# Patient Record
Sex: Female | Born: 1960
Health system: Southern US, Community
[De-identification: ages and names within clinical notes are randomized; demographics above are authoritative.]

## PROBLEM LIST (undated history)

## (undated) DIAGNOSIS — L719 Rosacea, unspecified: Secondary | ICD-10-CM

## (undated) DIAGNOSIS — I10 Essential (primary) hypertension: Secondary | ICD-10-CM

## (undated) DIAGNOSIS — E039 Hypothyroidism, unspecified: Secondary | ICD-10-CM

## (undated) DIAGNOSIS — E785 Hyperlipidemia, unspecified: Secondary | ICD-10-CM

## (undated) DIAGNOSIS — E079 Disorder of thyroid, unspecified: Secondary | ICD-10-CM

## (undated) DIAGNOSIS — F419 Anxiety disorder, unspecified: Secondary | ICD-10-CM

## (undated) DIAGNOSIS — E119 Type 2 diabetes mellitus without complications: Secondary | ICD-10-CM

## (undated) HISTORY — PX: BREAST SURGERY: SHX581

## (undated) HISTORY — DX: Disorder of thyroid, unspecified: E07.9

## (undated) HISTORY — PX: APPENDECTOMY: SHX54

## (undated) HISTORY — PX: KNEE ARTHROSCOPY W/ MENISCAL REPAIR: SHX1877

## (undated) HISTORY — DX: Hypothyroidism, unspecified: E03.9

## (undated) HISTORY — PX: HERNIA REPAIR: SHX51

## (undated) HISTORY — DX: Anxiety disorder, unspecified: F41.9

## (undated) HISTORY — PX: ABDOMINAL HYSTERECTOMY: SHX81

## (undated) HISTORY — DX: Rosacea, unspecified: L71.9

## (undated) HISTORY — DX: Essential (primary) hypertension: I10

## (undated) HISTORY — DX: Hyperlipidemia, unspecified: E78.5

## (undated) HISTORY — DX: Type 2 diabetes mellitus without complications: E11.9

---

## 1998-04-16 DIAGNOSIS — E079 Disorder of thyroid, unspecified: Secondary | ICD-10-CM

## 1998-04-16 HISTORY — DX: Disorder of thyroid, unspecified: E07.9

## 2004-04-16 HISTORY — PX: BREAST BIOPSY: SHX20

## 2004-06-01 ENCOUNTER — Ambulatory Visit: Payer: Self-pay | Admitting: Family Medicine

## 2004-07-04 ENCOUNTER — Ambulatory Visit: Payer: Self-pay | Admitting: Family Medicine

## 2005-02-15 ENCOUNTER — Ambulatory Visit: Payer: Self-pay

## 2005-02-19 ENCOUNTER — Ambulatory Visit: Payer: Self-pay

## 2005-09-17 ENCOUNTER — Ambulatory Visit: Payer: Self-pay | Admitting: General Surgery

## 2006-03-22 ENCOUNTER — Ambulatory Visit: Payer: Self-pay | Admitting: General Surgery

## 2007-04-24 ENCOUNTER — Ambulatory Visit: Payer: Self-pay

## 2007-05-15 ENCOUNTER — Encounter: Payer: Self-pay | Admitting: Sports Medicine

## 2007-05-18 ENCOUNTER — Encounter: Payer: Self-pay | Admitting: Sports Medicine

## 2007-06-15 ENCOUNTER — Encounter: Payer: Self-pay | Admitting: Sports Medicine

## 2008-05-14 ENCOUNTER — Ambulatory Visit: Payer: Self-pay

## 2009-05-30 ENCOUNTER — Ambulatory Visit: Payer: Self-pay

## 2010-06-08 ENCOUNTER — Ambulatory Visit: Payer: Self-pay

## 2011-06-06 LAB — HM MAMMOGRAPHY: HM Mammogram: NORMAL

## 2011-06-12 ENCOUNTER — Ambulatory Visit: Payer: Self-pay

## 2011-09-03 ENCOUNTER — Encounter: Payer: Self-pay | Admitting: Internal Medicine

## 2011-09-03 ENCOUNTER — Ambulatory Visit (INDEPENDENT_AMBULATORY_CARE_PROVIDER_SITE_OTHER): Payer: BC Managed Care – PPO | Admitting: Internal Medicine

## 2011-09-03 VITALS — BP 142/65 | HR 90 | Temp 98.6°F | Resp 16 | Ht 62.25 in | Wt 177.5 lb

## 2011-09-03 DIAGNOSIS — R6889 Other general symptoms and signs: Secondary | ICD-10-CM

## 2011-09-03 DIAGNOSIS — M25519 Pain in unspecified shoulder: Secondary | ICD-10-CM

## 2011-09-03 DIAGNOSIS — R41840 Attention and concentration deficit: Secondary | ICD-10-CM | POA: Insufficient documentation

## 2011-09-03 DIAGNOSIS — M25512 Pain in left shoulder: Secondary | ICD-10-CM | POA: Insufficient documentation

## 2011-09-03 DIAGNOSIS — M25511 Pain in right shoulder: Secondary | ICD-10-CM

## 2011-09-03 DIAGNOSIS — E039 Hypothyroidism, unspecified: Secondary | ICD-10-CM

## 2011-09-03 MED ORDER — LEVOTHYROXINE SODIUM 100 MCG PO TABS: 100.0000 ug | ORAL_TABLET | Freq: Every day | ORAL | Status: DC

## 2011-09-03 NOTE — Assessment & Plan Note (Signed)
Will set up evaluation with orthopedics. Question if she may have rotator cuff tear.

## 2011-09-03 NOTE — Assessment & Plan Note (Signed)
Will change back to brand name Synthroid. Will plan to repeat TSH and free T4 in one month.

## 2011-09-03 NOTE — Assessment & Plan Note (Signed)
Symptoms are most consistent with ADD. We discussed potential cognitive testing. She would like to hold off on this for now. She will call if she decides to set up this testing.

## 2011-09-03 NOTE — Progress Notes (Signed)
Subjective:    Patient ID: Lindsay Mejia, female    DOB: [birth year], 51 y.o.   MRN: [id number]  HPI 51 year old female with history of hypertension and hypothyroidism presents to establish care. Her primary concern today is some difficulty with concentration. She questions whether her thyroid levels may be off. She brings labs from April 2013 which showed TSH of 0.55. At that time her free T4 and free T3 were normal. She notes some difficulty concentrating, disorganization, and fatigue. The fatigue is most prominent in the morning. She denies any focal symptoms such as chest pain or shortness of breath. She denies change in bowel habits. She otherwise reports she is feeling well.  She notes that back in February 2013, she fell while running at [employer]. She fell onto her left shoulder and upper arm. Since that time, she has had some pain in her shoulder and upper arm. She has also had some difficulty with abducting her arm. She reports that it's difficult to put shirts on or fix her hair. She has not sought care for this.  Outpatient Encounter Prescriptions as of 09/03/2011  Medication Sig Dispense Refill  . aspirin 81 MG tablet Take 81 mg by mouth daily.      Marland Kitchen doxycycline (ORACEA) 40 MG capsule Take 40 mg by mouth every morning.      . escitalopram (LEXAPRO) 10 MG tablet Take 10 mg by mouth daily.      . hydrochlorothiazide (HYDRODIURIL) 25 MG tablet Take 25 mg by mouth daily.      Marland Kitchen levothyroxine (SYNTHROID, LEVOTHROID) 100 MCG tablet Take 1 tablet (100 mcg total) by mouth daily.  30 tablet  3  . Multiple Vitamin (MULTIVITAMIN) tablet Take 1 tablet by mouth daily.      . Omega-3 Fatty Acids (FISH OIL) 1200 MG CAPS Take 1,200 mg by mouth daily.        Review of Systems  Constitutional: Positive for fatigue. Negative for fever, chills, appetite change and unexpected weight change.  HENT: Negative for ear pain, congestion, sore throat, trouble swallowing, neck pain, voice change and sinus  pressure.   Eyes: Negative for visual disturbance.  Respiratory: Negative for cough, shortness of breath, wheezing and stridor.   Cardiovascular: Negative for chest pain, palpitations and leg swelling.  Gastrointestinal: Negative for nausea, vomiting, abdominal pain, diarrhea, constipation, blood in stool, abdominal distention and anal bleeding.  Genitourinary: Negative for dysuria and flank pain.  Musculoskeletal: Negative for myalgias, arthralgias and gait problem.  Skin: Negative for color change and rash.  Neurological: Negative for dizziness and headaches.  Hematological: Negative for adenopathy. Does not bruise/bleed easily.  Psychiatric/Behavioral: Positive for decreased concentration. Negative for suicidal ideas, sleep disturbance and dysphoric mood. The patient is nervous/anxious and is hyperactive.    BP 142/65  Pulse 90  Temp(Src) 98.6 F (37 C) (Oral)  Resp 16  Ht 5' 2.25" (1.581 m)  Wt 177 lb 8 oz (80.513 kg)  BMI 32.20 kg/m2  SpO2 99%     Objective:   Physical Exam  Constitutional: She is oriented to person, place, and time. She appears well-developed and well-nourished. No distress.  HENT:  Head: Normocephalic and atraumatic.  Right Ear: External ear normal.  Left Ear: External ear normal.  Nose: Nose normal.  Mouth/Throat: Oropharynx is clear and moist. No oropharyngeal exudate.  Eyes: Conjunctivae are normal. Pupils are equal, round, and reactive to light. Right eye exhibits no discharge. Left eye exhibits no discharge. No scleral icterus.  Neck: Normal  range of motion. Neck supple. No tracheal deviation present. No thyromegaly present.  Cardiovascular: Normal rate, regular rhythm, normal heart sounds and intact distal pulses.  Exam reveals no gallop and no friction rub.   No murmur heard. Pulmonary/Chest: Effort normal and breath sounds normal. No respiratory distress. She has no wheezes. She has no rales. She exhibits no tenderness.  Abdominal: Soft. Bowel  sounds are normal. She exhibits no distension and no mass. There is no tenderness. There is no rebound and no guarding.  Musculoskeletal: Normal range of motion. She exhibits no edema and no tenderness.  Lymphadenopathy:    She has no cervical adenopathy.  Neurological: She is alert and oriented to person, place, and time. No cranial nerve deficit. She exhibits normal muscle tone. Coordination normal.  Skin: Skin is warm and dry. No rash noted. She is not diaphoretic. No erythema. No pallor.  Psychiatric: She has a normal mood and affect. Her behavior is normal. Judgment and thought content normal.          Assessment & Plan:

## 2011-09-27 ENCOUNTER — Ambulatory Visit: Payer: Self-pay | Admitting: Internal Medicine

## 2011-10-08 ENCOUNTER — Other Ambulatory Visit (INDEPENDENT_AMBULATORY_CARE_PROVIDER_SITE_OTHER): Payer: BC Managed Care – PPO | Admitting: *Deleted

## 2011-10-08 DIAGNOSIS — E039 Hypothyroidism, unspecified: Secondary | ICD-10-CM

## 2011-10-08 LAB — T4, FREE: Free T4: 1.02 ng/dL (ref 0.60–1.60)

## 2011-10-10 ENCOUNTER — Ambulatory Visit: Payer: BC Managed Care – PPO | Admitting: Internal Medicine

## 2011-10-22 ENCOUNTER — Other Ambulatory Visit: Payer: Self-pay | Admitting: *Deleted

## 2011-10-22 DIAGNOSIS — E039 Hypothyroidism, unspecified: Secondary | ICD-10-CM

## 2011-10-22 MED ORDER — LEVOTHYROXINE SODIUM 100 MCG PO TABS: 100.0000 ug | ORAL_TABLET | Freq: Every day | ORAL | Status: DC

## 2011-12-22 ENCOUNTER — Encounter: Payer: Self-pay | Admitting: Internal Medicine

## 2011-12-22 DIAGNOSIS — E039 Hypothyroidism, unspecified: Secondary | ICD-10-CM

## 2011-12-23 MED ORDER — LEVOTHYROXINE SODIUM 100 MCG PO TABS: 100.0000 ug | ORAL_TABLET | Freq: Every day | ORAL | Status: DC

## 2012-04-07 ENCOUNTER — Ambulatory Visit: Payer: BC Managed Care – PPO | Admitting: Internal Medicine

## 2012-04-23 ENCOUNTER — Ambulatory Visit: Payer: BC Managed Care – PPO | Admitting: Internal Medicine

## 2012-05-31 ENCOUNTER — Other Ambulatory Visit: Payer: Self-pay

## 2012-06-12 ENCOUNTER — Telehealth: Payer: Self-pay | Admitting: Internal Medicine

## 2012-06-12 NOTE — Telephone Encounter (Signed)
Msg sent to pt: We recently received labs from your OB/GYN. Labs show elevated blood sugar consistent with diabetes. Please schedule a follow up appointment to discuss.

## 2012-06-17 ENCOUNTER — Ambulatory Visit: Payer: BC Managed Care – PPO | Admitting: Internal Medicine

## 2012-06-17 LAB — HM MAMMOGRAPHY: HM Mammogram: NORMAL

## 2012-06-26 ENCOUNTER — Ambulatory Visit (INDEPENDENT_AMBULATORY_CARE_PROVIDER_SITE_OTHER): Payer: Self-pay | Admitting: Internal Medicine

## 2012-06-26 ENCOUNTER — Encounter: Payer: Self-pay | Admitting: Internal Medicine

## 2012-06-26 VITALS — BP 132/90 | HR 65 | Temp 98.8°F | Wt 186.0 lb

## 2012-06-26 DIAGNOSIS — E119 Type 2 diabetes mellitus without complications: Secondary | ICD-10-CM | POA: Insufficient documentation

## 2012-06-26 DIAGNOSIS — IMO0001 Reserved for inherently not codable concepts without codable children: Secondary | ICD-10-CM

## 2012-06-26 DIAGNOSIS — I10 Essential (primary) hypertension: Secondary | ICD-10-CM

## 2012-06-26 MED ORDER — LISINOPRIL-HYDROCHLOROTHIAZIDE 10-12.5 MG PO TABS: 1.0000 | ORAL_TABLET | Freq: Every day | ORAL | Status: DC

## 2012-06-26 MED ORDER — ESCITALOPRAM OXALATE 10 MG PO TABS: 10.0000 mg | ORAL_TABLET | Freq: Every day | ORAL | Status: DC

## 2012-06-26 MED ORDER — METFORMIN HCL 500 MG PO TABS: 500.0000 mg | ORAL_TABLET | Freq: Two times a day (BID) | ORAL | Status: DC

## 2012-06-26 MED ORDER — ONETOUCH ULTRA SYSTEM W/DEVICE KIT: 1.0000 | PACK | Freq: Once | Status: DC

## 2012-06-26 NOTE — Assessment & Plan Note (Signed)
BP Readings from Last 3 Encounters:  06/26/12 132/90  09/03/11 142/65   Blood pressure elevated on last 2 visits. Given new diagnosis of diabetes, will change to lisinopril hydrochlorothiazide. Patient will return to clinic next week to recheck creatinine and potassium. Followup in one month.

## 2012-06-26 NOTE — Patient Instructions (Signed)
1200 Calorie Diabetic Diet The 1200 calorie diabetic diet limits calories to 1200 each day. Following this diet and making healthy meal choices can help improve overall health. It controls blood glucose (sugar) levels and can also help lower blood pressure and cholesterol.  SERVING SIZES Measuring foods and serving sizes helps to make sure you are getting the right amount of food. The list below tells how big or small some common serving sizes are.   1 oz.........4 stacked dice.  3 oz.........Deck of cards.  1 tsp........Tip of little finger.  1 tbs........Thumb.  2 tbs........Golf ball.   cup.......Half of a fist.  1 cup........A fist. GUIDELINES FOR CHOOSING FOODS The goal of this diet is to eat a variety of foods and limit calories to 1200 each day. This can be done by choosing foods that are low in calories and fat. The diet also suggests eating small amounts of food frequently. Doing this helps control your blood glucose levels, so they do not get too high or too low. Each meal or snack may include a protein food source to help you feel more satisfied. Try to eat about the same amount of food around the same time each day. This includes weekend days, travel days, and days off work. Space your meals about 4 to 5 hours apart, and add a snack between them, if you wish.  For example, a daily food plan could include breakfast, a morning snack, lunch, dinner, and an evening snack. Healthy meals and snacks have different types of foods, including whole grains, vegetables, fruits, lean meats, poultry, fish, and dairy products. As you plan your meals, select a variety of foods. Choose from the bread and starch, vegetable, fruit, dairy, and meat/protein groups. Examples of foods from each group are listed below, with their suggested serving sizes. Use measuring cups and spoons to become familiar with what a healthy portion looks like. Bread and Starch Each serving equals 15 grams of  carbohydrate.  1 slice bread.   bagel.   cup cold cereal (unsweetened).   cup hot cereal or mashed potatoes.  1 small potato (size of a computer mouse).   cup cooked pasta or rice.   English muffin.  1 cup broth-based soup.  3 cups of popcorn.  4 to 6 whole-wheat crackers.   cup cooked beans, peas, or corn. Vegetables Each serving equals 5 grams of carbohydrate.   cup cooked vegetables.  1 cup raw vegetables.   cup tomato or vegetable juice. Fruit Each serving equals 15 grams of carbohydrate.  1 small apple or orange.  1  cup watermelon or strawberries.   cup applesauce (no sugar added).  2 tbs raisins.   banana.   cup canned fruit, packed in water or in its own juice.   cup unsweetened fruit juice. Dairy Each serving equals 12 to 15 grams of carbohydrate.  1 cup fat-free milk.  6 oz artificially sweetened yogurt or plain yogurt.  1 cup low-fat buttermilk.  1 cup soy milk.  1 cup almond milk. Meat/Protein  1 large egg.  2 to 3 oz meat, poultry, or fish.   cup low-fat cottage cheese.  1 tbs peanut butter.  1 oz low-fat cheese.   cup tuna, packed in water.   cup tofu. Fat  1 tsp oil.  1 tsp trans-fat-free margarine.  1 tsp butter.  1 tsp mayonnaise.  2 tbs avocado.  1 tbs salad dressing.  1 tbs cream cheese.  2 tbs sour cream. SAMPLE 1200 CALORIE   DIET PLAN Breakfast  1 cup fat-free milk (1 carb serving).  1 small orange (1 carb serving).  1 scrambled egg.  1 slice whole-wheat toast (1 carb serving). Lunch  Sandwich  2 slices whole-wheat bread (2 carb servings).  2 oz lean meat.  2 tsp reduced fat mayonnaise.  1 lettuce leaf.  2 slices tomato.  1 cup carrot sticks. Afternoon Snack  1 small apple (1 carb serving).  1 string cheese. Dinner  2 oz meat.  1 small baked potato (1 carb serving).  1 tsp trans-fat-free margarine.  1 cup steamed broccoli.  1 cup fat-free milk (1 carb  serving). Evening Snack   small banana (1 carb serving).  6 vanilla wafers (1 carb serving). MEAL PLAN You can use this worksheet to help you make a daily meal plan based on the 1200 calorie diabetic diet suggestions. If you are using this plan to help you control your blood glucose, you may interchange carbohydrate containing foods (dairy, starches, and fruits). Select a variety of fresh foods of varying colors and flavors. The total amount of carbohydrate in your meals or snacks is more important than making sure you include all of the food groups every time you eat. You can choose from approximately this many of the following foods to build your day's meals:  6 Starches.  3 Vegetables.  2 Fruits.  2 Dairy.  4 to 6 oz Meat/Protein.  Up to 3 Fats. Your dietician can use this worksheet to help you decide how many servings and which types of foods are right for you. BREAKFAST Food Group and Servings / Food Choice Starch _________________________________________________________ Dairy __________________________________________________________ Fruit ___________________________________________________________ Meat/Protein____________________________________________________ Fat ____________________________________________________________ LUNCH Food Group and Servings / Food Choice  Starch _________________________________________________________ Meat/Protein ___________________________________________________ Vegetables _____________________________________________________ Fruit __________________________________________________________ Dairy __________________________________________________________ Fat ____________________________________________________________ AFTERNOON SNACK Food Group and Servings / Food Choice Dairy __________________________________________________________ Fruit ___________________________________________________________ Starch  __________________________________________________________ Meat/Protein____________________________________________________ DINNER Food Group and Servings / Food Choice Starch _________________________________________________________ Meat/Protein ___________________________________________________ Dairy __________________________________________________________ Vegetables _____________________________________________________ Fruit __________________________________________________________ Fat ____________________________________________________________ EVENING SNACK Food Group and Servings / Food Choice Fruit ___________________________________________________________ Meat/Protein ____________________________________________________ Dairy __________________________________________________________ Starch __________________________________________________________ DAILY TOTALS Starches _________________________ Vegetables _______________________ Fruits ____________________________ Dairy ____________________________ Meat/Protein______________________ Fats _____________________________ Document Released: 10/23/2004 Document Revised: 06/25/2011 Document Reviewed: 02/17/2009 [employer] Patient Information 2013 [employer].  

## 2012-06-26 NOTE — Progress Notes (Signed)
Subjective:    Patient ID: Lindsay Mejia, female    DOB: 19-Jul-1960, 52 y.o.   MRN: [id number]  HPI  52 year old female with history of hypothyroidism and anxiety presents for followup. She recently had labs performed by her OB/GYN which showed elevated blood sugar and A1c of 7.6%. She has never been diagnosed with diabetes. She has never taken medication to lower her blood sugar. She does not check her blood sugar. She notes some dietary indiscretion. She has a strong family history of diabetes.  Outpatient Encounter Prescriptions as of 06/26/2012  Medication Sig Dispense Refill  . aspirin 81 MG tablet Take 81 mg by mouth daily.      Marland Kitchen escitalopram (LEXAPRO) 10 MG tablet Take 1 tablet (10 mg total) by mouth daily.  30 tablet  6  . levothyroxine (SYNTHROID, LEVOTHROID) 100 MCG tablet Take 1 tablet (100 mcg total) by mouth daily.  30 tablet  11  . Multiple Vitamin (MULTIVITAMIN) tablet Take 1 tablet by mouth daily.      . Omega-3 Fatty Acids (FISH OIL) 1200 MG CAPS Take 1,200 mg by mouth daily.      . [DISCONTINUED] escitalopram (LEXAPRO) 10 MG tablet Take 10 mg by mouth daily.      . [DISCONTINUED] hydrochlorothiazide (HYDRODIURIL) 25 MG tablet Take 25 mg by mouth daily.      . Blood Glucose Monitoring Suppl (ONE TOUCH ULTRA SYSTEM KIT) W/DEVICE KIT 1 kit by Does not apply route once.  1 each  0  . doxycycline (ORACEA) 40 MG capsule Take 40 mg by mouth every morning.      Marland Kitchen lisinopril-hydrochlorothiazide (PRINZIDE,ZESTORETIC) 10-12.5 MG per tablet Take 1 tablet by mouth daily.  30 tablet  3  . metFORMIN (GLUCOPHAGE) 500 MG tablet Take 1 tablet (500 mg total) by mouth 2 (two) times daily with a meal.  60 tablet  3   No facility-administered encounter medications on file as of 06/26/2012.   BP 132/90  Pulse 65  Temp(Src) 98.8 F (37.1 C) (Oral)  Wt 186 lb (84.369 kg)  BMI 33.75 kg/m2  SpO2 98%  Review of Systems  Constitutional: Negative for fever, chills, appetite change, fatigue and  unexpected weight change.  HENT: Negative for ear pain, congestion, sore throat, trouble swallowing, neck pain, voice change and sinus pressure.   Eyes: Negative for visual disturbance.  Respiratory: Negative for cough, shortness of breath, wheezing and stridor.   Cardiovascular: Negative for chest pain, palpitations and leg swelling.  Gastrointestinal: Negative for nausea, vomiting, abdominal pain, diarrhea, constipation, blood in stool, abdominal distention and anal bleeding.  Genitourinary: Negative for dysuria and flank pain.  Musculoskeletal: Negative for myalgias, arthralgias and gait problem.  Skin: Negative for color change and rash.  Neurological: Negative for dizziness and headaches.  Hematological: Negative for adenopathy. Does not bruise/bleed easily.  Psychiatric/Behavioral: Negative for suicidal ideas, sleep disturbance and dysphoric mood. The patient is not nervous/anxious.        Objective:   Physical Exam  Constitutional: She is oriented to person, place, and time. She appears well-developed and well-nourished. No distress.  HENT:  Head: Normocephalic and atraumatic.  Right Ear: External ear normal.  Left Ear: External ear normal.  Nose: Nose normal.  Mouth/Throat: Oropharynx is clear and moist. No oropharyngeal exudate.  Eyes: Conjunctivae are normal. Pupils are equal, round, and reactive to light. Right eye exhibits no discharge. Left eye exhibits no discharge. No scleral icterus.  Neck: Normal range of motion. Neck supple. No tracheal deviation present.  No thyromegaly present.  Cardiovascular: Normal rate, regular rhythm, normal heart sounds and intact distal pulses.  Exam reveals no gallop and no friction rub.   No murmur heard. Pulmonary/Chest: Effort normal and breath sounds normal. No respiratory distress. She has no wheezes. She has no rales. She exhibits no tenderness.  Musculoskeletal: Normal range of motion. She exhibits no edema and no tenderness.   Lymphadenopathy:    She has no cervical adenopathy.  Neurological: She is alert and oriented to person, place, and time. No cranial nerve deficit. She exhibits normal muscle tone. Coordination normal.  Skin: Skin is warm and dry. No rash noted. She is not diaphoretic. No erythema. No pallor.  Psychiatric: She has a normal mood and affect. Her behavior is normal. Judgment and thought content normal.          Assessment & Plan:

## 2012-06-26 NOTE — Assessment & Plan Note (Signed)
Patient recently had labs showing A1c of 7.6%. Consistent with diabetes. Discussed diabetic diet and limiting intake of processed carbohydrates. Discussed setting goal of exercise 40 minutes 3 times per week. Will start metformin 500 mg twice daily. Will start lisinopril. Will set up nutrition counseling and diabetes education through [facility].

## 2012-06-30 ENCOUNTER — Ambulatory Visit: Payer: Self-pay | Admitting: Obstetrics and Gynecology

## 2012-07-02 ENCOUNTER — Other Ambulatory Visit (INDEPENDENT_AMBULATORY_CARE_PROVIDER_SITE_OTHER): Payer: Managed Care, Other (non HMO)

## 2012-07-02 DIAGNOSIS — I10 Essential (primary) hypertension: Secondary | ICD-10-CM

## 2012-07-02 LAB — BASIC METABOLIC PANEL
CO2: 31 mEq/L (ref 19–32)
Calcium: 9.2 mg/dL (ref 8.4–10.5)
GFR: 77.32 mL/min (ref >60.00)
Sodium: 140 mEq/L (ref 135–145)

## 2012-08-06 ENCOUNTER — Ambulatory Visit (INDEPENDENT_AMBULATORY_CARE_PROVIDER_SITE_OTHER): Payer: Managed Care, Other (non HMO) | Admitting: Internal Medicine

## 2012-08-06 ENCOUNTER — Encounter: Payer: Self-pay | Admitting: Internal Medicine

## 2012-08-06 VITALS — BP 128/80 | HR 66 | Temp 98.7°F | Wt 184.0 lb

## 2012-08-06 DIAGNOSIS — J340 Abscess, furuncle and carbuncle of nose: Secondary | ICD-10-CM | POA: Insufficient documentation

## 2012-08-06 DIAGNOSIS — R209 Unspecified disturbances of skin sensation: Secondary | ICD-10-CM

## 2012-08-06 DIAGNOSIS — I10 Essential (primary) hypertension: Secondary | ICD-10-CM

## 2012-08-06 DIAGNOSIS — E663 Overweight: Secondary | ICD-10-CM | POA: Insufficient documentation

## 2012-08-06 DIAGNOSIS — J3489 Other specified disorders of nose and nasal sinuses: Secondary | ICD-10-CM

## 2012-08-06 DIAGNOSIS — E669 Obesity, unspecified: Secondary | ICD-10-CM

## 2012-08-06 DIAGNOSIS — R202 Paresthesia of skin: Secondary | ICD-10-CM | POA: Insufficient documentation

## 2012-08-06 MED ORDER — SAXAGLIPTIN HCL 2.5 MG PO TABS: 2.5000 mg | ORAL_TABLET | Freq: Every day | ORAL | Status: DC

## 2012-08-06 NOTE — Assessment & Plan Note (Signed)
BP Readings from Last 3 Encounters:  08/06/12 128/80  06/26/12 132/90  09/03/11 142/65   Blood pressure well-controlled on lisinopril. Will continue.

## 2012-08-06 NOTE — Assessment & Plan Note (Signed)
Wt Readings from Last 3 Encounters:  08/06/12 184 lb (83.462 kg)  06/26/12 186 lb (84.369 kg)  09/03/11 177 lb 8 oz (80.513 kg)   Encouraged patient to continue efforts at healthy diet and regular physical activity. Note that weight has decreased by 2 pounds since last visit.

## 2012-08-06 NOTE — Assessment & Plan Note (Signed)
Suspect that symptoms may be secondary to carpal tunnel syndrome. Recommend that she try wearing bilateral wrist braces at night. Will plan to check B12 level with labs next month. Patient will call if symptoms are worsening.

## 2012-08-06 NOTE — Progress Notes (Signed)
Subjective:    Patient ID: Lindsay Mejia, female    DOB: 09-07-1960, 52 y.o.   MRN: [id number]  HPI 52 year old female with history of diabetes, hypothyroidism, hypertension presents for followup. She brings record of her blood sugars which show most blood sugars between 100-130. She has been compliant with metformin. She is following a healthy diet and has lost 2 pounds. She is trying to increase physical activity.  She is concerned today about some numbness and tingling in her distal fingers. This most often occurs in the morning. It improves throughout the course of the day. During her diabetes education course she learned that this may be secondary to diabetes. She is also concerned she might have B12 deficiency. She does drink well water at her home. She does not have any numbness or tingling in her feet. She denies problems with balance or fatigue.  She is also concerned about recent ulceration in her left nare. She describes this as very painful. She had some redness over her nose and face initially but this has resolved. She denies fever or chills. She did not use any medication for this.    Outpatient Encounter Prescriptions as of 08/06/2012  Medication Sig Dispense Refill  . aspirin 81 MG tablet Take 81 mg by mouth daily.      . Blood Glucose Monitoring Suppl (ONE TOUCH ULTRA SYSTEM KIT) W/DEVICE KIT 1 kit by Does not apply route once.  1 each  0  . escitalopram (LEXAPRO) 10 MG tablet Take 1 tablet (10 mg total) by mouth daily.  30 tablet  6  . Glucosamine-Chondroitin (GLUCOSAMINE CHONDR COMPLEX PO) Take by mouth.      . levothyroxine (SYNTHROID, LEVOTHROID) 100 MCG tablet Take 1 tablet (100 mcg total) by mouth daily.  30 tablet  11  . lisinopril-hydrochlorothiazide (PRINZIDE,ZESTORETIC) 10-12.5 MG per tablet Take 1 tablet by mouth daily.  30 tablet  3  . metFORMIN (GLUCOPHAGE) 500 MG tablet Take 1 tablet (500 mg total) by mouth 2 (two) times daily with a meal.  60 tablet  3  .  Multiple Vitamin (MULTIVITAMIN) tablet Take 1 tablet by mouth daily.      . Omega-3 Fatty Acids (FISH OIL) 1200 MG CAPS Take 1,200 mg by mouth daily.      Marland Kitchen doxycycline (ORACEA) 40 MG capsule Take 40 mg by mouth every morning.      . saxagliptin HCl (ONGLYZA) 2.5 MG TABS tablet Take 1 tablet (2.5 mg total) by mouth daily.  30 tablet  3   No facility-administered encounter medications on file as of 08/06/2012.   BP 128/80  Pulse 66  Temp(Src) 98.7 F (37.1 C) (Oral)  Wt 184 lb (83.462 kg)  BMI 33.39 kg/m2  SpO2 97%  Review of Systems  Constitutional: Negative for fever, chills, appetite change, fatigue and unexpected weight change.  HENT: Negative for ear pain, congestion, sore throat, trouble swallowing, neck pain, voice change and sinus pressure.   Eyes: Negative for visual disturbance.  Respiratory: Negative for cough, shortness of breath, wheezing and stridor.   Cardiovascular: Negative for chest pain, palpitations and leg swelling.  Gastrointestinal: Negative for nausea, vomiting, abdominal pain, diarrhea, constipation, blood in stool, abdominal distention and anal bleeding.  Genitourinary: Negative for dysuria and flank pain.  Musculoskeletal: Negative for myalgias, arthralgias and gait problem.  Skin: Negative for color change and rash.  Neurological: Positive for numbness. Negative for dizziness, tremors, weakness and headaches.  Hematological: Negative for adenopathy. Does not bruise/bleed easily.  Psychiatric/Behavioral:  Negative for suicidal ideas, sleep disturbance and dysphoric mood. The patient is not nervous/anxious.        Objective:   Physical Exam  Constitutional: She is oriented to person, place, and time. She appears well-developed and well-nourished. No distress.  HENT:  Head: Normocephalic and atraumatic.  Right Ear: External ear normal.  Left Ear: External ear normal.  Nose: Nose normal.    Mouth/Throat: Oropharynx is clear and moist. No oropharyngeal  exudate.  Eyes: Conjunctivae are normal. Pupils are equal, round, and reactive to light. Right eye exhibits no discharge. Left eye exhibits no discharge. No scleral icterus.  Neck: Normal range of motion. Neck supple. No tracheal deviation present. No thyromegaly present.  Cardiovascular: Normal rate, regular rhythm, normal heart sounds and intact distal pulses.  Exam reveals no gallop and no friction rub.   No murmur heard. Pulmonary/Chest: Effort normal and breath sounds normal. No accessory muscle usage. Not tachypneic. No respiratory distress. She has no decreased breath sounds. She has no wheezes. She has no rhonchi. She has no rales. She exhibits no tenderness.  Musculoskeletal: Normal range of motion. She exhibits no edema and no tenderness.  Lymphadenopathy:    She has no cervical adenopathy.  Neurological: She is alert and oriented to person, place, and time. No cranial nerve deficit. She exhibits normal muscle tone. Coordination normal.  Skin: Skin is warm and dry. No rash noted. She is not diaphoretic. No erythema. No pallor.  Psychiatric: She has a normal mood and affect. Her behavior is normal. Judgment and thought content normal.          Assessment & Plan:

## 2012-08-06 NOTE — Assessment & Plan Note (Signed)
Most consistent with herpetic ulcer. However, will send nasal culture today to evaluate for MRSA>

## 2012-08-06 NOTE — Assessment & Plan Note (Signed)
Patient's report of blood sugars today show some improvement with most fasting sugars between 100-130. We discussed goal of fasting sugars 80-120. Will add Onglyza 2.5 mg daily. Discussed potential side effects from this medication including nausea. Patient will call if any problems develop. She will e-mail with blood sugar readings. Followup 4 A1c in one month.

## 2012-08-08 LAB — MRSA CULTURE

## 2012-08-10 ENCOUNTER — Encounter: Payer: Self-pay | Admitting: Internal Medicine

## 2012-08-11 ENCOUNTER — Telehealth: Payer: Self-pay | Admitting: *Deleted

## 2012-08-11 MED ORDER — SAXAGLIPTIN HCL 2.5 MG PO TABS: 2.5000 mg | ORAL_TABLET | Freq: Every day | ORAL | Status: DC

## 2012-08-11 MED ORDER — GENTAMICIN SULFATE 0.1 % EX OINT: TOPICAL_OINTMENT | Freq: Two times a day (BID) | CUTANEOUS | Status: DC

## 2012-08-11 NOTE — Telephone Encounter (Signed)
Message copied by Theola Sequin on Mon Aug 11, 2012 10:14 AM ------      Message from: Ronna Polio A      Created: Sun Aug 10, 2012  8:07 PM       Can you make sure pt received this message? Need to call in Gentamicin ointment topically bid x 10 days.            Nasal swab showed MRSA. I would recommend that we start topical gentamicin. If you have any history of boils or other skin lesions, then we should also set up consultation with infectious disease specialist. ------

## 2012-09-02 ENCOUNTER — Encounter: Payer: Self-pay | Admitting: Internal Medicine

## 2012-09-09 ENCOUNTER — Other Ambulatory Visit (INDEPENDENT_AMBULATORY_CARE_PROVIDER_SITE_OTHER): Payer: Managed Care, Other (non HMO)

## 2012-09-09 DIAGNOSIS — R209 Unspecified disturbances of skin sensation: Secondary | ICD-10-CM

## 2012-09-09 DIAGNOSIS — R202 Paresthesia of skin: Secondary | ICD-10-CM

## 2012-09-09 LAB — CBC WITH DIFFERENTIAL/PLATELET
Basophils Absolute: 0 10*3/uL (ref 0.0–0.1)
HCT: 42.2 % (ref 36.0–46.0)
Hemoglobin: 14.5 g/dL (ref 12.0–15.0)
Lymphs Abs: 2.1 10*3/uL (ref 0.7–4.0)
MCV: 89.9 fl (ref 78.0–100.0)
Monocytes Absolute: 0.6 10*3/uL (ref 0.1–1.0)
Neutro Abs: 5.7 10*3/uL (ref 1.4–7.7)
Platelets: 243 10*3/uL (ref 150.0–400.0)
RDW: 13.2 % (ref 11.5–14.6)

## 2012-09-09 LAB — COMPREHENSIVE METABOLIC PANEL
ALT: 31 U/L (ref 0–35)
AST: 23 U/L (ref 0–37)
Alkaline Phosphatase: 57 U/L (ref 39–117)
Creatinine, Ser: 0.8 mg/dL (ref 0.4–1.2)
Sodium: 138 mEq/L (ref 135–145)
Total Bilirubin: 0.8 mg/dL (ref 0.3–1.2)
Total Protein: 7.5 g/dL (ref 6.0–8.3)

## 2012-09-16 ENCOUNTER — Ambulatory Visit (INDEPENDENT_AMBULATORY_CARE_PROVIDER_SITE_OTHER): Payer: Managed Care, Other (non HMO) | Admitting: Internal Medicine

## 2012-09-16 ENCOUNTER — Encounter: Payer: Self-pay | Admitting: Internal Medicine

## 2012-09-16 VITALS — BP 120/84 | HR 69 | Temp 98.3°F | Wt 184.0 lb

## 2012-09-16 DIAGNOSIS — L719 Rosacea, unspecified: Secondary | ICD-10-CM | POA: Insufficient documentation

## 2012-09-16 DIAGNOSIS — M542 Cervicalgia: Secondary | ICD-10-CM | POA: Insufficient documentation

## 2012-09-16 MED ORDER — SAXAGLIPTIN HCL 2.5 MG PO TABS: 2.5000 mg | ORAL_TABLET | Freq: Every day | ORAL | Status: DC

## 2012-09-16 MED ORDER — METFORMIN HCL 500 MG PO TABS: 500.0000 mg | ORAL_TABLET | Freq: Two times a day (BID) | ORAL | Status: DC

## 2012-09-16 MED ORDER — DOXYCYCLINE 40 MG PO CPDR: 40.0000 mg | DELAYED_RELEASE_CAPSULE | ORAL | Status: DC

## 2012-09-16 NOTE — Assessment & Plan Note (Signed)
Recent exacerbation of rosacea. Will restart Oracea. Encouraged her to avoid sun exposure while on this medication. Follow up 3 months and prn.

## 2012-09-16 NOTE — Assessment & Plan Note (Signed)
Lab Results  Component Value Date   HGBA1C 6.3 09/09/2012   Blood sugars much better controlled with current diet and medication. Will continue current medicine.  Plan repeat A1c in 3 months.

## 2012-09-16 NOTE — Progress Notes (Signed)
Subjective:    Patient ID: Lindsay Mejia, female    DOB: 01/19/1961, 52 y.o.   MRN: [id number]  HPI 52 year old female with history of diabetes, hypothyroidism, rosacea presents for followup. Blood sugars have been much improved compared to previous. A1c was 6.3% down from 7.6%. Patient is trying to follow a healthy diet. Her weight has been stable.  She is concerned about progressive symptoms of rosacea. She has recently been out of doxycycline. She notes red bumps which induced clear fluid over her cheeks and on her chin. She has used doxycycline in the past with improvement.  She is also concerned about neck pain. She reports she recently started using a new pillow and in the mornings her neck feels stiff and tight. This resolves throughout the day and with use of Advil.  Outpatient Encounter Prescriptions as of 09/16/2012  Medication Sig Dispense Refill  . aspirin 81 MG tablet Take 81 mg by mouth daily.      . Blood Glucose Monitoring Suppl (ONE TOUCH ULTRA SYSTEM KIT) W/DEVICE KIT 1 kit by Does not apply route once.  1 each  0  . escitalopram (LEXAPRO) 10 MG tablet Take 1 tablet (10 mg total) by mouth daily.  30 tablet  6  . gentamicin ointment (GARAMYCIN) 0.1 % Apply topically 2 (two) times daily.  15 g  0  . Glucosamine-Chondroitin (GLUCOSAMINE CHONDR COMPLEX PO) Take by mouth.      . levothyroxine (SYNTHROID, LEVOTHROID) 100 MCG tablet Take 1 tablet (100 mcg total) by mouth daily.  30 tablet  11  . lisinopril-hydrochlorothiazide (PRINZIDE,ZESTORETIC) 10-12.5 MG per tablet Take 1 tablet by mouth daily.  30 tablet  3  . metFORMIN (GLUCOPHAGE) 500 MG tablet Take 1 tablet (500 mg total) by mouth 2 (two) times daily with a meal.  60 tablet  11  . Multiple Vitamin (MULTIVITAMIN) tablet Take 1 tablet by mouth daily.      . Omega-3 Fatty Acids (FISH OIL) 1200 MG CAPS Take 1,200 mg by mouth daily.      . saxagliptin HCl (ONGLYZA) 2.5 MG TABS tablet Take 1 tablet (2.5 mg total) by mouth daily.   30 tablet  11  . [DISCONTINUED] metFORMIN (GLUCOPHAGE) 500 MG tablet Take 1 tablet (500 mg total) by mouth 2 (two) times daily with a meal.  60 tablet  3  . [DISCONTINUED] saxagliptin HCl (ONGLYZA) 2.5 MG TABS tablet Take 1 tablet (2.5 mg total) by mouth daily.  30 tablet  3  . doxycycline (ORACEA) 40 MG capsule Take 1 capsule (40 mg total) by mouth every morning.  30 capsule  11  . [DISCONTINUED] doxycycline (ORACEA) 40 MG capsule Take 40 mg by mouth every morning.       No facility-administered encounter medications on file as of 09/16/2012.   BP 120/84  Pulse 69  Temp(Src) 98.3 F (36.8 C) (Oral)  Wt 184 lb (83.462 kg)  BMI 33.39 kg/m2  SpO2 98%  Review of Systems  Constitutional: Negative for fever, chills, appetite change, fatigue and unexpected weight change.  HENT: Positive for neck pain and neck stiffness. Negative for ear pain, congestion, sore throat, trouble swallowing, voice change and sinus pressure.   Eyes: Negative for visual disturbance.  Respiratory: Negative for cough, shortness of breath, wheezing and stridor.   Cardiovascular: Negative for chest pain, palpitations and leg swelling.  Gastrointestinal: Negative for nausea, vomiting, abdominal pain, diarrhea, constipation, blood in stool, abdominal distention and anal bleeding.  Genitourinary: Negative for dysuria and flank pain.  Musculoskeletal: Negative for myalgias, arthralgias and gait problem.  Skin: Negative for color change and rash.  Neurological: Negative for dizziness and headaches.  Hematological: Negative for adenopathy. Does not bruise/bleed easily.  Psychiatric/Behavioral: Negative for suicidal ideas, sleep disturbance and dysphoric mood. The patient is not nervous/anxious.        Objective:   Physical Exam  Constitutional: She is oriented to person, place, and time. She appears well-developed and well-nourished. No distress.  HENT:  Head: Normocephalic and atraumatic.  Right Ear: External ear  normal.  Left Ear: External ear normal.  Nose: Nose normal.  Mouth/Throat: Oropharynx is clear and moist. No oropharyngeal exudate.  Eyes: Conjunctivae are normal. Pupils are equal, round, and reactive to light. Right eye exhibits no discharge. Left eye exhibits no discharge. No scleral icterus.  Neck: Normal range of motion. Neck supple. No tracheal deviation present. No thyromegaly present.  Cardiovascular: Normal rate, regular rhythm, normal heart sounds and intact distal pulses.  Exam reveals no gallop and no friction rub.   No murmur heard. Pulmonary/Chest: Effort normal and breath sounds normal. No accessory muscle usage. Not tachypneic. No respiratory distress. She has no decreased breath sounds. She has no wheezes. She has no rhonchi. She has no rales. She exhibits no tenderness.  Musculoskeletal: Normal range of motion. She exhibits no edema and no tenderness.       Cervical back: She exhibits tenderness, pain and spasm. She exhibits normal range of motion and no bony tenderness.  Lymphadenopathy:    She has no cervical adenopathy.  Neurological: She is alert and oriented to person, place, and time. No cranial nerve deficit. She exhibits normal muscle tone. Coordination normal.  Skin: Skin is warm and dry. Rash noted. Rash is maculopapular (over cheeks and chin). She is not diaphoretic. No erythema. No pallor.  Psychiatric: She has a normal mood and affect. Her behavior is normal. Judgment and thought content normal.          Assessment & Plan:

## 2012-09-16 NOTE — Assessment & Plan Note (Signed)
Secondary to muscular strain likely related to use of new pillow. Encouraged use of Ibuprofen 800mg  po tid prn. Ice may also help if applied to the area 10-72min 2-3 times daily. Pt will call if no improvement.

## 2012-09-25 ENCOUNTER — Encounter: Payer: Self-pay | Admitting: Internal Medicine

## 2012-10-21 ENCOUNTER — Other Ambulatory Visit: Payer: Self-pay | Admitting: Internal Medicine

## 2012-10-24 ENCOUNTER — Ambulatory Visit: Payer: Self-pay | Admitting: Unknown Physician Specialty

## 2012-11-11 ENCOUNTER — Telehealth: Payer: Self-pay | Admitting: Internal Medicine

## 2012-11-19 NOTE — Telephone Encounter (Signed)
Close encounter 

## 2012-12-16 ENCOUNTER — Other Ambulatory Visit (INDEPENDENT_AMBULATORY_CARE_PROVIDER_SITE_OTHER): Payer: Managed Care, Other (non HMO)

## 2012-12-16 LAB — COMPREHENSIVE METABOLIC PANEL
BUN: 17 mg/dL (ref 6–23)
CO2: 31 mEq/L (ref 19–32)
Calcium: 9.1 mg/dL (ref 8.4–10.5)
Chloride: 102 mEq/L (ref 96–112)
Creatinine, Ser: 0.8 mg/dL (ref 0.4–1.2)
GFR: 83 mL/min (ref >60.00)
Glucose, Bld: 143 mg/dL — ABNORMAL HIGH (ref 70–99)

## 2012-12-16 LAB — LIPID PANEL
Cholesterol: 161 mg/dL (ref 0–200)
LDL Cholesterol: 91 mg/dL (ref 0–99)
Total CHOL/HDL Ratio: 4
VLDL: 31.2 mg/dL (ref 0.0–40.0)

## 2012-12-18 ENCOUNTER — Encounter: Payer: Self-pay | Admitting: Internal Medicine

## 2012-12-18 ENCOUNTER — Ambulatory Visit (INDEPENDENT_AMBULATORY_CARE_PROVIDER_SITE_OTHER): Payer: Managed Care, Other (non HMO) | Admitting: Internal Medicine

## 2012-12-18 VITALS — BP 118/78 | HR 67 | Temp 98.6°F | Ht 63.0 in | Wt 185.0 lb

## 2012-12-18 DIAGNOSIS — I10 Essential (primary) hypertension: Secondary | ICD-10-CM

## 2012-12-18 DIAGNOSIS — IMO0001 Reserved for inherently not codable concepts without codable children: Secondary | ICD-10-CM

## 2012-12-18 DIAGNOSIS — E669 Obesity, unspecified: Secondary | ICD-10-CM

## 2012-12-18 DIAGNOSIS — L719 Rosacea, unspecified: Secondary | ICD-10-CM

## 2012-12-18 DIAGNOSIS — Z23 Encounter for immunization: Secondary | ICD-10-CM

## 2012-12-18 LAB — MICROALBUMIN / CREATININE URINE RATIO: Creatinine,U: 96.2 mg/dL

## 2012-12-18 MED ORDER — DOXYCYCLINE HYCLATE 100 MG PO TABS: 100.0000 mg | ORAL_TABLET | Freq: Every day | ORAL | Status: DC

## 2012-12-18 NOTE — Assessment & Plan Note (Signed)
Wt Readings from Last 3 Encounters:  12/18/12 185 lb (83.915 kg)  09/16/12 184 lb (83.462 kg)  08/06/12 184 lb (83.462 kg)   Encouraged continued effort at healthy diet and walking, as she is doing.

## 2012-12-18 NOTE — Assessment & Plan Note (Signed)
BP Readings from Last 3 Encounters:  12/18/12 118/78  09/16/12 120/84  08/06/12 128/80   BP well controlled on current medication. Will continue.

## 2012-12-18 NOTE — Assessment & Plan Note (Addendum)
Excellent control of BG with A1c of 6.3%. Encouraged continued effort at healthy diet and regular physical activity. Continue current medications. Foot exam normal today.  We discussed the risks and benefits of starting statin medication, given that she is diabetic. She used Lipitor in the past but medication was stopped because of low LDL (by another provider). LDL now 90. Will continue management with diet and exercise for now.

## 2012-12-18 NOTE — Progress Notes (Signed)
Subjective:    Patient ID: Lindsay Mejia, female    DOB: October 02, 1960, 52 y.o.   MRN: [id number]  HPI 52YO female with obesity, DM, HTN presents for follow up. Doing well. BG well controlled with current meds. A1c 6.3% recently. No elevated BG>200 or low BG <70. Trying to follow a healthy diet. Exercising by walking 3-4 miles 3x per week. No new concerns today.  Outpatient Encounter Prescriptions as of 12/18/2012  Medication Sig Dispense Refill  . aspirin 81 MG tablet Take 81 mg by mouth daily.      . Blood Glucose Monitoring Suppl (ONE TOUCH ULTRA SYSTEM KIT) W/DEVICE KIT 1 kit by Does not apply route once.  1 each  0  . escitalopram (LEXAPRO) 10 MG tablet Take 1 tablet (10 mg total) by mouth daily.  30 tablet  6  . Glucosamine-Chondroitin (GLUCOSAMINE CHONDR COMPLEX PO) Take by mouth.      . levothyroxine (SYNTHROID, LEVOTHROID) 100 MCG tablet Take 1 tablet (100 mcg total) by mouth daily.  30 tablet  11  . lisinopril-hydrochlorothiazide (PRINZIDE,ZESTORETIC) 10-12.5 MG per tablet TAKE 1 TABLET BY MOUTH EVERY DAY  30 tablet  5  . metFORMIN (GLUCOPHAGE) 500 MG tablet Take 1 tablet (500 mg total) by mouth 2 (two) times daily with a meal.  60 tablet  11  . Multiple Vitamin (MULTIVITAMIN) tablet Take 1 tablet by mouth daily.      . Omega-3 Fatty Acids (FISH OIL) 1200 MG CAPS Take 1,200 mg by mouth daily.      . saxagliptin HCl (ONGLYZA) 2.5 MG TABS tablet Take 1 tablet (2.5 mg total) by mouth daily.  30 tablet  11  . [DISCONTINUED] doxycycline (ORACEA) 40 MG capsule Take 1 capsule (40 mg total) by mouth every morning.  30 capsule  11  . [DISCONTINUED] gentamicin ointment (GARAMYCIN) 0.1 % Apply topically 2 (two) times daily.  15 g  0  . doxycycline (VIBRA-TABS) 100 MG tablet Take 1 tablet (100 mg total) by mouth daily.  30 tablet  1   No facility-administered encounter medications on file as of 12/18/2012.   BP 118/78  Pulse 67  Temp(Src) 98.6 F (37 C) (Oral)  Ht 5\' 3"  (1.6 m)  Wt 185 lb  (83.915 kg)  BMI 32.78 kg/m2  SpO2 96%  Review of Systems  Constitutional: Negative for fever, chills, appetite change, fatigue and unexpected weight change.  HENT: Negative for ear pain, congestion, sore throat, trouble swallowing, neck pain, voice change and sinus pressure.   Eyes: Negative for visual disturbance.  Respiratory: Negative for cough, shortness of breath, wheezing and stridor.   Cardiovascular: Negative for chest pain, palpitations and leg swelling.  Gastrointestinal: Negative for nausea, vomiting, abdominal pain, diarrhea, constipation, blood in stool, abdominal distention and anal bleeding.  Genitourinary: Negative for dysuria and flank pain.  Musculoskeletal: Negative for myalgias, arthralgias and gait problem.  Skin: Negative for color change and rash.  Neurological: Negative for dizziness and headaches.  Hematological: Negative for adenopathy. Does not bruise/bleed easily.  Psychiatric/Behavioral: Negative for suicidal ideas, sleep disturbance and dysphoric mood. The patient is not nervous/anxious.        Objective:   Physical Exam  Constitutional: She is oriented to person, place, and time. She appears well-developed and well-nourished. No distress.  HENT:  Head: Normocephalic and atraumatic.  Right Ear: External ear normal.  Left Ear: External ear normal.  Nose: Nose normal.  Mouth/Throat: Oropharynx is clear and moist. No oropharyngeal exudate.  Eyes: Conjunctivae are normal.  Pupils are equal, round, and reactive to light. Right eye exhibits no discharge. Left eye exhibits no discharge. No scleral icterus.  Neck: Normal range of motion. Neck supple. No tracheal deviation present. No thyromegaly present.  Cardiovascular: Normal rate, regular rhythm, normal heart sounds and intact distal pulses.  Exam reveals no gallop and no friction rub.   No murmur heard. Pulmonary/Chest: Effort normal and breath sounds normal. No accessory muscle usage. Not tachypneic. No  respiratory distress. She has no decreased breath sounds. She has no wheezes. She has no rhonchi. She has no rales. She exhibits no tenderness.  Musculoskeletal: Normal range of motion. She exhibits no edema and no tenderness.  Lymphadenopathy:    She has no cervical adenopathy.  Neurological: She is alert and oriented to person, place, and time. No cranial nerve deficit. She exhibits normal muscle tone. Coordination normal.  Monofilament exam intact bilateral feet.   Skin: Skin is warm and dry. No rash noted. She is not diaphoretic. No erythema. No pallor.  Psychiatric: She has a normal mood and affect. Her behavior is normal. Judgment and thought content normal.          Assessment & Plan:

## 2012-12-18 NOTE — Assessment & Plan Note (Signed)
Unable to get insurance approval for Oracea. Will use regular doxycycline. Discussed risk of sunburn with this medication.

## 2012-12-18 NOTE — Addendum Note (Signed)
Addended by: Theola Sequin on: 12/18/2012 12:00 PM   Modules accepted: Orders

## 2012-12-29 ENCOUNTER — Other Ambulatory Visit: Payer: Self-pay | Admitting: Internal Medicine

## 2013-03-19 ENCOUNTER — Ambulatory Visit (INDEPENDENT_AMBULATORY_CARE_PROVIDER_SITE_OTHER): Payer: Managed Care, Other (non HMO) | Admitting: Internal Medicine

## 2013-03-19 ENCOUNTER — Encounter: Payer: Self-pay | Admitting: Internal Medicine

## 2013-03-19 VITALS — BP 114/82 | HR 72 | Temp 98.3°F | Wt 181.0 lb

## 2013-03-19 DIAGNOSIS — M7712 Lateral epicondylitis, left elbow: Secondary | ICD-10-CM

## 2013-03-19 DIAGNOSIS — E039 Hypothyroidism, unspecified: Secondary | ICD-10-CM

## 2013-03-19 DIAGNOSIS — IMO0001 Reserved for inherently not codable concepts without codable children: Secondary | ICD-10-CM

## 2013-03-19 DIAGNOSIS — I10 Essential (primary) hypertension: Secondary | ICD-10-CM

## 2013-03-19 DIAGNOSIS — M7710 Lateral epicondylitis, unspecified elbow: Secondary | ICD-10-CM

## 2013-03-19 LAB — TSH: TSH: 0.42 u[IU]/mL (ref 0.35–5.50)

## 2013-03-19 LAB — LIPID PANEL
HDL: 37 mg/dL — ABNORMAL LOW (ref >39.00)
Total CHOL/HDL Ratio: 5
Triglycerides: 196 mg/dL — ABNORMAL HIGH (ref 0.0–149.0)

## 2013-03-19 LAB — COMPREHENSIVE METABOLIC PANEL
ALT: 42 U/L — ABNORMAL HIGH (ref 0–35)
AST: 33 U/L (ref 0–37)
Albumin: 3.9 g/dL (ref 3.5–5.2)
Alkaline Phosphatase: 60 U/L (ref 39–117)
BUN: 12 mg/dL (ref 6–23)
Chloride: 101 mEq/L (ref 96–112)
Creatinine, Ser: 0.7 mg/dL (ref 0.4–1.2)
Potassium: 3.7 mEq/L (ref 3.5–5.1)
Sodium: 138 mEq/L (ref 135–145)

## 2013-03-19 LAB — LDL CHOLESTEROL, DIRECT: Direct LDL: 130.9 mg/dL

## 2013-03-19 LAB — MICROALBUMIN / CREATININE URINE RATIO
Creatinine,U: 154.8 mg/dL
Microalb Creat Ratio: 0.3 mg/g (ref 0.0–30.0)
Microalb, Ur: 0.5 mg/dL (ref 0.0–1.9)

## 2013-03-19 NOTE — Assessment & Plan Note (Signed)
BP Readings from Last 3 Encounters:  03/19/13 114/82  12/18/12 118/78  09/16/12 120/84   BP well controlled on current medication. Will check renal function with labs.

## 2013-03-19 NOTE — Assessment & Plan Note (Signed)
Symptomatically doing well. Will check TSH with labs today. Continue levothyroxine. 

## 2013-03-19 NOTE — Assessment & Plan Note (Signed)
Symptoms are consistent with left lateral epicondylitis. Discussed conservative measures including keeping arm in a sling, icing area, and use of NSAIDS. If no improvement, will consider NTG patch.

## 2013-03-19 NOTE — Progress Notes (Signed)
Subjective:    Patient ID: Lindsay Mejia, female    DOB: 06-24-1960, 52 y.o.   MRN: [id number]  HPI 52YO female with h/o DM, HTN, hypothyroidism presents for follow up. Generally doing well. BG well controlled, mostly near 130. Compliant with meds.   Concerned today about several weeks of left lateral elbow pain. Denies any known trauma to elbow. No swelling. Pain is worsened with increased movement of arm. Not taking any thing for pain. No weakness or numbness noted.  Outpatient Prescriptions Prior to Visit  Medication Sig Dispense Refill  . aspirin 81 MG tablet Take 81 mg by mouth daily.      . Blood Glucose Monitoring Suppl (ONE TOUCH ULTRA SYSTEM KIT) W/DEVICE KIT 1 kit by Does not apply route once.  1 each  0  . escitalopram (LEXAPRO) 10 MG tablet Take 1 tablet (10 mg total) by mouth daily.  30 tablet  6  . Glucosamine-Chondroitin (GLUCOSAMINE CHONDR COMPLEX PO) Take by mouth.      Marland Kitchen lisinopril-hydrochlorothiazide (PRINZIDE,ZESTORETIC) 10-12.5 MG per tablet TAKE 1 TABLET BY MOUTH EVERY DAY  30 tablet  5  . metFORMIN (GLUCOPHAGE) 500 MG tablet Take 1 tablet (500 mg total) by mouth 2 (two) times daily with a meal.  60 tablet  11  . Multiple Vitamin (MULTIVITAMIN) tablet Take 1 tablet by mouth daily.      . Omega-3 Fatty Acids (FISH OIL) 1200 MG CAPS Take 1,200 mg by mouth daily.      . saxagliptin HCl (ONGLYZA) 2.5 MG TABS tablet Take 1 tablet (2.5 mg total) by mouth daily.  30 tablet  11  . SYNTHROID 100 MCG tablet TAKE 1 TABLET BY MOUTH EVERY DAY  30 tablet  5  . doxycycline (VIBRA-TABS) 100 MG tablet Take 1 tablet (100 mg total) by mouth daily.  30 tablet  1   No facility-administered medications prior to visit.   BP 114/82  Pulse 72  Temp(Src) 98.3 F (36.8 C) (Oral)  Wt 181 lb (82.101 kg)  SpO2 98%  Review of Systems  Constitutional: Negative for fever, chills, appetite change, fatigue and unexpected weight change.  HENT: Negative for congestion, ear pain, sinus  pressure, sore throat, trouble swallowing and voice change.   Eyes: Negative for visual disturbance.  Respiratory: Negative for cough, shortness of breath, wheezing and stridor.   Cardiovascular: Negative for chest pain, palpitations and leg swelling.  Gastrointestinal: Negative for nausea, vomiting, abdominal pain, diarrhea, constipation, blood in stool, abdominal distention and anal bleeding.  Genitourinary: Negative for dysuria and flank pain.  Musculoskeletal: Positive for arthralgias and myalgias. Negative for gait problem and neck pain.  Skin: Negative for color change and rash.  Neurological: Negative for dizziness and headaches.  Hematological: Negative for adenopathy. Does not bruise/bleed easily.  Psychiatric/Behavioral: Negative for suicidal ideas, sleep disturbance and dysphoric mood. The patient is not nervous/anxious.        Objective:   Physical Exam  Constitutional: She is oriented to person, place, and time. She appears well-developed and well-nourished. No distress.  HENT:  Head: Normocephalic and atraumatic.  Right Ear: External ear normal.  Left Ear: External ear normal.  Nose: Nose normal.  Mouth/Throat: Oropharynx is clear and moist. No oropharyngeal exudate.  Eyes: Conjunctivae are normal. Pupils are equal, round, and reactive to light. Right eye exhibits no discharge. Left eye exhibits no discharge. No scleral icterus.  Neck: Normal range of motion. Neck supple. No tracheal deviation present. No thyromegaly present.  Cardiovascular: Normal rate,  regular rhythm, normal heart sounds and intact distal pulses.  Exam reveals no gallop and no friction rub.   No murmur heard. Pulmonary/Chest: Effort normal and breath sounds normal. No accessory muscle usage. Not tachypneic. No respiratory distress. She has no decreased breath sounds. She has no wheezes. She has no rhonchi. She has no rales. She exhibits no tenderness.  Musculoskeletal: Normal range of motion. She exhibits  no edema.       Left elbow: She exhibits normal range of motion and no swelling. Tenderness found. Lateral epicondyle tenderness noted.  Lymphadenopathy:    She has no cervical adenopathy.  Neurological: She is alert and oriented to person, place, and time. No cranial nerve deficit. She exhibits normal muscle tone. Coordination normal.  Skin: Skin is warm and dry. No rash noted. She is not diaphoretic. No erythema. No pallor.  Psychiatric: She has a normal mood and affect. Her behavior is normal. Judgment and thought content normal.          Assessment & Plan:

## 2013-03-19 NOTE — Assessment & Plan Note (Signed)
Pt reports good control of BG. Will check A1c with labs today. Continue current medications. Follow up 3-4 months.

## 2013-03-19 NOTE — Progress Notes (Signed)
Pre-visit discussion using our clinic review tool. No additional management support is needed unless otherwise documented below in the visit note.  

## 2013-04-26 ENCOUNTER — Other Ambulatory Visit: Payer: Self-pay | Admitting: Internal Medicine

## 2013-06-18 ENCOUNTER — Other Ambulatory Visit: Payer: Self-pay | Admitting: Internal Medicine

## 2013-06-23 LAB — HM MAMMOGRAPHY: HM Mammogram: NORMAL

## 2013-06-23 LAB — HM PAP SMEAR: HM PAP: NORMAL

## 2013-07-02 ENCOUNTER — Ambulatory Visit: Payer: Self-pay | Admitting: Obstetrics and Gynecology

## 2013-07-20 ENCOUNTER — Ambulatory Visit: Payer: Managed Care, Other (non HMO) | Admitting: Internal Medicine

## 2013-07-21 ENCOUNTER — Encounter: Payer: Self-pay | Admitting: Internal Medicine

## 2013-07-21 ENCOUNTER — Ambulatory Visit (INDEPENDENT_AMBULATORY_CARE_PROVIDER_SITE_OTHER): Payer: Managed Care, Other (non HMO) | Admitting: Internal Medicine

## 2013-07-21 VITALS — BP 104/70 | HR 69 | Temp 98.5°F | Resp 16 | Wt 170.0 lb

## 2013-07-21 DIAGNOSIS — M25519 Pain in unspecified shoulder: Secondary | ICD-10-CM

## 2013-07-21 DIAGNOSIS — E039 Hypothyroidism, unspecified: Secondary | ICD-10-CM

## 2013-07-21 DIAGNOSIS — I10 Essential (primary) hypertension: Secondary | ICD-10-CM

## 2013-07-21 DIAGNOSIS — E1165 Type 2 diabetes mellitus with hyperglycemia: Principal | ICD-10-CM

## 2013-07-21 DIAGNOSIS — IMO0001 Reserved for inherently not codable concepts without codable children: Secondary | ICD-10-CM

## 2013-07-21 DIAGNOSIS — M25512 Pain in left shoulder: Secondary | ICD-10-CM

## 2013-07-21 LAB — MICROALBUMIN / CREATININE URINE RATIO
Creatinine,U: 221.2 mg/dL
Microalb Creat Ratio: 0.2 mg/g (ref 0.0–30.0)
Microalb, Ur: 0.4 mg/dL (ref 0.0–1.9)

## 2013-07-21 LAB — LIPID PANEL
CHOL/HDL RATIO: 4
Cholesterol: 161 mg/dL (ref 0–200)
HDL: 39.1 mg/dL (ref >39.00)
LDL Cholesterol: 96 mg/dL (ref 0–99)
Triglycerides: 128 mg/dL (ref 0.0–149.0)
VLDL: 25.6 mg/dL (ref 0.0–40.0)

## 2013-07-21 LAB — COMPREHENSIVE METABOLIC PANEL
ALK PHOS: 64 U/L (ref 39–117)
ALT: 27 U/L (ref 0–35)
AST: 22 U/L (ref 0–37)
Albumin: 3.8 g/dL (ref 3.5–5.2)
BILIRUBIN TOTAL: 0.6 mg/dL (ref 0.3–1.2)
BUN: 11 mg/dL (ref 6–23)
CO2: 31 mEq/L (ref 19–32)
Calcium: 9.2 mg/dL (ref 8.4–10.5)
Chloride: 101 mEq/L (ref 96–112)
Creatinine, Ser: 0.7 mg/dL (ref 0.4–1.2)
GFR: 100.7 mL/min (ref >60.00)
Glucose, Bld: 90 mg/dL (ref 70–99)
Potassium: 3.9 mEq/L (ref 3.5–5.1)
SODIUM: 139 meq/L (ref 135–145)
TOTAL PROTEIN: 6.7 g/dL (ref 6.0–8.3)

## 2013-07-21 LAB — HEMOGLOBIN A1C: Hgb A1c MFr Bld: 5.6 % (ref 4.6–6.5)

## 2013-07-21 LAB — TSH: TSH: 0.25 u[IU]/mL — ABNORMAL LOW (ref 0.35–5.50)

## 2013-07-21 NOTE — Assessment & Plan Note (Signed)
Will check A1c with labs today. Encouraged compliance with healthy diet and regular exercise.

## 2013-07-21 NOTE — Progress Notes (Signed)
Pre visit review using our clinic review tool, if applicable. No additional management support is needed unless otherwise documented below in the visit note. 

## 2013-07-21 NOTE — Progress Notes (Signed)
Subjective:    Patient ID: Lindsay Mejia, female    DOB: 1960-05-13, 53 y.o.   MRN: [id number]  HPI 53YO female presents for follow up.  DM - Did not bring record of BG today. Notes some recent dietary indiscretion. Compliant with medication.  Left shoulder pain - >1 year of left shoulder pain after a fall. No improvement with NSAIDS, PT. Persistent symptoms of weakness and pain with abduction of left arm.  Stressful time for her recently, as her husband was diagnosed with recurrent melanoma.  Review of Systems  Constitutional: Negative for fever, chills, appetite change, fatigue and unexpected weight change.  HENT: Negative for congestion, ear pain, sinus pressure, sore throat, trouble swallowing and voice change.   Eyes: Negative for visual disturbance.  Respiratory: Negative for cough, shortness of breath, wheezing and stridor.   Cardiovascular: Negative for chest pain, palpitations and leg swelling.  Gastrointestinal: Negative for nausea, vomiting, abdominal pain, diarrhea, constipation, blood in stool, abdominal distention and anal bleeding.  Genitourinary: Negative for dysuria and flank pain.  Musculoskeletal: Positive for arthralgias. Negative for gait problem, myalgias and neck pain.  Skin: Negative for color change and rash.  Neurological: Negative for dizziness and headaches.  Hematological: Negative for adenopathy. Does not bruise/bleed easily.  Psychiatric/Behavioral: Negative for suicidal ideas, sleep disturbance and dysphoric mood. The patient is not nervous/anxious.        Objective:    BP 104/70  Pulse 69  Temp(Src) 98.5 F (36.9 C) (Oral)  Resp 16  Wt 170 lb (77.111 kg)  SpO2 96% Physical Exam  Constitutional: She is oriented to person, place, and time. She appears well-developed and well-nourished. No distress.  HENT:  Head: Normocephalic and atraumatic.  Right Ear: External ear normal.  Left Ear: External ear normal.  Nose: Nose normal.    Mouth/Throat: Oropharynx is clear and moist. No oropharyngeal exudate.  Eyes: Conjunctivae are normal. Pupils are equal, round, and reactive to light. Right eye exhibits no discharge. Left eye exhibits no discharge. No scleral icterus.  Neck: Normal range of motion. Neck supple. No tracheal deviation present. No thyromegaly present.  Cardiovascular: Normal rate, regular rhythm, normal heart sounds and intact distal pulses.  Exam reveals no gallop and no friction rub.   No murmur heard. Pulmonary/Chest: Effort normal and breath sounds normal. No accessory muscle usage. Not tachypneic. No respiratory distress. She has no decreased breath sounds. She has no wheezes. She has no rhonchi. She has no rales. She exhibits no tenderness.  Musculoskeletal: She exhibits no edema and no tenderness.       Left shoulder: She exhibits decreased range of motion, pain and decreased strength (limited exam due to pain). She exhibits no tenderness and no bony tenderness.  Lymphadenopathy:    She has no cervical adenopathy.  Neurological: She is alert and oriented to person, place, and time. No cranial nerve deficit. She exhibits normal muscle tone. Coordination normal.  Skin: Skin is warm and dry. No rash noted. She is not diaphoretic. No erythema. No pallor.  Psychiatric: She has a normal mood and affect. Her behavior is normal. Judgment and thought content normal.          Assessment & Plan:   Problem List Items Addressed This Visit   Essential hypertension, benign      BP Readings from Last 3 Encounters:  07/21/13 104/70  03/19/13 114/82  12/18/12 118/78   BP well controlled with current medications. Will check renal function with labs.    Hypothyroidism  Will check TSH with labs.    Relevant Orders      TSH   Left shoulder pain     Persistent left shoulder pain after fall >1 year ago. Limited strength on abduction. Will get MRI left shoulder for evaluation of rotator cuff tear.    Relevant  Orders      MR Shoulder Left Wo Contrast   Type II or unspecified type diabetes mellitus without mention of complication, uncontrolled - Primary     Will check A1c with labs today. Encouraged compliance with healthy diet and regular exercise.    Relevant Orders      Comprehensive metabolic panel      Hemoglobin A1c      Lipid panel      Microalbumin / creatinine urine ratio       Return in about 3 months (around 10/20/2013) for Recheck of Diabetes.

## 2013-07-21 NOTE — Assessment & Plan Note (Signed)
Will check TSH with labs. 

## 2013-07-21 NOTE — Assessment & Plan Note (Signed)
Persistent left shoulder pain after fall >1 year ago. Limited strength on abduction. Will get MRI left shoulder for evaluation of rotator cuff tear.

## 2013-07-21 NOTE — Assessment & Plan Note (Signed)
BP Readings from Last 3 Encounters:  07/21/13 104/70  03/19/13 114/82  12/18/12 118/78   BP well controlled with current medications. Will check renal function with labs.

## 2013-07-22 ENCOUNTER — Telehealth: Payer: Self-pay | Admitting: Internal Medicine

## 2013-07-22 ENCOUNTER — Other Ambulatory Visit: Payer: Self-pay | Admitting: Internal Medicine

## 2013-07-22 DIAGNOSIS — E039 Hypothyroidism, unspecified: Secondary | ICD-10-CM

## 2013-07-22 NOTE — Telephone Encounter (Signed)
Relevant patient education assigned to patient using Emmi. ° °

## 2013-07-24 ENCOUNTER — Telehealth: Payer: Self-pay | Admitting: Emergency Medicine

## 2013-07-24 DIAGNOSIS — M25519 Pain in unspecified shoulder: Secondary | ICD-10-CM

## 2013-07-24 NOTE — Telephone Encounter (Signed)
Is she willing to try physical therapy? If yes, we can get this set up then make the follow up for 5/8

## 2013-07-24 NOTE — Telephone Encounter (Signed)
The patient is aware of the apt.

## 2013-07-24 NOTE — Telephone Encounter (Signed)
Left message for pt to contact our office. Insurance has denied MRI. Pt needs to make a f/u to get started on the 6w MD advised therapy.

## 2013-07-24 NOTE — Telephone Encounter (Signed)
First available 30 min apt is not until 5/8. Can we fit her in sooner?

## 2013-07-24 NOTE — Telephone Encounter (Signed)
Spoke with patient and she is ok with trying physical therapy.

## 2013-07-27 ENCOUNTER — Encounter: Payer: Self-pay | Admitting: Internal Medicine

## 2013-08-04 ENCOUNTER — Telehealth: Payer: Self-pay

## 2013-08-04 NOTE — Telephone Encounter (Signed)
Relevant patient education assigned to patient using Emmi. ° °

## 2013-08-17 ENCOUNTER — Encounter: Payer: Self-pay | Admitting: Internal Medicine

## 2013-08-17 ENCOUNTER — Other Ambulatory Visit (INDEPENDENT_AMBULATORY_CARE_PROVIDER_SITE_OTHER): Payer: Managed Care, Other (non HMO)

## 2013-08-17 DIAGNOSIS — E039 Hypothyroidism, unspecified: Secondary | ICD-10-CM

## 2013-08-17 LAB — TSH: TSH: 0.33 u[IU]/mL — ABNORMAL LOW (ref 0.35–5.50)

## 2013-08-18 ENCOUNTER — Other Ambulatory Visit: Payer: Managed Care, Other (non HMO)

## 2013-08-18 MED ORDER — LEVOTHYROXINE SODIUM 88 MCG PO TABS: 88.0000 ug | ORAL_TABLET | Freq: Every day | ORAL | Status: DC

## 2013-08-19 ENCOUNTER — Other Ambulatory Visit: Payer: Managed Care, Other (non HMO)

## 2013-08-25 ENCOUNTER — Ambulatory Visit: Payer: Self-pay | Admitting: Orthopedic Surgery

## 2013-09-02 ENCOUNTER — Ambulatory Visit: Payer: Managed Care, Other (non HMO) | Admitting: Internal Medicine

## 2013-09-09 ENCOUNTER — Ambulatory Visit (INDEPENDENT_AMBULATORY_CARE_PROVIDER_SITE_OTHER): Payer: Managed Care, Other (non HMO) | Admitting: Internal Medicine

## 2013-09-09 ENCOUNTER — Encounter: Payer: Self-pay | Admitting: Internal Medicine

## 2013-09-09 VITALS — BP 102/72 | HR 76 | Temp 98.9°F | Ht 62.5 in | Wt 172.0 lb

## 2013-09-09 DIAGNOSIS — Z01818 Encounter for other preprocedural examination: Secondary | ICD-10-CM

## 2013-09-09 NOTE — Progress Notes (Signed)
Pre visit review using our clinic review tool, if applicable. No additional management support is needed unless otherwise documented below in the visit note. 

## 2013-09-09 NOTE — Progress Notes (Signed)
   Subjective:    Patient ID: Lindsay Mejia, female    DOB: 07/01/1960, 53 y.o.   MRN: [id number]  HPI 53YO female presents for pre-operative evaluation. Scheduled for left rotator cuff repair. No recent illnesses. Feeling well. No previous h/o reaction to anesthesia. No history of abnormal bleeding or bruising. Continues to have left shoulder pain, made worse by abduction of her arm.   Review of Systems  Constitutional: Negative for fever, chills, appetite change, fatigue and unexpected weight change.  HENT: Negative for congestion, ear pain, sinus pressure, sore throat, trouble swallowing and voice change.   Eyes: Negative for visual disturbance.  Respiratory: Negative for cough, shortness of breath, wheezing and stridor.   Cardiovascular: Negative for chest pain, palpitations and leg swelling.  Gastrointestinal: Negative for nausea, vomiting, abdominal pain, diarrhea, constipation, blood in stool, abdominal distention and anal bleeding.  Genitourinary: Negative for dysuria and flank pain.  Musculoskeletal: Positive for arthralgias and myalgias. Negative for gait problem and neck pain.  Skin: Negative for color change and rash.  Neurological: Negative for dizziness and headaches.  Hematological: Negative for adenopathy. Does not bruise/bleed easily.  Psychiatric/Behavioral: Negative for suicidal ideas, sleep disturbance and dysphoric mood. The patient is not nervous/anxious.        Objective:    BP 102/72  Pulse 76  Temp(Src) 98.9 F (37.2 C) (Oral)  Ht 5' 2.5" (1.588 m)  Wt 172 lb (78.019 kg)  BMI 30.94 kg/m2  SpO2 94% Physical Exam  Constitutional: She is oriented to person, place, and time. She appears well-developed and well-nourished. No distress.  HENT:  Head: Normocephalic and atraumatic.  Right Ear: External ear normal.  Left Ear: External ear normal.  Nose: Nose normal.  Mouth/Throat: Oropharynx is clear and moist. No oropharyngeal exudate.  Eyes: Conjunctivae  are normal. Pupils are equal, round, and reactive to light. Right eye exhibits no discharge. Left eye exhibits no discharge. No scleral icterus.  Neck: Normal range of motion. Neck supple. No tracheal deviation present. No thyromegaly present.  Cardiovascular: Normal rate, regular rhythm, normal heart sounds and intact distal pulses.  Exam reveals no gallop and no friction rub.   No murmur heard. Pulmonary/Chest: Effort normal and breath sounds normal. No accessory muscle usage. Not tachypneic. No respiratory distress. She has no decreased breath sounds. She has no wheezes. She has no rhonchi. She has no rales. She exhibits no tenderness.  Musculoskeletal: She exhibits no edema.       Left shoulder: She exhibits decreased range of motion, tenderness and pain. She exhibits no bony tenderness.  Lymphadenopathy:    She has no cervical adenopathy.  Neurological: She is alert and oriented to person, place, and time. No cranial nerve deficit. She exhibits normal muscle tone. Coordination normal.  Skin: Skin is warm and dry. No rash noted. She is not diaphoretic. No erythema. No pallor.  Psychiatric: She has a normal mood and affect. Her behavior is normal. Judgment and thought content normal.          Assessment & Plan:   Problem List Items Addressed This Visit     Unprioritized   Preop examination - Primary     Preop exam normal today except as noted. Pt would be considered low risk of perioperative cardiac events. Recommend to proceed with surgery. Reviewed labs from 07/2013.    Relevant Orders      EKG 12-Lead (Completed)       Return in about 4 weeks (around 10/07/2013) for Recheck.

## 2013-09-09 NOTE — Assessment & Plan Note (Addendum)
Preop exam normal today except as noted. Pt would be considered low risk of perioperative cardiac events. Recommend to proceed with surgery. Reviewed labs from 07/2013. Pt was instructed to stop Aspirin and Fish Oil  1 week prior to surgery.

## 2013-09-19 ENCOUNTER — Other Ambulatory Visit: Payer: Self-pay | Admitting: Internal Medicine

## 2013-10-09 ENCOUNTER — Ambulatory Visit (INDEPENDENT_AMBULATORY_CARE_PROVIDER_SITE_OTHER): Payer: Medicare HMO | Admitting: Podiatry

## 2013-10-09 ENCOUNTER — Encounter: Payer: Self-pay | Admitting: Podiatry

## 2013-10-09 ENCOUNTER — Ambulatory Visit (INDEPENDENT_AMBULATORY_CARE_PROVIDER_SITE_OTHER): Payer: Medicare HMO

## 2013-10-09 VITALS — BP 113/64 | HR 78 | Resp 16 | Ht 63.0 in | Wt 173.0 lb

## 2013-10-09 DIAGNOSIS — M779 Enthesopathy, unspecified: Secondary | ICD-10-CM

## 2013-10-09 MED ORDER — TRIAMCINOLONE ACETONIDE 10 MG/ML IJ SUSP
10.0000 mg | Freq: Once | INTRAMUSCULAR | Status: AC
  Administered 2013-10-09: 10 mg

## 2013-10-09 NOTE — Progress Notes (Signed)
   Subjective:    Patient ID: Lindsay Mejia, female    DOB: 09-Apr-1961, 53 y.o.   MRN: [id number]  HPI Comments: i have pain in the left foot. Its on the lateral side of my foot. The pain has been going on for 3 days. Its gotten worse. The mornings and after sitting is worse. It hurts to walk. i used ice and that's it.  Foot Pain      Review of Systems  Musculoskeletal:       Difficulty walking   All other systems reviewed and are negative.      Objective:   Physical Exam        Assessment & Plan:

## 2013-10-10 NOTE — Progress Notes (Signed)
Subjective:     Patient ID: Lindsay Mejia, female   DOB: 1961-02-21, 53 y.o.   MRN: [id number]  Foot Pain   patient points the outside of the left foot states it's been very tender and has been going on for a recent period of time that she cannot bear weight on the side of her foot and she is concerned about fracture   Review of Systems  All other systems reviewed and are negative.      Objective:   Physical Exam  Nursing note and vitals reviewed. Constitutional: She is oriented to person, place, and time.  Cardiovascular: Intact distal pulses.   Musculoskeletal: Normal range of motion.  Neurological: She is oriented to person, place, and time.  Skin: Skin is warm.   neurovascular status intact with muscle strength adequate and range of motion of the subtalar midtarsal joint within normal limits. Lateral side left foot at the base of fifth metatarsal it is very inflamed when pressed and there is no indications of muscle strength loss     Assessment:     Probable tendinitis lateral side left foot    Plan:     At this time did initial H&P and reviewed x-rays and injected the left peroneal insertion 3 mg Kenalog 5 of vesica Marcaine mixture advised on reduced activity and reappoint depending on symptoms

## 2013-10-16 ENCOUNTER — Ambulatory Visit: Payer: Medicare HMO | Admitting: Podiatry

## 2013-10-17 ENCOUNTER — Other Ambulatory Visit: Payer: Self-pay | Admitting: Internal Medicine

## 2013-10-26 ENCOUNTER — Ambulatory Visit: Payer: Managed Care, Other (non HMO) | Admitting: Internal Medicine

## 2013-10-28 ENCOUNTER — Encounter: Payer: Self-pay | Admitting: Internal Medicine

## 2013-11-17 ENCOUNTER — Ambulatory Visit: Payer: Managed Care, Other (non HMO) | Admitting: Internal Medicine

## 2013-11-23 ENCOUNTER — Encounter: Payer: Self-pay | Admitting: Internal Medicine

## 2013-11-23 ENCOUNTER — Ambulatory Visit (INDEPENDENT_AMBULATORY_CARE_PROVIDER_SITE_OTHER): Payer: Managed Care, Other (non HMO) | Admitting: Internal Medicine

## 2013-11-23 ENCOUNTER — Other Ambulatory Visit: Payer: Self-pay | Admitting: Internal Medicine

## 2013-11-23 VITALS — BP 102/70 | HR 72 | Temp 98.6°F | Ht 63.0 in | Wt 177.2 lb

## 2013-11-23 DIAGNOSIS — IMO0001 Reserved for inherently not codable concepts without codable children: Secondary | ICD-10-CM

## 2013-11-23 DIAGNOSIS — E669 Obesity, unspecified: Secondary | ICD-10-CM

## 2013-11-23 DIAGNOSIS — I10 Essential (primary) hypertension: Secondary | ICD-10-CM

## 2013-11-23 DIAGNOSIS — E1165 Type 2 diabetes mellitus with hyperglycemia: Principal | ICD-10-CM

## 2013-11-23 DIAGNOSIS — E039 Hypothyroidism, unspecified: Secondary | ICD-10-CM

## 2013-11-23 LAB — COMPREHENSIVE METABOLIC PANEL
ALBUMIN: 4 g/dL (ref 3.5–5.2)
ALT: 24 U/L (ref 0–35)
AST: 26 U/L (ref 0–37)
Alkaline Phosphatase: 69 U/L (ref 39–117)
BUN: 15 mg/dL (ref 6–23)
CO2: 30 meq/L (ref 19–32)
Calcium: 9.3 mg/dL (ref 8.4–10.5)
Chloride: 102 mEq/L (ref 96–112)
Creatinine, Ser: 0.8 mg/dL (ref 0.4–1.2)
GFR: 82.71 mL/min (ref >60.00)
Glucose, Bld: 89 mg/dL (ref 70–99)
POTASSIUM: 4 meq/L (ref 3.5–5.1)
Sodium: 140 mEq/L (ref 135–145)
Total Bilirubin: 0.9 mg/dL (ref 0.2–1.2)
Total Protein: 7.2 g/dL (ref 6.0–8.3)

## 2013-11-23 LAB — LIPID PANEL
CHOL/HDL RATIO: 4
Cholesterol: 193 mg/dL (ref 0–200)
HDL: 44 mg/dL (ref >39.00)
LDL Cholesterol: 112 mg/dL — ABNORMAL HIGH (ref 0–99)
NonHDL: 149
Triglycerides: 184 mg/dL — ABNORMAL HIGH (ref 0.0–149.0)
VLDL: 36.8 mg/dL (ref 0.0–40.0)

## 2013-11-23 LAB — TSH: TSH: 2.6 u[IU]/mL (ref 0.35–4.50)

## 2013-11-23 LAB — HEMOGLOBIN A1C: Hgb A1c MFr Bld: 5.8 % (ref 4.6–6.5)

## 2013-11-23 LAB — MICROALBUMIN / CREATININE URINE RATIO
CREATININE, U: 161.8 mg/dL
MICROALB UR: 0.3 mg/dL (ref 0.0–1.9)
MICROALB/CREAT RATIO: 0.2 mg/g (ref 0.0–30.0)

## 2013-11-23 LAB — T4, FREE: Free T4: 1.08 ng/dL (ref 0.60–1.60)

## 2013-11-23 MED ORDER — PHENTERMINE HCL 37.5 MG PO CAPS: 37.5000 mg | ORAL_CAPSULE | ORAL | Status: DC

## 2013-11-23 NOTE — Assessment & Plan Note (Signed)
Will check TSH with labs today. 

## 2013-11-23 NOTE — Progress Notes (Signed)
Subjective:    Patient ID: Lindsay Mejia, female    DOB: [birth year], 53 y.o.   MRN: [id number]  HPI 53YO female presents for follow up.  DM - Compliant with meds. Does not check BG.  Frustrated by weight gain. Walking 3 miles 4x per week or more. Follows healthy diet. PT twice weekly. No weight loss.  Review of Systems  Constitutional: Negative for fever, chills, appetite change, fatigue and unexpected weight change.  Eyes: Negative for visual disturbance.  Respiratory: Negative for shortness of breath.   Cardiovascular: Negative for chest pain and leg swelling.  Gastrointestinal: Negative for nausea, vomiting, abdominal pain, diarrhea and constipation.  Skin: Negative for color change and rash.  Hematological: Negative for adenopathy. Does not bruise/bleed easily.  Psychiatric/Behavioral: Negative for dysphoric mood. The patient is not nervous/anxious.        Objective:    BP 102/70  Pulse 72  Temp(Src) 98.6 F (37 C) (Oral)  Ht 5\' 3"  (1.6 m)  Wt 177 lb 4 oz (80.4 kg)  BMI 31.41 kg/m2  SpO2 95% Physical Exam  Constitutional: She is oriented to person, place, and time. She appears well-developed and well-nourished. No distress.  HENT:  Head: Normocephalic and atraumatic.  Right Ear: External ear normal.  Left Ear: External ear normal.  Nose: Nose normal.  Mouth/Throat: Oropharynx is clear and moist. No oropharyngeal exudate.  Eyes: Conjunctivae are normal. Pupils are equal, round, and reactive to light. Right eye exhibits no discharge. Left eye exhibits no discharge. No scleral icterus.  Neck: Normal range of motion. Neck supple. No tracheal deviation present. No thyromegaly present.  Cardiovascular: Normal rate, regular rhythm, normal heart sounds and intact distal pulses.  Exam reveals no gallop and no friction rub.   No murmur heard. Pulmonary/Chest: Effort normal and breath sounds normal. No accessory muscle usage. Not tachypneic. No respiratory distress. She has  no decreased breath sounds. She has no wheezes. She has no rhonchi. She has no rales. She exhibits no tenderness.  Musculoskeletal: Normal range of motion. She exhibits no edema and no tenderness.  Lymphadenopathy:    She has no cervical adenopathy.  Neurological: She is alert and oriented to person, place, and time. No cranial nerve deficit. She exhibits normal muscle tone. Coordination normal.  Skin: Skin is warm and dry. No rash noted. She is not diaphoretic. No erythema. No pallor.  Psychiatric: She has a normal mood and affect. Her behavior is normal. Judgment and thought content normal.          Assessment & Plan:   Problem List Items Addressed This Visit     Unprioritized   Essential hypertension, benign      BP Readings from Last 3 Encounters:  11/23/13 102/70  10/09/13 113/64  09/09/13 102/72   BP well controlled on current medication. Renal function with labs today.    Hypothyroidism     Will check TSH with labs today.    Relevant Orders      TSH      T4, free   Obesity (BMI 30-39.9)      Wt Readings from Last 3 Encounters:  11/23/13 177 lb 4 oz (80.4 kg)  10/09/13 173 lb (78.472 kg)  09/09/13 172 lb (78.019 kg)   Body mass index is 31.41 kg/(m^2). Encouraged healthy diet and exercise. Will start phentermine to help with appetite suppression. Discussed potential risks of this medication. Follow up in 4 weeks for weight and BP check.    Relevant Medications  phentermine capsule   Type II or unspecified type diabetes mellitus without mention of complication, uncontrolled - Primary      Lab Results  Component Value Date   HGBA1C 5.6 07/21/2013   Will recheck A1c with labs today.    Relevant Orders      Comprehensive metabolic panel      Hemoglobin A1c      Lipid panel      Microalbumin / creatinine urine ratio       Return in about 4 weeks (around 12/21/2013) for Recheck.

## 2013-11-23 NOTE — Progress Notes (Signed)
Pre visit review using our clinic review tool, if applicable. No additional management support is needed unless otherwise documented below in the visit note. 

## 2013-11-23 NOTE — Assessment & Plan Note (Signed)
Lab Results  Component Value Date   HGBA1C 5.6 07/21/2013   Will recheck A1c with labs today.

## 2013-11-23 NOTE — Patient Instructions (Signed)
Labs today.  Start Phentermine 37.5mg  daily in the morning.  Follow up in 4 weeks.

## 2013-11-23 NOTE — Assessment & Plan Note (Signed)
BP Readings from Last 3 Encounters:  11/23/13 102/70  10/09/13 113/64  09/09/13 102/72   BP well controlled on current medication. Renal function with labs today.

## 2013-11-23 NOTE — Assessment & Plan Note (Signed)
Wt Readings from Last 3 Encounters:  11/23/13 177 lb 4 oz (80.4 kg)  10/09/13 173 lb (78.472 kg)  09/09/13 172 lb (78.019 kg)   Body mass index is 31.41 kg/(m^2). Encouraged healthy diet and exercise. Will start phentermine to help with appetite suppression. Discussed potential risks of this medication. Follow up in 4 weeks for weight and BP check.

## 2013-12-17 ENCOUNTER — Other Ambulatory Visit: Payer: Self-pay | Admitting: Internal Medicine

## 2013-12-24 ENCOUNTER — Encounter: Payer: Self-pay | Admitting: Internal Medicine

## 2013-12-24 ENCOUNTER — Ambulatory Visit (INDEPENDENT_AMBULATORY_CARE_PROVIDER_SITE_OTHER): Payer: Managed Care, Other (non HMO) | Admitting: Internal Medicine

## 2013-12-24 VITALS — BP 110/70 | HR 82 | Temp 98.2°F | Ht 63.0 in | Wt 173.2 lb

## 2013-12-24 DIAGNOSIS — E669 Obesity, unspecified: Secondary | ICD-10-CM

## 2013-12-24 LAB — HM DIABETES FOOT EXAM: HM Diabetic Foot Exam: NORMAL

## 2013-12-24 MED ORDER — PHENTERMINE HCL 37.5 MG PO CAPS: 37.5000 mg | ORAL_CAPSULE | ORAL | Status: DC

## 2013-12-24 MED ORDER — ESCITALOPRAM OXALATE 10 MG PO TABS: 10.0000 mg | ORAL_TABLET | Freq: Every day | ORAL | Status: DC

## 2013-12-24 NOTE — Progress Notes (Signed)
Pre visit review using our clinic review tool, if applicable. No additional management support is needed unless otherwise documented below in the visit note. 

## 2013-12-24 NOTE — Assessment & Plan Note (Signed)
Wt Readings from Last 3 Encounters:  12/24/13 173 lb 4 oz (78.586 kg)  11/23/13 177 lb 4 oz (80.4 kg)  10/09/13 173 lb (78.472 kg)   Body mass index is 30.7 kg/(m^2). Congratulated pt on 4lb weight loss. Will continue phentermine to help with appetite suppression. Follow up in 3 months and prn.

## 2013-12-24 NOTE — Progress Notes (Signed)
   Subjective:    Patient ID: Lindsay Mejia, female    DOB: [birth year], 53 y.o.   MRN: [id number]  HPI 53 YO female presents for follow up.  Started on phentermine last visit. Having some constipation with medication. However, generally feeling well and has lost 4lbs. No other side effects noted.  Review of Systems  Constitutional: Negative for fever, chills, appetite change, fatigue and unexpected weight change.  Eyes: Negative for visual disturbance.  Respiratory: Negative for shortness of breath.   Cardiovascular: Negative for chest pain and leg swelling.  Gastrointestinal: Positive for constipation. Negative for nausea, vomiting, abdominal pain and diarrhea.  Skin: Negative for color change and rash.  Hematological: Negative for adenopathy. Does not bruise/bleed easily.  Psychiatric/Behavioral: Negative for dysphoric mood. The patient is not nervous/anxious.        Objective:    BP 110/70  Pulse 82  Temp(Src) 98.2 F (36.8 C) (Oral)  Ht 5\' 3"  (1.6 m)  Wt 173 lb 4 oz (78.586 kg)  BMI 30.70 kg/m2  SpO2 95% Physical Exam  Constitutional: She is oriented to person, place, and time. She appears well-developed and well-nourished. No distress.  HENT:  Head: Normocephalic and atraumatic.  Right Ear: External ear normal.  Left Ear: External ear normal.  Nose: Nose normal.  Mouth/Throat: Oropharynx is clear and moist. No oropharyngeal exudate.  Eyes: Conjunctivae are normal. Pupils are equal, round, and reactive to light. Right eye exhibits no discharge. Left eye exhibits no discharge. No scleral icterus.  Neck: Normal range of motion. Neck supple. No tracheal deviation present. No thyromegaly present.  Cardiovascular: Normal rate, regular rhythm, normal heart sounds and intact distal pulses.  Exam reveals no gallop and no friction rub.   No murmur heard. Pulmonary/Chest: Effort normal and breath sounds normal. No accessory muscle usage. Not tachypneic. No respiratory distress.  She has no decreased breath sounds. She has no wheezes. She has no rhonchi. She has no rales. She exhibits no tenderness.  Musculoskeletal: Normal range of motion. She exhibits no edema and no tenderness.  Lymphadenopathy:    She has no cervical adenopathy.  Neurological: She is alert and oriented to person, place, and time. No cranial nerve deficit. She exhibits normal muscle tone. Coordination normal.  Skin: Skin is warm and dry. No rash noted. She is not diaphoretic. No erythema. No pallor.  Psychiatric: She has a normal mood and affect. Her behavior is normal. Judgment and thought content normal.          Assessment & Plan:   Problem List Items Addressed This Visit     Unprioritized   Obesity (BMI 30-39.9) - Primary      Wt Readings from Last 3 Encounters:  12/24/13 173 lb 4 oz (78.586 kg)  11/23/13 177 lb 4 oz (80.4 kg)  10/09/13 173 lb (78.472 kg)   Body mass index is 30.7 kg/(m^2). Congratulated pt on 4lb weight loss. Will continue phentermine to help with appetite suppression. Follow up in 3 months and prn.    Relevant Medications      phentermine capsule       Return in about 3 months (around 03/25/2014) for Recheck of Diabetes.

## 2013-12-24 NOTE — Patient Instructions (Signed)
Continue Phentermine.  Follow up in 3 months and as needed.

## 2014-02-08 ENCOUNTER — Other Ambulatory Visit: Payer: Self-pay | Admitting: Internal Medicine

## 2014-02-23 LAB — HM DIABETES EYE EXAM

## 2014-03-09 ENCOUNTER — Other Ambulatory Visit: Payer: Self-pay | Admitting: Internal Medicine

## 2014-03-25 ENCOUNTER — Encounter: Payer: Self-pay | Admitting: Internal Medicine

## 2014-03-25 ENCOUNTER — Ambulatory Visit (INDEPENDENT_AMBULATORY_CARE_PROVIDER_SITE_OTHER): Payer: Managed Care, Other (non HMO) | Admitting: Internal Medicine

## 2014-03-25 VITALS — BP 120/71 | HR 72 | Temp 98.7°F | Ht 63.0 in | Wt 171.2 lb

## 2014-03-25 DIAGNOSIS — E119 Type 2 diabetes mellitus without complications: Secondary | ICD-10-CM

## 2014-03-25 DIAGNOSIS — E669 Obesity, unspecified: Secondary | ICD-10-CM

## 2014-03-25 DIAGNOSIS — I10 Essential (primary) hypertension: Secondary | ICD-10-CM

## 2014-03-25 DIAGNOSIS — E039 Hypothyroidism, unspecified: Secondary | ICD-10-CM

## 2014-03-25 LAB — COMPREHENSIVE METABOLIC PANEL
ALBUMIN: 3.9 g/dL (ref 3.5–5.2)
ALT: 25 U/L (ref 0–35)
AST: 24 U/L (ref 0–37)
Alkaline Phosphatase: 64 U/L (ref 39–117)
BUN: 16 mg/dL (ref 6–23)
CHLORIDE: 102 meq/L (ref 96–112)
CO2: 29 mEq/L (ref 19–32)
Calcium: 9.1 mg/dL (ref 8.4–10.5)
Creatinine, Ser: 0.7 mg/dL (ref 0.4–1.2)
GFR: 86.48 mL/min (ref >60.00)
Glucose, Bld: 113 mg/dL — ABNORMAL HIGH (ref 70–99)
POTASSIUM: 4 meq/L (ref 3.5–5.1)
Sodium: 137 mEq/L (ref 135–145)
TOTAL PROTEIN: 7.3 g/dL (ref 6.0–8.3)
Total Bilirubin: 0.8 mg/dL (ref 0.2–1.2)

## 2014-03-25 LAB — LIPID PANEL
Cholesterol: 192 mg/dL (ref 0–200)
HDL: 42 mg/dL (ref >39.00)
LDL CALC: 121 mg/dL — AB (ref 0–99)
NONHDL: 150
Total CHOL/HDL Ratio: 5
Triglycerides: 145 mg/dL (ref 0.0–149.0)
VLDL: 29 mg/dL (ref 0.0–40.0)

## 2014-03-25 LAB — MICROALBUMIN / CREATININE URINE RATIO
Creatinine,U: 163.5 mg/dL
MICROALB UR: 1.5 mg/dL (ref 0.0–1.9)
Microalb Creat Ratio: 0.9 mg/g (ref 0.0–30.0)

## 2014-03-25 LAB — HEMOGLOBIN A1C: Hgb A1c MFr Bld: 5.9 % (ref 4.6–6.5)

## 2014-03-25 MED ORDER — ESCITALOPRAM OXALATE 10 MG PO TABS: 10.0000 mg | ORAL_TABLET | Freq: Every day | ORAL | Status: DC

## 2014-03-25 NOTE — Assessment & Plan Note (Signed)
Wt Readings from Last 3 Encounters:  03/25/14 171 lb 4 oz (77.678 kg)  12/24/13 173 lb 4 oz (78.586 kg)  11/23/13 177 lb 4 oz (80.4 kg)   Congratulated pt on weight loss. Encouraged continue healthy diet and exercise. Continue phentermine for appetite suppression.

## 2014-03-25 NOTE — Progress Notes (Signed)
Subjective:    Patient ID: Lindsay Mejia, female    DOB: 1960-08-07, 53 y.o.   MRN: [id number]  HPI 53YO female presents for follow up.  Continues to work on [employer] and exercise. Wt Readings from Last 3 Encounters:  03/25/14 171 lb 4 oz (77.678 kg)  12/24/13 173 lb 4 oz (78.586 kg)  11/23/13 177 lb 4 oz (80.4 kg)    DM - BG well controlled, typically around 130. Compliant with medications.  No new concerns today.   Past medical, surgical, family and social history per today's encounter.  Review of Systems  Constitutional: Negative for fever, chills, appetite change, fatigue and unexpected weight change.  Eyes: Negative for visual disturbance.  Respiratory: Negative for shortness of breath.   Cardiovascular: Negative for chest pain and leg swelling.  Gastrointestinal: Negative for abdominal pain, diarrhea and constipation.  Skin: Negative for color change and rash.  Hematological: Negative for adenopathy. Does not bruise/bleed easily.  Psychiatric/Behavioral: Negative for dysphoric mood. The patient is not nervous/anxious.        Objective:    BP 120/71 mmHg  Pulse 72  Temp(Src) 98.7 F (37.1 C) (Oral)  Ht 5\' 3"  (1.6 m)  Wt 171 lb 4 oz (77.678 kg)  BMI 30.34 kg/m2  SpO2 97% Physical Exam  Constitutional: She is oriented to person, place, and time. She appears well-developed and well-nourished. No distress.  HENT:  Head: Normocephalic and atraumatic.  Right Ear: External ear normal.  Left Ear: External ear normal.  Nose: Nose normal.  Mouth/Throat: Oropharynx is clear and moist. No oropharyngeal exudate.  Eyes: Conjunctivae are normal. Pupils are equal, round, and reactive to light. Right eye exhibits no discharge. Left eye exhibits no discharge. No scleral icterus.  Neck: Normal range of motion. Neck supple. No tracheal deviation present. No thyromegaly present.  Cardiovascular: Normal rate, regular rhythm, normal heart sounds and intact distal pulses.   Exam reveals no gallop and no friction rub.   No murmur heard. Pulmonary/Chest: Effort normal and breath sounds normal. No accessory muscle usage. No tachypnea. No respiratory distress. She has no decreased breath sounds. She has no wheezes. She has no rhonchi. She has no rales. She exhibits no tenderness.  Musculoskeletal: Normal range of motion. She exhibits no edema or tenderness.  Lymphadenopathy:    She has no cervical adenopathy.  Neurological: She is alert and oriented to person, place, and time. No cranial nerve deficit. She exhibits normal muscle tone. Coordination normal.  Skin: Skin is warm and dry. No rash noted. She is not diaphoretic. No erythema. No pallor.  Psychiatric: She has a normal mood and affect. Her behavior is normal. Judgment and thought content normal.          Assessment & Plan:   Problem List Items Addressed This Visit      Unprioritized   Diabetes type 2, controlled - Primary    Lab Results  Component Value Date   HGBA1C 5.8 11/23/2013   Will check A1c today with labs. Continue Metformin.    Relevant Orders      Comprehensive metabolic panel      Hemoglobin A1c      Lipid panel      Microalbumin / creatinine urine ratio   Essential hypertension, benign    BP Readings from Last 3 Encounters:  03/25/14 120/71  12/24/13 110/70  11/23/13 102/70   BP well controlled. Continue current medications. Check renal function with labs today.    Hypothyroidism  Obesity (BMI 30-39.9)    Wt Readings from Last 3 Encounters:  03/25/14 171 lb 4 oz (77.678 kg)  12/24/13 173 lb 4 oz (78.586 kg)  11/23/13 177 lb 4 oz (80.4 kg)   Congratulated pt on weight loss. Encouraged continue healthy diet and exercise. Continue phentermine for appetite suppression.        Return in about 3 months (around 06/24/2014) for Recheck of Diabetes.

## 2014-03-25 NOTE — Progress Notes (Signed)
Pre visit review using our clinic review tool, if applicable. No additional management support is needed unless otherwise documented below in the visit note. 

## 2014-03-25 NOTE — Assessment & Plan Note (Signed)
BP Readings from Last 3 Encounters:  03/25/14 120/71  12/24/13 110/70  11/23/13 102/70   BP well controlled. Continue current medications. Check renal function with labs today.

## 2014-03-25 NOTE — Patient Instructions (Signed)
Labs today.   Follow up in 3 months.  

## 2014-03-25 NOTE — Assessment & Plan Note (Signed)
Lab Results  Component Value Date   HGBA1C 5.8 11/23/2013   Will check A1c today with labs. Continue Metformin.

## 2014-04-17 ENCOUNTER — Other Ambulatory Visit: Payer: Self-pay | Admitting: Internal Medicine

## 2014-05-14 ENCOUNTER — Other Ambulatory Visit: Payer: Self-pay | Admitting: Internal Medicine

## 2014-06-14 ENCOUNTER — Other Ambulatory Visit: Payer: Self-pay | Admitting: Internal Medicine

## 2014-06-24 ENCOUNTER — Encounter: Payer: Self-pay | Admitting: Internal Medicine

## 2014-06-24 ENCOUNTER — Other Ambulatory Visit: Payer: Self-pay | Admitting: Internal Medicine

## 2014-06-24 ENCOUNTER — Ambulatory Visit (INDEPENDENT_AMBULATORY_CARE_PROVIDER_SITE_OTHER): Payer: Managed Care, Other (non HMO) | Admitting: Internal Medicine

## 2014-06-24 VITALS — BP 102/66 | HR 70 | Resp 12 | Ht 63.0 in | Wt 174.2 lb

## 2014-06-24 DIAGNOSIS — E039 Hypothyroidism, unspecified: Secondary | ICD-10-CM

## 2014-06-24 DIAGNOSIS — E119 Type 2 diabetes mellitus without complications: Secondary | ICD-10-CM

## 2014-06-24 DIAGNOSIS — E669 Obesity, unspecified: Secondary | ICD-10-CM

## 2014-06-24 DIAGNOSIS — I10 Essential (primary) hypertension: Secondary | ICD-10-CM

## 2014-06-24 DIAGNOSIS — E785 Hyperlipidemia, unspecified: Secondary | ICD-10-CM

## 2014-06-24 LAB — LIPID PANEL
CHOL/HDL RATIO: 5
CHOLESTEROL: 198 mg/dL (ref 0–200)
HDL: 42.6 mg/dL (ref >39.00)
LDL CALC: 129 mg/dL — AB (ref 0–99)
NONHDL: 155.4
Triglycerides: 133 mg/dL (ref 0.0–149.0)
VLDL: 26.6 mg/dL (ref 0.0–40.0)

## 2014-06-24 LAB — COMPREHENSIVE METABOLIC PANEL
ALT: 26 U/L (ref 0–35)
AST: 22 U/L (ref 0–37)
Albumin: 4.3 g/dL (ref 3.5–5.2)
Alkaline Phosphatase: 72 U/L (ref 39–117)
BILIRUBIN TOTAL: 0.6 mg/dL (ref 0.2–1.2)
BUN: 18 mg/dL (ref 6–23)
CALCIUM: 9.6 mg/dL (ref 8.4–10.5)
CO2: 32 mEq/L (ref 19–32)
CREATININE: 0.83 mg/dL (ref 0.40–1.20)
Chloride: 101 mEq/L (ref 96–112)
GFR: 75.69 mL/min (ref >60.00)
GLUCOSE: 107 mg/dL — AB (ref 70–99)
POTASSIUM: 3.9 meq/L (ref 3.5–5.1)
Sodium: 140 mEq/L (ref 135–145)
TOTAL PROTEIN: 7.1 g/dL (ref 6.0–8.3)

## 2014-06-24 LAB — TSH: TSH: 1.28 u[IU]/mL (ref 0.35–4.50)

## 2014-06-24 LAB — HM PAP SMEAR: HM Pap smear: NORMAL

## 2014-06-24 LAB — HEMOGLOBIN A1C: Hgb A1c MFr Bld: 6 % (ref 4.6–6.5)

## 2014-06-24 MED ORDER — ATORVASTATIN CALCIUM 10 MG PO TABS: 10.0000 mg | ORAL_TABLET | Freq: Every day | ORAL | Status: DC

## 2014-06-24 MED ORDER — SYNTHROID 88 MCG PO TABS: ORAL_TABLET | ORAL | Status: DC

## 2014-06-24 NOTE — Assessment & Plan Note (Signed)
Will check TSH with labs. Continue Levothyroxine. 

## 2014-06-24 NOTE — Progress Notes (Signed)
Subjective:    Patient ID: Lindsay Mejia, female    DOB: [birth year], 54 y.o.   MRN: [id number]  HPI  54YO female presents for follow up.  DM - Does not check BG. Continues to try to follow a healthy diet and exercise.  No concerns today.   Wt Readings from Last 3 Encounters:  06/24/14 174 lb 3.2 oz (79.017 kg)  03/25/14 171 lb 4 oz (77.678 kg)  12/24/13 173 lb 4 oz (78.586 kg)    Past medical, surgical, family and social history per today's encounter.  Review of Systems  Constitutional: Negative for fever, chills, appetite change, fatigue and unexpected weight change.  Eyes: Negative for visual disturbance.  Respiratory: Negative for shortness of breath.   Cardiovascular: Negative for chest pain and leg swelling.  Gastrointestinal: Negative for nausea, vomiting, abdominal pain, diarrhea and constipation.  Musculoskeletal: Negative for arthralgias.  Skin: Negative for color change and rash.  Hematological: Negative for adenopathy. Does not bruise/bleed easily.  Psychiatric/Behavioral: Negative for dysphoric mood. The patient is not nervous/anxious.        Objective:    BP 102/66 mmHg  Pulse 70  Resp 12  Ht 5\' 3"  (1.6 m)  Wt 174 lb 3.2 oz (79.017 kg)  BMI 30.87 kg/m2  SpO2 96% Physical Exam  Constitutional: She is oriented to person, place, and time. She appears well-developed and well-nourished. No distress.  HENT:  Head: Normocephalic and atraumatic.  Right Ear: External ear normal.  Left Ear: External ear normal.  Nose: Nose normal.  Mouth/Throat: Oropharynx is clear and moist. No oropharyngeal exudate.  Eyes: Conjunctivae are normal. Pupils are equal, round, and reactive to light. Right eye exhibits no discharge. Left eye exhibits no discharge. No scleral icterus.  Neck: Normal range of motion. Neck supple. No tracheal deviation present. No thyromegaly present.  Cardiovascular: Normal rate, regular rhythm, normal heart sounds and intact distal pulses.  Exam  reveals no gallop and no friction rub.   No murmur heard. Pulmonary/Chest: Effort normal and breath sounds normal. No respiratory distress. She has no wheezes. She has no rales. She exhibits no tenderness.  Musculoskeletal: Normal range of motion. She exhibits no edema or tenderness.  Lymphadenopathy:    She has no cervical adenopathy.  Neurological: She is alert and oriented to person, place, and time. No cranial nerve deficit. She exhibits normal muscle tone. Coordination normal.  Skin: Skin is warm and dry. No rash noted. She is not diaphoretic. No erythema. No pallor.  Psychiatric: She has a normal mood and affect. Her behavior is normal. Judgment and thought content normal.          Assessment & Plan:   Problem List Items Addressed This Visit      Unprioritized   Diabetes type 2, controlled - Primary    Will check A1c with labs. Continue Metformin and Onglyza.      Relevant Orders   Comprehensive metabolic panel   Hemoglobin A1c   Lipid panel   Essential hypertension, benign    BP Readings from Last 3 Encounters:  06/24/14 102/66  03/25/14 120/71  12/24/13 110/70   BP well controlled. Continue Lisionpril-HCTZ. Renal function with labs.      Hypothyroidism    Will check TSH with labs. Continue Levothyroxine.      Relevant Medications   SYNTHROID 88 MCG tablet   Other Relevant Orders   TSH   Obesity (BMI 30-39.9)    Wt Readings from Last 3 Encounters:  06/24/14 174  lb 3.2 oz (79.017 kg)  03/25/14 171 lb 4 oz (77.678 kg)  12/24/13 173 lb 4 oz (78.586 kg)   Encouraged continued efforts at healthy diet and exercise.          Return in about 6 months (around 12/25/2014) for Recheck of Diabetes.

## 2014-06-24 NOTE — Assessment & Plan Note (Signed)
BP Readings from Last 3 Encounters:  06/24/14 102/66  03/25/14 120/71  12/24/13 110/70   BP well controlled. Continue Lisionpril-HCTZ. Renal function with labs.

## 2014-06-24 NOTE — Assessment & Plan Note (Signed)
Wt Readings from Last 3 Encounters:  06/24/14 174 lb 3.2 oz (79.017 kg)  03/25/14 171 lb 4 oz (77.678 kg)  12/24/13 173 lb 4 oz (78.586 kg)   Encouraged continued efforts at healthy diet and exercise.

## 2014-06-24 NOTE — Progress Notes (Signed)
Pre visit review using our clinic review tool, if applicable. No additional management support is needed unless otherwise documented below in the visit note. 

## 2014-06-24 NOTE — Patient Instructions (Signed)
Labs today.  Follow up in 6 months. 

## 2014-06-24 NOTE — Assessment & Plan Note (Signed)
Will check A1c with labs. Continue Metformin and Onglyza.

## 2014-07-05 ENCOUNTER — Ambulatory Visit: Payer: Self-pay | Admitting: Obstetrics and Gynecology

## 2014-07-15 ENCOUNTER — Encounter: Payer: Self-pay | Admitting: Nurse Practitioner

## 2014-07-15 ENCOUNTER — Encounter: Payer: Self-pay | Admitting: Internal Medicine

## 2014-07-15 ENCOUNTER — Ambulatory Visit (INDEPENDENT_AMBULATORY_CARE_PROVIDER_SITE_OTHER): Payer: Managed Care, Other (non HMO) | Admitting: Nurse Practitioner

## 2014-07-15 VITALS — BP 104/69 | HR 79 | Temp 99.0°F | Ht 63.0 in | Wt 172.1 lb

## 2014-07-15 DIAGNOSIS — R358 Other polyuria: Secondary | ICD-10-CM

## 2014-07-15 DIAGNOSIS — R3589 Other polyuria: Secondary | ICD-10-CM

## 2014-07-15 LAB — POCT URINALYSIS DIPSTICK
Glucose, UA: 100
Ketones, UA: 15
Nitrite, UA: POSITIVE
Protein, UA: 100
Spec Grav, UA: 1.015
UROBILINOGEN UA: 4
pH, UA: 5

## 2014-07-15 MED ORDER — SULFAMETHOXAZOLE-TRIMETHOPRIM 800-160 MG PO TABS: 1.0000 | ORAL_TABLET | Freq: Two times a day (BID) | ORAL | Status: DC

## 2014-07-15 NOTE — Patient Instructions (Signed)
Call us if symptoms don't resolve with the antibiotics.

## 2014-07-15 NOTE — Progress Notes (Signed)
Pre visit review using our clinic review tool, if applicable. No additional management support is needed unless otherwise documented below in the visit note. 

## 2014-07-15 NOTE — Progress Notes (Signed)
   Subjective:    Patient ID: TANAYAH SQUITIERI, female    DOB: [birth year], 54 y.o.   MRN: [id number]  HPI  Ms. Caruthers is a 54 yo female with a  CC of polyuria.   1) 3 days of urinary frequency, denies dysuria and urgency.  Treatment to date: Azo- helpful   Review of Systems  Constitutional: Negative for fever, chills, diaphoresis and fatigue.  Respiratory: Negative for chest tightness, shortness of breath and wheezing.   Cardiovascular: Negative for chest pain, palpitations and leg swelling.  Gastrointestinal: Negative for nausea, vomiting and diarrhea.  Genitourinary: Positive for frequency. Negative for dysuria, urgency, hematuria, flank pain and decreased urine volume.  Skin: Negative for rash.  Neurological: Negative for dizziness, weakness, numbness and headaches.  Psychiatric/Behavioral: The patient is not nervous/anxious.       Objective:   Physical Exam  Constitutional: She is oriented to person, place, and time. She appears well-developed and well-nourished. No distress.  BP 104/69 mmHg  Pulse 79  Temp(Src) 99 F (37.2 C) (Oral)  Ht 5\' 3"  (1.6 m)  Wt 172 lb 2 oz (78.075 kg)  BMI 30.50 kg/m2  SpO2 98%   HENT:  Head: Normocephalic and atraumatic.  Right Ear: External ear normal.  Left Ear: External ear normal.  Cardiovascular: Normal rate, regular rhythm, normal heart sounds and intact distal pulses.  Exam reveals no gallop and no friction rub.   No murmur heard. Pulmonary/Chest: Effort normal and breath sounds normal. No respiratory distress. She has no wheezes. She has no rales. She exhibits no tenderness.  Neurological: She is alert and oriented to person, place, and time. No cranial nerve deficit. She exhibits normal muscle tone. Coordination normal.  Skin: Skin is warm and dry. No rash noted. She is not diaphoretic.  Psychiatric: She has a normal mood and affect. Her behavior is normal. Judgment and thought content normal.      Assessment & Plan:

## 2014-07-17 LAB — URINE CULTURE
Colony Count: NO GROWTH
Organism ID, Bacteria: NO GROWTH

## 2014-07-19 DIAGNOSIS — R358 Other polyuria: Secondary | ICD-10-CM | POA: Insufficient documentation

## 2014-07-19 DIAGNOSIS — R3589 Other polyuria: Secondary | ICD-10-CM | POA: Insufficient documentation

## 2014-07-19 NOTE — Assessment & Plan Note (Signed)
POCT urine shows probable UTI. Will treat with Bactrim DS. Will obtain culture.

## 2014-07-23 ENCOUNTER — Encounter: Payer: Self-pay | Admitting: Internal Medicine

## 2014-07-23 ENCOUNTER — Other Ambulatory Visit: Payer: Self-pay | Admitting: Internal Medicine

## 2014-07-23 MED ORDER — CIPROFLOXACIN HCL 500 MG PO TABS: 500.0000 mg | ORAL_TABLET | Freq: Two times a day (BID) | ORAL | Status: DC

## 2014-08-02 ENCOUNTER — Encounter: Payer: Self-pay | Admitting: Internal Medicine

## 2014-12-11 ENCOUNTER — Other Ambulatory Visit: Payer: Self-pay | Admitting: Internal Medicine

## 2014-12-30 ENCOUNTER — Ambulatory Visit (INDEPENDENT_AMBULATORY_CARE_PROVIDER_SITE_OTHER): Payer: Managed Care, Other (non HMO) | Admitting: Internal Medicine

## 2014-12-30 ENCOUNTER — Encounter: Payer: Self-pay | Admitting: Internal Medicine

## 2014-12-30 VITALS — BP 123/84 | HR 78 | Temp 98.2°F | Ht 63.0 in | Wt 174.0 lb

## 2014-12-30 DIAGNOSIS — I10 Essential (primary) hypertension: Secondary | ICD-10-CM

## 2014-12-30 DIAGNOSIS — E119 Type 2 diabetes mellitus without complications: Secondary | ICD-10-CM

## 2014-12-30 DIAGNOSIS — E669 Obesity, unspecified: Secondary | ICD-10-CM

## 2014-12-30 DIAGNOSIS — Z23 Encounter for immunization: Secondary | ICD-10-CM

## 2014-12-30 LAB — LIPID PANEL
Cholesterol: 196 mg/dL (ref 0–200)
HDL: 45.9 mg/dL (ref >39.00)
LDL Cholesterol: 124 mg/dL — ABNORMAL HIGH (ref 0–99)
NonHDL: 150.08
TRIGLYCERIDES: 132 mg/dL (ref 0.0–149.0)
Total CHOL/HDL Ratio: 4
VLDL: 26.4 mg/dL (ref 0.0–40.0)

## 2014-12-30 LAB — COMPREHENSIVE METABOLIC PANEL
ALBUMIN: 4.5 g/dL (ref 3.5–5.2)
ALK PHOS: 69 U/L (ref 39–117)
ALT: 37 U/L — ABNORMAL HIGH (ref 0–35)
AST: 34 U/L (ref 0–37)
BUN: 20 mg/dL (ref 6–23)
CHLORIDE: 98 meq/L (ref 96–112)
CO2: 32 mEq/L (ref 19–32)
Calcium: 9.8 mg/dL (ref 8.4–10.5)
Creatinine, Ser: 0.82 mg/dL (ref 0.40–1.20)
GFR: 76.61 mL/min (ref >60.00)
Glucose, Bld: 105 mg/dL — ABNORMAL HIGH (ref 70–99)
POTASSIUM: 4.1 meq/L (ref 3.5–5.1)
SODIUM: 138 meq/L (ref 135–145)
TOTAL PROTEIN: 7.9 g/dL (ref 6.0–8.3)
Total Bilirubin: 0.8 mg/dL (ref 0.2–1.2)

## 2014-12-30 LAB — MICROALBUMIN / CREATININE URINE RATIO
Creatinine,U: 143.5 mg/dL
MICROALB/CREAT RATIO: 0.5 mg/g (ref 0.0–30.0)

## 2014-12-30 LAB — TSH: TSH: 2.42 u[IU]/mL (ref 0.35–4.50)

## 2014-12-30 LAB — HEMOGLOBIN A1C: HEMOGLOBIN A1C: 5.9 % (ref 4.6–6.5)

## 2014-12-30 MED ORDER — LIRAGLUTIDE 18 MG/3ML ~~LOC~~ SOPN: 0.6000 mg | PEN_INJECTOR | Freq: Every day | SUBCUTANEOUS | Status: DC

## 2014-12-30 NOTE — Assessment & Plan Note (Signed)
Wt Readings from Last 3 Encounters:  12/30/14 174 lb (78.926 kg)  07/15/14 172 lb 2 oz (78.075 kg)  06/24/14 174 lb 3.2 oz (79.017 kg)   Body mass index is 30.83 kg/(m^2). Encouraged continued effort at exercise and diet. Will add Victoza to help with appetite and blood sugars.

## 2014-12-30 NOTE — Assessment & Plan Note (Signed)
BP Readings from Last 3 Encounters:  12/30/14 123/84  07/15/14 104/69  06/24/14 102/66   BP well controlled. Renal function with labs.

## 2014-12-30 NOTE — Patient Instructions (Signed)
Labs today.  Start Victoza 0.6mg  daily in the morning.  Follow up 4 weeks.

## 2014-12-30 NOTE — Progress Notes (Signed)
Pre visit review using our clinic review tool, if applicable. No additional management support is needed unless otherwise documented below in the visit note. 

## 2014-12-30 NOTE — Progress Notes (Signed)
Subjective:    Patient ID: Lindsay Mejia, female    DOB: [birth year], 54 y.o.   MRN: [id number]  HPI  54YO female presents for follow up.  Frustrated by lack of weight gain. Exercising and following healthy diet.  Stopped atorvastatin. Feels that this medication caused swelling in urethra.  DM - BG well controlled. Compliant with medications.    Wt Readings from Last 3 Encounters:  12/30/14 174 lb (78.926 kg)  07/15/14 172 lb 2 oz (78.075 kg)  06/24/14 174 lb 3.2 oz (79.017 kg)   BP Readings from Last 3 Encounters:  12/30/14 123/84  07/15/14 104/69  06/24/14 102/66     Past Medical History  Diagnosis Date  . Thyroid disease     s/p XRT to thyroid, Dr. Sabra Heck at [place]  . Hyperlipidemia   . Hypertension   . Rosacea     Dr. Lovell Sheehan   Family History  Problem Relation Age of Onset  . Cancer Mother     breast  . Hyperlipidemia Mother   . Hypertension Mother   . Hypertension Father   . Heart disease Father   . Hypertension Brother    Past Surgical History  Procedure Laterality Date  . Hernia repair      abdominal  . Knee arthroscopy w/ meniscal repair      2  . Appendectomy    . Breast surgery    . Cesarean section      4, Dr. Buford Dresser   Social History   Social History  . Marital Status: Married    Spouse Name: N/A  . Number of Children: N/A  . Years of Education: N/A   Social History Main Topics  . Smoking status: Never Smoker   . Smokeless tobacco: None  . Alcohol Use: Yes     Comment: occasionally  . Drug Use: None  . Sexual Activity: Not Asked   Other Topics Concern  . None   Social History Narrative   Lives in [place]. Has 4 children. 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  Eyes: Negative for visual disturbance.  Respiratory: Negative for cough and shortness of breath.   Cardiovascular: Negative for chest pain and leg  swelling.  Gastrointestinal: Negative for nausea, vomiting, abdominal pain, diarrhea and constipation.  Musculoskeletal: Negative for myalgias and arthralgias.  Skin: Negative for color change and rash.  Hematological: Negative for adenopathy. Does not bruise/bleed easily.  Psychiatric/Behavioral: Negative for sleep disturbance and dysphoric mood. The patient is not nervous/anxious.        Objective:    BP 123/84 mmHg  Pulse 78  Temp(Src) 98.2 F (36.8 C) (Oral)  Ht 5\' 3"  (1.6 m)  Wt 174 lb (78.926 kg)  BMI 30.83 kg/m2  SpO2 99% Physical Exam  Constitutional: She is oriented to person, place, and time. She appears well-developed and well-nourished. No distress.  HENT:  Head: Normocephalic and atraumatic.  Right Ear: External ear normal.  Left Ear: External ear normal.  Nose: Nose normal.  Mouth/Throat: Oropharynx is clear and moist. No oropharyngeal exudate.  Eyes: Conjunctivae are normal. Pupils are equal, round, and reactive to light. Right eye exhibits no discharge. Left eye exhibits no discharge. No scleral icterus.  Neck: Normal range of motion. Neck supple. No tracheal deviation present. No thyromegaly present.  Cardiovascular: Normal rate, regular rhythm, normal heart sounds and intact distal pulses.  Exam reveals no gallop and no friction rub.   No murmur heard. Pulmonary/Chest: Effort normal and breath sounds normal. No respiratory distress. She has no wheezes. She has no rales. She exhibits no tenderness.  Musculoskeletal: Normal range of motion. She exhibits no edema or tenderness.  Lymphadenopathy:    She has no cervical adenopathy.  Neurological: She is alert and oriented to person, place, and time. No cranial nerve deficit. She exhibits normal muscle tone. Coordination normal.  Skin: Skin is warm and dry. No rash noted. She is not diaphoretic. No erythema. No pallor.  Psychiatric: She has a normal mood and affect. Her behavior is normal. Judgment and thought content  normal.          Assessment & Plan:   Problem List Items Addressed This Visit      Unprioritized   Diabetes type 2, controlled - Primary    Will stop onglyza and add Victoza. Discussed potential risks and benefits of this medication. Follow up in 4 weeks.      Relevant Medications   Liraglutide (VICTOZA) 18 MG/3ML SOPN   Other Relevant Orders   Comprehensive metabolic panel   Hemoglobin A1c   Lipid panel   Microalbumin / creatinine urine ratio   TSH   Essential hypertension, benign    BP Readings from Last 3 Encounters:  12/30/14 123/84  07/15/14 104/69  06/24/14 102/66   BP well controlled. Renal function with labs.      Obesity (BMI 30-39.9)    Wt Readings from Last 3 Encounters:  12/30/14 174 lb (78.926 kg)  07/15/14 172 lb 2 oz (78.075 kg)  06/24/14 174 lb 3.2 oz (79.017 kg)   Body mass index is 30.83 kg/(m^2). Encouraged continued effort at exercise and diet. Will add Victoza to help with appetite and blood sugars.       Relevant Medications   Liraglutide (VICTOZA) 18 MG/3ML SOPN       Return in about 4 weeks (around 01/27/2015) for Recheck.

## 2014-12-30 NOTE — Assessment & Plan Note (Signed)
Will stop onglyza and add Victoza. Discussed potential risks and benefits of this medication. Follow up in 4 weeks.

## 2015-01-14 ENCOUNTER — Other Ambulatory Visit: Payer: Self-pay | Admitting: Internal Medicine

## 2015-01-16 ENCOUNTER — Other Ambulatory Visit: Payer: Self-pay | Admitting: Internal Medicine

## 2015-01-27 ENCOUNTER — Ambulatory Visit: Payer: Managed Care, Other (non HMO) | Admitting: Internal Medicine

## 2015-01-28 ENCOUNTER — Encounter: Payer: Self-pay | Admitting: Internal Medicine

## 2015-01-28 ENCOUNTER — Ambulatory Visit (INDEPENDENT_AMBULATORY_CARE_PROVIDER_SITE_OTHER): Payer: Managed Care, Other (non HMO) | Admitting: Internal Medicine

## 2015-01-28 VITALS — BP 132/79 | HR 73 | Temp 98.1°F | Ht 63.0 in | Wt 171.2 lb

## 2015-01-28 DIAGNOSIS — E119 Type 2 diabetes mellitus without complications: Secondary | ICD-10-CM

## 2015-01-28 DIAGNOSIS — E669 Obesity, unspecified: Secondary | ICD-10-CM | POA: Diagnosis not present

## 2015-01-28 MED ORDER — LIRAGLUTIDE 18 MG/3ML ~~LOC~~ SOPN: 0.6000 mg | PEN_INJECTOR | Freq: Every day | SUBCUTANEOUS | Status: DC

## 2015-01-28 NOTE — Progress Notes (Signed)
Subjective:    Patient ID: Lindsay Mejia, female    DOB: [birth year], 54 y.o.   MRN: [id number]  HPI  54YO female presents for follow up.  Seen 9/15 and started on Victoza to help with appetite and diabetes control.  Tolerating Victoza well. No side effects noted.  Wt Readings from Last 3 Encounters:  01/28/15 171 lb 4 oz (77.678 kg)  12/30/14 174 lb (78.926 kg)  07/15/14 172 lb 2 oz (78.075 kg)   BP Readings from Last 3 Encounters:  01/28/15 132/79  12/30/14 123/84  07/15/14 104/69    Past Medical History  Diagnosis Date  . Thyroid disease     s/p XRT to thyroid, Dr. Sabra Heck at [place]  . Hyperlipidemia   . Hypertension   . Rosacea     Dr. Lovell Sheehan   Family History  Problem Relation Age of Onset  . Cancer Mother     breast  . Hyperlipidemia Mother   . Hypertension Mother   . Hypertension Father   . Heart disease Father   . Hypertension Brother    Past Surgical History  Procedure Laterality Date  . Hernia repair      abdominal  . Knee arthroscopy w/ meniscal repair      2  . Appendectomy    . Breast surgery    . Cesarean section      4, Dr. Buford Dresser   Social History   Social History  . Marital Status: Married    Spouse Name: N/A  . Number of Children: N/A  . Years of Education: N/A   Social History Main Topics  . Smoking status: Never Smoker   . Smokeless tobacco: None  . Alcohol Use: Yes     Comment: occasionally  . Drug Use: None  . Sexual Activity: Not Asked   Other Topics Concern  . None   Social History Narrative   Lives in [place]. Has 4 children. 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  Eyes: Negative for visual disturbance.  Respiratory: Negative for shortness of breath.   Cardiovascular: Negative for chest pain and leg swelling.  Gastrointestinal: Negative for nausea, vomiting, abdominal pain, diarrhea and constipation.   Skin: Negative for color change and rash.  Hematological: Negative for adenopathy. Does not bruise/bleed easily.  Psychiatric/Behavioral: Negative for dysphoric mood. The patient is not nervous/anxious.        Objective:    BP 132/79 mmHg  Pulse 73  Temp(Src) 98.1 F (36.7 C) (Oral)  Ht 5\' 3"  (1.6 m)  Wt 171 lb 4 oz (77.678 kg)  BMI 30.34 kg/m2  SpO2 98% Physical Exam  Constitutional: She is oriented to person, place, and time. She appears well-developed and well-nourished. No distress.  HENT:  Head: Normocephalic and atraumatic.  Right Ear: External ear normal.  Left Ear: External ear normal.  Nose: Nose normal.  Mouth/Throat: Oropharynx is clear and moist. No oropharyngeal exudate.  Eyes: Conjunctivae are normal. Pupils are equal, round, and reactive to light. Right eye exhibits no discharge. Left eye exhibits no discharge. No scleral icterus.  Neck: Normal range of motion. Neck supple. No tracheal deviation present. No thyromegaly present.  Cardiovascular: Normal rate, regular rhythm, normal heart sounds and intact distal pulses.  Exam reveals no gallop and no friction rub.   No murmur heard. Pulmonary/Chest: Effort normal and breath sounds normal. No respiratory distress.  She has no wheezes. She has no rales. She exhibits no tenderness.  Musculoskeletal: Normal range of motion. She exhibits no edema or tenderness.  Lymphadenopathy:    She has no cervical adenopathy.  Neurological: She is alert and oriented to person, place, and time. No cranial nerve deficit. She exhibits normal muscle tone. Coordination normal.  Skin: Skin is warm and dry. No rash noted. She is not diaphoretic. No erythema. No pallor.  Psychiatric: She has a normal mood and affect. Her behavior is normal. Judgment and thought content normal.          Assessment & Plan:   Problem List Items Addressed This Visit      Unprioritized   Diabetes type 2, controlled ([place]) - Primary    BG well controlled  on Victoza. Will repeat A1c in 3 months.      Relevant Medications   Liraglutide (VICTOZA) 18 MG/3ML SOPN   Obesity (BMI 30-39.9)    Wt Readings from Last 3 Encounters:  01/28/15 171 lb 4 oz (77.678 kg)  12/30/14 174 lb (78.926 kg)  07/15/14 172 lb 2 oz (78.075 kg)   Body mass index is 30.34 kg/(m^2). Appetite improved with use of Victoza. Will continue medication. Congratulated pt on weight loss.      Relevant Medications   Liraglutide (VICTOZA) 18 MG/3ML SOPN       Return in about 2 months (around 03/30/2015) for Recheck of Diabetes.

## 2015-01-28 NOTE — Assessment & Plan Note (Signed)
BG well controlled on Victoza. Will repeat A1c in 3 months.

## 2015-01-28 NOTE — Patient Instructions (Signed)
Increase Victoza to 1.2 mg daily 

## 2015-01-28 NOTE — Progress Notes (Signed)
Pre visit review using our clinic review tool, if applicable. No additional management support is needed unless otherwise documented below in the visit note. 

## 2015-01-28 NOTE — Assessment & Plan Note (Signed)
Wt Readings from Last 3 Encounters:  01/28/15 171 lb 4 oz (77.678 kg)  12/30/14 174 lb (78.926 kg)  07/15/14 172 lb 2 oz (78.075 kg)   Body mass index is 30.34 kg/(m^2). Appetite improved with use of Victoza. Will continue medication. Congratulated pt on weight loss.

## 2015-02-11 ENCOUNTER — Other Ambulatory Visit: Payer: Self-pay | Admitting: Internal Medicine

## 2015-03-19 ENCOUNTER — Other Ambulatory Visit: Payer: Self-pay | Admitting: Internal Medicine

## 2015-03-29 ENCOUNTER — Encounter: Payer: Self-pay | Admitting: Internal Medicine

## 2015-03-29 ENCOUNTER — Ambulatory Visit (INDEPENDENT_AMBULATORY_CARE_PROVIDER_SITE_OTHER): Payer: Managed Care, Other (non HMO) | Admitting: Internal Medicine

## 2015-03-29 VITALS — BP 126/69 | HR 72 | Temp 98.8°F | Ht 63.0 in | Wt 167.0 lb

## 2015-03-29 DIAGNOSIS — E039 Hypothyroidism, unspecified: Secondary | ICD-10-CM

## 2015-03-29 DIAGNOSIS — E669 Obesity, unspecified: Secondary | ICD-10-CM | POA: Diagnosis not present

## 2015-03-29 DIAGNOSIS — E119 Type 2 diabetes mellitus without complications: Secondary | ICD-10-CM

## 2015-03-29 DIAGNOSIS — I10 Essential (primary) hypertension: Secondary | ICD-10-CM

## 2015-03-29 LAB — COMPREHENSIVE METABOLIC PANEL
ALK PHOS: 68 U/L (ref 39–117)
ALT: 25 U/L (ref 0–35)
AST: 21 U/L (ref 0–37)
Albumin: 4.1 g/dL (ref 3.5–5.2)
BILIRUBIN TOTAL: 0.5 mg/dL (ref 0.2–1.2)
BUN: 18 mg/dL (ref 6–23)
CO2: 31 mEq/L (ref 19–32)
Calcium: 9.3 mg/dL (ref 8.4–10.5)
Chloride: 102 mEq/L (ref 96–112)
Creatinine, Ser: 0.74 mg/dL (ref 0.40–1.20)
GFR: 86.17 mL/min (ref >60.00)
GLUCOSE: 97 mg/dL (ref 70–99)
Potassium: 3.9 mEq/L (ref 3.5–5.1)
SODIUM: 142 meq/L (ref 135–145)
TOTAL PROTEIN: 6.8 g/dL (ref 6.0–8.3)

## 2015-03-29 LAB — MICROALBUMIN / CREATININE URINE RATIO
CREATININE, U: 307.2 mg/dL
MICROALB/CREAT RATIO: 0.6 mg/g (ref 0.0–30.0)
Microalb, Ur: 1.8 mg/dL (ref 0.0–1.9)

## 2015-03-29 LAB — TSH: TSH: 2.23 u[IU]/mL (ref 0.35–4.50)

## 2015-03-29 LAB — HEMOGLOBIN A1C: Hgb A1c MFr Bld: 5.6 % (ref 4.6–6.5)

## 2015-03-29 LAB — LIPID PANEL
CHOL/HDL RATIO: 5
Cholesterol: 183 mg/dL (ref 0–200)
HDL: 38.8 mg/dL — ABNORMAL LOW (ref >39.00)
LDL Cholesterol: 112 mg/dL — ABNORMAL HIGH (ref 0–99)
NONHDL: 143.7
Triglycerides: 159 mg/dL — ABNORMAL HIGH (ref 0.0–149.0)
VLDL: 31.8 mg/dL (ref 0.0–40.0)

## 2015-03-29 MED ORDER — LIRAGLUTIDE 18 MG/3ML ~~LOC~~ SOPN: 1.2000 mg | PEN_INJECTOR | Freq: Every day | SUBCUTANEOUS | Status: DC

## 2015-03-29 NOTE — Patient Instructions (Addendum)
Labs today.   Follow up in 3 months.  

## 2015-03-29 NOTE — Assessment & Plan Note (Signed)
Will check A1c with labs today. Continue Victoza.

## 2015-03-29 NOTE — Assessment & Plan Note (Signed)
BP Readings from Last 3 Encounters:  03/29/15 126/69  01/28/15 132/79  12/30/14 123/84   BP well controlled. Continue current medication. Renal function with labs.

## 2015-03-29 NOTE — Assessment & Plan Note (Signed)
Wt Readings from Last 3 Encounters:  03/29/15 167 lb (75.751 kg)  01/28/15 171 lb 4 oz (77.678 kg)  12/30/14 174 lb (78.926 kg)   Encouraged healthy diet and exercise. Continue Victoza.

## 2015-03-29 NOTE — Assessment & Plan Note (Signed)
Will check TSH with labs today. Continue Synthroid.

## 2015-03-29 NOTE — Progress Notes (Signed)
Pre visit review using our clinic review tool, if applicable. No additional management support is needed unless otherwise documented below in the visit note. 

## 2015-03-29 NOTE — Progress Notes (Addendum)
Subjective:    Patient ID: Lindsay Mejia, female    DOB: 12/30/1960, 54 y.o.   MRN: [id number]  HPI  54YO female presents for follow up.  DM - Portion sizes have decreased with use of Victoza. Occasional nausea, but mild. Taking 1.2mg  daily of Victoza. Does not check BG.    Wt Readings from Last 3 Encounters:  03/29/15 167 lb (75.751 kg)  01/28/15 171 lb 4 oz (77.678 kg)  12/30/14 174 lb (78.926 kg)   BP Readings from Last 3 Encounters:  03/29/15 126/69  01/28/15 132/79  12/30/14 123/84    Past Medical History  Diagnosis Date  . Thyroid disease     s/p XRT to thyroid, Dr. Sabra Heck at [place]  . Hyperlipidemia   . Hypertension   . Rosacea     Dr. Lovell Sheehan   Family History  Problem Relation Age of Onset  . Cancer Mother     breast  . Hyperlipidemia Mother   . Hypertension Mother   . Hypertension Father   . Heart disease Father   . Hypertension Brother    Past Surgical History  Procedure Laterality Date  . Hernia repair      abdominal  . Knee arthroscopy w/ meniscal repair      2  . Appendectomy    . Breast surgery    . Cesarean section      4, Dr. Buford Dresser   Social History   Social History  . Marital Status: Married    Spouse Name: N/A  . Number of Children: N/A  . Years of Education: N/A   Social History Main Topics  . Smoking status: Never Smoker   . Smokeless tobacco: None  . Alcohol Use: Yes     Comment: occasionally  . Drug Use: None  . Sexual Activity: Not Asked   Other Topics Concern  . None   Social History Narrative   Lives in [place]. Has 4 children. 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  Eyes: Negative for visual disturbance.  Respiratory: Negative for shortness of breath.   Cardiovascular: Negative for chest pain and leg swelling.  Gastrointestinal: Negative for nausea, vomiting, abdominal pain, diarrhea and  constipation.  Musculoskeletal: Negative for myalgias and arthralgias.  Skin: Negative for color change and rash.  Hematological: Negative for adenopathy. Does not bruise/bleed easily.  Psychiatric/Behavioral: Negative for dysphoric mood. The patient is not nervous/anxious.        Objective:    BP 126/69 mmHg  Pulse 72  Temp(Src) 98.8 F (37.1 C) (Oral)  Ht 5\' 3"  (1.6 m)  Wt 167 lb (75.751 kg)  BMI 29.59 kg/m2  SpO2 98% Physical Exam  Constitutional: She is oriented to person, place, and time. She appears well-developed and well-nourished. No distress.  HENT:  Head: Normocephalic and atraumatic.  Right Ear: External ear normal.  Left Ear: External ear normal.  Nose: Nose normal.  Mouth/Throat: Oropharynx is clear and moist. No oropharyngeal exudate.  Eyes: Conjunctivae are normal. Pupils are equal, round, and reactive to light. Right eye exhibits no discharge. Left eye exhibits no discharge. No scleral icterus.  Neck: Normal range of motion. Neck supple. No tracheal deviation present. No thyromegaly present.  Cardiovascular: Normal rate, regular rhythm, normal heart sounds and intact distal pulses.  Exam reveals no gallop and no friction rub.   No murmur heard. Pulmonary/Chest: Effort normal  and breath sounds normal. No respiratory distress. She has no wheezes. She has no rales. She exhibits no tenderness.  Musculoskeletal: Normal range of motion. She exhibits no edema or tenderness.  Lymphadenopathy:    She has no cervical adenopathy.  Neurological: She is alert and oriented to person, place, and time. No cranial nerve deficit. She exhibits normal muscle tone. Coordination normal.  Skin: Skin is warm and dry. No rash noted. She is not diaphoretic. No erythema. No pallor.  Psychiatric: She has a normal mood and affect. Her behavior is normal. Judgment and thought content normal.          Assessment & Plan:   Problem List Items Addressed This Visit      Unprioritized    Diabetes type 2, controlled ([place]) - Primary    Will check A1c with labs today. Continue Victoza.      Relevant Medications   Liraglutide (VICTOZA) 18 MG/3ML SOPN   Other Relevant Orders   Comprehensive metabolic panel   Hemoglobin A1c   Lipid panel   Microalbumin / creatinine urine ratio   Essential hypertension, benign    BP Readings from Last 3 Encounters:  03/29/15 126/69  01/28/15 132/79  12/30/14 123/84   BP well controlled. Continue current medication. Renal function with labs.      Hypothyroidism    Will check TSH with labs today. Continue Synthroid.      Relevant Orders   TSH   Obesity (BMI 30-39.9)    Wt Readings from Last 3 Encounters:  03/29/15 167 lb (75.751 kg)  01/28/15 171 lb 4 oz (77.678 kg)  12/30/14 174 lb (78.926 kg)   Encouraged healthy diet and exercise. Continue Victoza.      Relevant Medications   Liraglutide (VICTOZA) 18 MG/3ML SOPN       Return in about 3 months (around 06/27/2015) for Recheck of Diabetes.

## 2015-04-19 ENCOUNTER — Other Ambulatory Visit: Payer: Self-pay | Admitting: Internal Medicine

## 2015-04-21 ENCOUNTER — Telehealth: Payer: Self-pay | Admitting: Internal Medicine

## 2015-04-21 NOTE — Telephone Encounter (Signed)
PA submitted through [facility] (cover my meds) on 04/21/15 for Victoza. Key [id number]

## 2015-04-26 ENCOUNTER — Encounter: Payer: Self-pay | Admitting: Internal Medicine

## 2015-04-27 ENCOUNTER — Other Ambulatory Visit: Payer: Self-pay

## 2015-04-27 DIAGNOSIS — E119 Type 2 diabetes mellitus without complications: Secondary | ICD-10-CM

## 2015-04-27 MED ORDER — LIRAGLUTIDE 18 MG/3ML ~~LOC~~ SOPN: 1.8000 mg | PEN_INJECTOR | Freq: Every day | SUBCUTANEOUS | Status: DC

## 2015-05-06 ENCOUNTER — Encounter: Payer: Self-pay | Admitting: Internal Medicine

## 2015-05-12 ENCOUNTER — Other Ambulatory Visit: Payer: Self-pay | Admitting: Internal Medicine

## 2015-05-12 MED ORDER — EXENATIDE 5 MCG/0.02ML ~~LOC~~ SOPN: 5.0000 ug | PEN_INJECTOR | Freq: Two times a day (BID) | SUBCUTANEOUS | Status: DC

## 2015-05-14 ENCOUNTER — Other Ambulatory Visit: Payer: Self-pay | Admitting: Internal Medicine

## 2015-06-10 ENCOUNTER — Encounter: Payer: Self-pay | Admitting: *Deleted

## 2015-06-14 ENCOUNTER — Encounter: Payer: Self-pay | Admitting: Obstetrics and Gynecology

## 2015-06-14 ENCOUNTER — Ambulatory Visit (INDEPENDENT_AMBULATORY_CARE_PROVIDER_SITE_OTHER): Payer: Managed Care, Other (non HMO) | Admitting: Obstetrics and Gynecology

## 2015-06-14 VITALS — BP 134/87 | HR 67 | Ht 63.0 in | Wt 169.5 lb

## 2015-06-14 DIAGNOSIS — Z01419 Encounter for gynecological examination (general) (routine) without abnormal findings: Secondary | ICD-10-CM | POA: Diagnosis not present

## 2015-06-14 NOTE — Progress Notes (Signed)
Subjective:   Lindsay Mejia is a 55 y.o. No obstetric history on file. Caucasian female here for a routine well-woman exam.  No LMP recorded. Patient has had a hysterectomy.    Current complaints: none PCP: walker       Doesn't need labs  Social History: Sexual: heterosexual Marital Status: married Living situation: with spouse Occupation: homemaker Tobacco/alcohol: no tobacco use Illicit drugs: no history of illicit drug use  The following portions of the patient's history were reviewed and updated as appropriate: allergies, current medications, past family history, past medical history, past social history, past surgical history and problem list.  Past Medical History Past Medical History  Diagnosis Date  . Thyroid disease     s/p XRT to thyroid, Dr. Sabra Heck at [place]  . Hyperlipidemia   . Hypertension   . Rosacea     Dr. Lovell Sheehan  . Anxiety   . Diabetes mellitus without complication ([place])   . Hypothyroidism     Past Surgical History Past Surgical History  Procedure Laterality Date  . Hernia repair      abdominal  . Knee arthroscopy w/ meniscal repair      2  . Appendectomy    . Breast surgery    . Cesarean section      4, Dr. Buford Dresser    Gynecologic History No obstetric history on file.  No LMP recorded. Patient has had a hysterectomy. Contraception: status post hysterectomy Last Pap: 2011. Results were: normal Last mammogram: 2016. Results were: normal  Obstetric History OB History  No data available    Current Medications Current Outpatient Prescriptions on File Prior to Visit  Medication Sig Dispense Refill  . aspirin 81 MG tablet Take 81 mg by mouth daily.    Marland Kitchen BIOTIN PO Take by mouth.    . Blood Glucose Monitoring Suppl (ONE TOUCH ULTRA SYSTEM KIT) W/DEVICE KIT 1 kit by Does not apply route once. 1 each 0  . escitalopram (LEXAPRO) 10 MG tablet TAKE 1 TABLET BY MOUTH EVERY DAY 30 tablet 0  . exenatide (BYETTA 5 MCG PEN) 5 MCG/0.02ML SOPN  injection Inject 0.02 mLs (5 mcg total) into the skin 2 (two) times daily with a meal. 1.2 mL 3  . lisinopril-hydrochlorothiazide (PRINZIDE,ZESTORETIC) 10-12.5 MG tablet TAKE 1 TABLET BY MOUTH EVERY DAY 30 tablet 5  . metFORMIN (GLUCOPHAGE) 500 MG tablet TAKE 1 TABLET BY MOUTH TWICE DAILY WITH MEALS 60 tablet 5  . Multiple Vitamin (MULTIVITAMIN) tablet Take 1 tablet by mouth daily.    Marland Kitchen SYNTHROID 88 MCG tablet TAKE 1 TABLET BY MOUTH EVERY MORNING BEFORE BREAKFAST 30 tablet 5  . Cyanocobalamin (B-12 PO) Take by mouth daily. Reported on 06/14/2015    . escitalopram (LEXAPRO) 10 MG tablet TAKE 1 TABLET BY MOUTH EVERY DAY (Patient not taking: Reported on 06/14/2015) 30 tablet 5  . Liraglutide (VICTOZA) 18 MG/3ML SOPN Inject 0.3 mLs (1.8 mg total) into the skin daily. (Patient not taking: Reported on 06/14/2015) 6 mL 11  . Omega-3 Fatty Acids (FISH OIL) 1200 MG CAPS Take 1,200 mg by mouth daily. Reported on 06/14/2015     No current facility-administered medications on file prior to visit.    Review of Systems Patient denies any headaches, blurred vision, shortness of breath, chest pain, abdominal pain, problems with bowel movements, urination, or intercourse.  Objective:  BP 134/87 mmHg  Pulse 67  Ht 5' 3" (1.6 m)  Wt 169 lb 8 oz (76.885 kg)  BMI 30.03 kg/m2 Physical Exam  General:  Well developed, well nourished, no acute distress. She is alert and oriented x3. Skin:  Warm and dry Neck:  Midline trachea, no thyromegaly or nodules Cardiovascular: Regular rate and rhythm, no murmur heard Lungs:  Effort normal, all lung fields clear to auscultation bilaterally Breasts:  No dominant palpable mass, retraction, or nipple discharge Abdomen:  Soft, non tender, no hepatosplenomegaly or masses Pelvic:  External genitalia is normal in appearance.  The vagina is normal in appearance. The cervix is bulbous, no CMT.  Thin prep pap is not done . Uterus is surgically absent  No adnexal masses or tenderness  noted. Extremities:  No swelling or varicosities noted Psych:  She has a normal mood and affect  Assessment:   Healthy well-woman exam  Plan:   F/U 1 year for AEscheduled, or sooner if needed Mammogram scheduled  Jeannia Tatro Rockney Ghee, CNM

## 2015-06-14 NOTE — Patient Instructions (Signed)
Place annual gynecologic exam patient instructions here.

## 2015-06-27 ENCOUNTER — Ambulatory Visit (INDEPENDENT_AMBULATORY_CARE_PROVIDER_SITE_OTHER): Payer: Managed Care, Other (non HMO) | Admitting: Internal Medicine

## 2015-06-27 ENCOUNTER — Encounter: Payer: Self-pay | Admitting: Internal Medicine

## 2015-06-27 VITALS — BP 107/68 | HR 67 | Temp 98.4°F | Resp 16 | Ht 63.0 in | Wt 164.2 lb

## 2015-06-27 DIAGNOSIS — E663 Overweight: Secondary | ICD-10-CM

## 2015-06-27 DIAGNOSIS — E119 Type 2 diabetes mellitus without complications: Secondary | ICD-10-CM

## 2015-06-27 DIAGNOSIS — I10 Essential (primary) hypertension: Secondary | ICD-10-CM | POA: Diagnosis not present

## 2015-06-27 LAB — COMPREHENSIVE METABOLIC PANEL
ALK PHOS: 61 U/L (ref 39–117)
ALT: 22 U/L (ref 0–35)
AST: 25 U/L (ref 0–37)
Albumin: 4.4 g/dL (ref 3.5–5.2)
BILIRUBIN TOTAL: 0.7 mg/dL (ref 0.2–1.2)
BUN: 16 mg/dL (ref 6–23)
CALCIUM: 9.6 mg/dL (ref 8.4–10.5)
CO2: 30 mEq/L (ref 19–32)
Chloride: 99 mEq/L (ref 96–112)
Creatinine, Ser: 0.8 mg/dL (ref 0.40–1.20)
GFR: 78.68 mL/min (ref >60.00)
GLUCOSE: 98 mg/dL (ref 70–99)
Potassium: 3.9 mEq/L (ref 3.5–5.1)
Sodium: 138 mEq/L (ref 135–145)
TOTAL PROTEIN: 7.5 g/dL (ref 6.0–8.3)

## 2015-06-27 LAB — HEMOGLOBIN A1C: Hgb A1c MFr Bld: 5.8 % (ref 4.6–6.5)

## 2015-06-27 NOTE — Patient Instructions (Signed)
Labs today.   Follow up in 3 months.  

## 2015-06-27 NOTE — Assessment & Plan Note (Signed)
Wt Readings from Last 3 Encounters:  06/27/15 164 lb 3.2 oz (74.481 kg)  06/14/15 169 lb 8 oz (76.885 kg)  03/29/15 167 lb (75.751 kg)   Body mass index is 29.09 kg/(m^2). Congratulated pt on weight loss. Encouraged continued healthy diet and exercise.

## 2015-06-27 NOTE — Progress Notes (Signed)
Subjective:    Patient ID: Lindsay Mejia, female    DOB: May 23, 1960, 55 y.o.   MRN: [id number]  HPI  55YO female presents for follow up.  DM - Doesn't check BG. Changed to Byetta because of insurance coverage.  Limiting sugar and carb intake. Planning to start exercising more.   Wt Readings from Last 3 Encounters:  06/27/15 164 lb 3.2 oz (74.481 kg)  06/14/15 169 lb 8 oz (76.885 kg)  03/29/15 167 lb (75.751 kg)   BP Readings from Last 3 Encounters:  06/27/15 107/68  06/14/15 134/87  03/29/15 126/69    Past Medical History  Diagnosis Date  . Thyroid disease     s/p XRT to thyroid, Dr. Sabra Heck at [facility]  . Hyperlipidemia   . Hypertension   . Rosacea     Dr. Lovell Sheehan  . Anxiety   . Diabetes mellitus without complication ([place])   . Hypothyroidism    Family History  Problem Relation Age of Onset  . Cancer Mother     breast  . Hyperlipidemia Mother   . Hypertension Mother   . Hypertension Father   . Heart disease Father   . Hypertension Brother    Past Surgical History  Procedure Laterality Date  . Hernia repair      abdominal  . Knee arthroscopy w/ meniscal repair      2  . Appendectomy    . Breast surgery    . Cesarean section      4, Dr. Buford Dresser   Social History   Social History  . Marital Status: Married    Spouse Name: N/A  . Number of Children: N/A  . Years of Education: N/A   Social History Main Topics  . Smoking status: Never Smoker   . Smokeless tobacco: Never Used  . Alcohol Use: Yes     Comment: occasionally  . Drug Use: No  . Sexual Activity: Yes   Other Topics Concern  . None   Social History Narrative   Lives in [place]. Has 4 children. 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  Eyes: Negative for visual disturbance.  Respiratory: Negative for shortness of breath.   Cardiovascular: Negative for chest pain and leg  swelling.  Gastrointestinal: Negative for nausea, vomiting, abdominal pain, diarrhea and constipation.  Musculoskeletal: Negative for myalgias and arthralgias.  Skin: Negative for color change and rash.  Hematological: Negative for adenopathy. Does not bruise/bleed easily.  Psychiatric/Behavioral: Negative for sleep disturbance and dysphoric mood. The patient is not nervous/anxious.        Objective:    BP 107/68 mmHg  Pulse 67  Temp(Src) 98.4 F (36.9 C) (Oral)  Resp 16  Ht 5\' 3"  (1.6 m)  Wt 164 lb 3.2 oz (74.481 kg)  BMI 29.09 kg/m2  SpO2 98% Physical Exam  Constitutional: She is oriented to person, place, and time. She appears well-developed and well-nourished. No distress.  HENT:  Head: Normocephalic and atraumatic.  Right Ear: External ear normal.  Left Ear: External ear normal.  Nose: Nose normal.  Mouth/Throat: Oropharynx is clear and moist. No oropharyngeal exudate.  Eyes: Conjunctivae are normal. Pupils are equal, round, and reactive to light. Right eye exhibits no discharge. Left eye exhibits no discharge. No scleral icterus.  Neck: Normal range of motion. Neck supple. No tracheal deviation present. No thyromegaly present.  Cardiovascular: Normal rate, regular  rhythm, normal heart sounds and intact distal pulses.  Exam reveals no gallop and no friction rub.   No murmur heard. Pulmonary/Chest: Effort normal and breath sounds normal. No respiratory distress. She has no wheezes. She has no rales. She exhibits no tenderness.  Musculoskeletal: Normal range of motion. She exhibits no edema or tenderness.  Lymphadenopathy:    She has no cervical adenopathy.  Neurological: She is alert and oriented to person, place, and time. No cranial nerve deficit. She exhibits normal muscle tone. Coordination normal.  Skin: Skin is warm and dry. No rash noted. She is not diaphoretic. No erythema. No pallor.  Psychiatric: She has a normal mood and affect. Her behavior is normal. Judgment  and thought content normal.          Assessment & Plan:   Problem List Items Addressed This Visit      Unprioritized   Diabetes type 2, controlled ([place]) - Primary    Will check A1c with labs. Continue Byetta. Discussed potentially increasing dose to 58mcg bid.  Follow up in 3 months and prn.      Relevant Orders   Comprehensive metabolic panel   Hemoglobin A1c   Essential hypertension, benign    BP Readings from Last 3 Encounters:  06/27/15 107/68  06/14/15 134/87  03/29/15 126/69   BP well controlled. Renal function with labs. Continue Lisinopril-HCTZ.      Overweight (BMI 25.0-29.9)    Wt Readings from Last 3 Encounters:  06/27/15 164 lb 3.2 oz (74.481 kg)  06/14/15 169 lb 8 oz (76.885 kg)  03/29/15 167 lb (75.751 kg)   Body mass index is 29.09 kg/(m^2). Congratulated pt on weight loss. Encouraged continued healthy diet and exercise.          Return in about 3 months (around 09/27/2015).  Ronette Deter, MD Internal Medicine [facility]

## 2015-06-27 NOTE — Assessment & Plan Note (Signed)
BP Readings from Last 3 Encounters:  06/27/15 107/68  06/14/15 134/87  03/29/15 126/69   BP well controlled. Renal function with labs. Continue Lisinopril-HCTZ.

## 2015-06-27 NOTE — Assessment & Plan Note (Signed)
Will check A1c with labs. Continue Byetta. Discussed potentially increasing dose to 29mcg bid.  Follow up in 3 months and prn.

## 2015-07-07 ENCOUNTER — Ambulatory Visit
Admission: RE | Admit: 2015-07-07 | Discharge: 2015-07-07 | Disposition: A | Discharge status: Home / Self Care | Payer: Managed Care, Other (non HMO) | Source: Ambulatory Visit | Attending: Obstetrics and Gynecology | Admitting: Obstetrics and Gynecology

## 2015-07-07 DIAGNOSIS — Z01419 Encounter for gynecological examination (general) (routine) without abnormal findings: Secondary | ICD-10-CM

## 2015-07-07 DIAGNOSIS — Z1231 Encounter for screening mammogram for malignant neoplasm of breast: Secondary | ICD-10-CM | POA: Diagnosis present

## 2015-07-12 ENCOUNTER — Encounter: Payer: Self-pay | Admitting: Internal Medicine

## 2015-07-12 MED ORDER — EXENATIDE 5 MCG/0.02ML ~~LOC~~ SOPN: 10.0000 ug | PEN_INJECTOR | Freq: Two times a day (BID) | SUBCUTANEOUS | Status: DC

## 2015-08-16 ENCOUNTER — Encounter: Payer: Self-pay | Admitting: Internal Medicine

## 2015-08-16 ENCOUNTER — Ambulatory Visit (INDEPENDENT_AMBULATORY_CARE_PROVIDER_SITE_OTHER): Payer: Managed Care, Other (non HMO) | Admitting: Internal Medicine

## 2015-08-16 VITALS — BP 100/60 | HR 82 | Ht 63.0 in | Wt 158.0 lb

## 2015-08-16 DIAGNOSIS — L03211 Cellulitis of face: Secondary | ICD-10-CM | POA: Diagnosis not present

## 2015-08-16 MED ORDER — DOXYCYCLINE HYCLATE 100 MG PO TABS: 100.0000 mg | ORAL_TABLET | Freq: Two times a day (BID) | ORAL | Status: DC

## 2015-08-16 MED ORDER — GENTAMICIN SULFATE 0.1 % EX OINT: 1.0000 "application " | TOPICAL_OINTMENT | Freq: Three times a day (TID) | CUTANEOUS | Status: DC

## 2015-08-16 NOTE — Progress Notes (Signed)
Subjective:    Patient ID: Lindsay Mejia, female    DOB: 04/16/1961, 55 y.o.   MRN: [id number]  HPI  55YO female presents for acute visit.  Developed pustule in nose 2 days ago. Initially drained some fluid, but then became more red and irritated. No fever, chills.   Wt Readings from Last 3 Encounters:  08/16/15 158 lb (71.668 kg)  06/27/15 164 lb 3.2 oz (74.481 kg)  06/14/15 169 lb 8 oz (76.885 kg)   BP Readings from Last 3 Encounters:  08/16/15 100/60  06/27/15 107/68  06/14/15 134/87    Past Medical History  Diagnosis Date  . Thyroid disease     s/p XRT to thyroid, Dr. Sabra Heck at [facility]  . Hyperlipidemia   . Hypertension   . Rosacea     Dr. Lovell Sheehan  . Anxiety   . Diabetes mellitus without complication ([place])   . Hypothyroidism    Family History  Problem Relation Age of Onset  . Hyperlipidemia Mother   . Hypertension Mother   . Breast cancer Mother 30  . Hypertension Father   . Heart disease Father   . Hypertension Brother    Past Surgical History  Procedure Laterality Date  . Hernia repair      abdominal  . Knee arthroscopy w/ meniscal repair      2  . Appendectomy    . Breast surgery    . Cesarean section      4, Dr. Buford Dresser  . Breast biopsy Right 2006    neg   Social History   Social History  . Marital Status: Married    Spouse Name: N/A  . Number of Children: N/A  . Years of Education: N/A   Social History Main Topics  . Smoking status: Never Smoker   . Smokeless tobacco: Never Used  . Alcohol Use: Yes     Comment: occasionally  . Drug Use: No  . Sexual Activity: Yes   Other Topics Concern  . None   Social History Narrative   Lives in [place]. Has 4 children. 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  Eyes: Negative for visual disturbance.  Respiratory: Negative for shortness of breath.   Cardiovascular: Negative for  chest pain and leg swelling.  Gastrointestinal: Negative for abdominal pain.  Skin: Positive for color change and wound. Negative for rash.  Hematological: Positive for adenopathy. Does not bruise/bleed easily.  Psychiatric/Behavioral: Negative for dysphoric mood. The patient is not nervous/anxious.        Objective:    BP 100/60 mmHg  Pulse 82  Ht 5\' 3"  (1.6 m)  Wt 158 lb (71.668 kg)  BMI 28.00 kg/m2  SpO2 95% Physical Exam  Constitutional: She is oriented to person, place, and time. She appears well-developed and well-nourished. No distress.  HENT:  Head: Normocephalic and atraumatic.  Right Ear: External ear normal.  Left Ear: External ear normal.  Nose: Nose normal.    Mouth/Throat: Oropharynx is clear and moist. No oropharyngeal exudate.  Eyes: Conjunctivae are normal. Pupils are equal, round, and reactive to light. Right eye exhibits no discharge. Left eye exhibits no discharge. No scleral icterus.  Neck: Normal range of motion. Neck supple. No tracheal deviation present. No thyromegaly present.  Cardiovascular: Normal rate, regular rhythm, normal heart sounds and intact distal pulses.  Exam reveals no gallop and no friction rub.  No murmur heard. Pulmonary/Chest: Effort normal and breath sounds normal. No respiratory distress. She has no wheezes. She has no rales. She exhibits no tenderness.  Musculoskeletal: Normal range of motion. She exhibits no edema or tenderness.  Lymphadenopathy:    She has no cervical adenopathy.  Neurological: She is alert and oriented to person, place, and time. No cranial nerve deficit. She exhibits normal muscle tone. Coordination normal.  Skin: Skin is warm and dry. No rash noted. She is not diaphoretic. No erythema. No pallor.  Psychiatric: She has a normal mood and affect. Her behavior is normal. Judgment and thought content normal.          Assessment & Plan:   Problem List Items Addressed This Visit      Unprioritized    Cellulitis of face - Primary    Cluster of pustules medial right nare. Draining small amount of fluid. Encouraged use of warm compresses to allow remainder of area to drain. Continue topical gentamicin. Start Doxycycline bid. She will email or call with follow up later today or tomorrow and prn.      Relevant Medications   doxycycline (VIBRA-TABS) 100 MG tablet       Return if symptoms worsen or fail to improve.  Ronette Deter, MD Internal Medicine [facility]

## 2015-08-16 NOTE — Assessment & Plan Note (Signed)
Cluster of pustules medial right nare. Draining small amount of fluid. Encouraged use of warm compresses to allow remainder of area to drain. Continue topical gentamicin. Start Doxycycline bid. She will email or call with follow up later today or tomorrow and prn.

## 2015-08-16 NOTE — Patient Instructions (Signed)
Apply warm compresses to face today.  Start Doxycycline twice daily.  Email later today with update.

## 2015-08-24 ENCOUNTER — Other Ambulatory Visit: Payer: Self-pay | Admitting: Internal Medicine

## 2015-08-24 NOTE — Telephone Encounter (Signed)
Given 08/16/15 for an acute issue. Please advise?

## 2015-08-24 NOTE — Telephone Encounter (Signed)
Can you ask her if her infection has improved? She should not need additional doxycycline.

## 2015-09-26 ENCOUNTER — Ambulatory Visit (INDEPENDENT_AMBULATORY_CARE_PROVIDER_SITE_OTHER): Payer: Managed Care, Other (non HMO) | Admitting: Internal Medicine

## 2015-09-26 ENCOUNTER — Ambulatory Visit: Payer: Self-pay | Admitting: Internal Medicine

## 2015-09-26 ENCOUNTER — Encounter: Payer: Self-pay | Admitting: Internal Medicine

## 2015-09-26 VITALS — BP 118/78 | HR 65 | Ht 63.0 in | Wt 153.4 lb

## 2015-09-26 DIAGNOSIS — E039 Hypothyroidism, unspecified: Secondary | ICD-10-CM

## 2015-09-26 DIAGNOSIS — E119 Type 2 diabetes mellitus without complications: Secondary | ICD-10-CM

## 2015-09-26 DIAGNOSIS — E663 Overweight: Secondary | ICD-10-CM | POA: Diagnosis not present

## 2015-09-26 LAB — COMPREHENSIVE METABOLIC PANEL
ALBUMIN: 4.3 g/dL (ref 3.5–5.2)
ALT: 20 U/L (ref 0–35)
AST: 26 U/L (ref 0–37)
Alkaline Phosphatase: 57 U/L (ref 39–117)
BILIRUBIN TOTAL: 0.5 mg/dL (ref 0.2–1.2)
BUN: 20 mg/dL (ref 6–23)
CALCIUM: 9.3 mg/dL (ref 8.4–10.5)
CO2: 30 meq/L (ref 19–32)
CREATININE: 0.74 mg/dL (ref 0.40–1.20)
Chloride: 102 mEq/L (ref 96–112)
GFR: 86.01 mL/min (ref >60.00)
Glucose, Bld: 75 mg/dL (ref 70–99)
Potassium: 4.3 mEq/L (ref 3.5–5.1)
Sodium: 140 mEq/L (ref 135–145)
Total Protein: 7 g/dL (ref 6.0–8.3)

## 2015-09-26 LAB — HEMOGLOBIN A1C: Hgb A1c MFr Bld: 5.4 % (ref 4.6–6.5)

## 2015-09-26 LAB — TSH: TSH: 0.21 u[IU]/mL — AB (ref 0.35–4.50)

## 2015-09-26 NOTE — Patient Instructions (Signed)
Labs today.  Follow up in 4-6 weeks.

## 2015-09-26 NOTE — Progress Notes (Signed)
Subjective:    Patient ID: Lindsay Mejia, female    DOB: 19-Sep-1960, 55 y.o.   MRN: [id number]  HPI  55YO female presents for follow up.  Recently seen for cellulitis. Started on Doxycycline. Symptoms resolved.  Feeling well. Taking Byetta 62mcg bid. Occasional indigestion with medication, however improved with eating small meal. No persistent abdominal pain, NV. Does not regularly check BG.  Lab Results  Component Value Date   HGBA1C 5.8 06/27/2015     Wt Readings from Last 3 Encounters:  09/26/15 153 lb 6.4 oz (69.582 kg)  08/16/15 158 lb (71.668 kg)  06/27/15 164 lb 3.2 oz (74.481 kg)   BP Readings from Last 3 Encounters:  09/26/15 118/78  08/16/15 100/60  06/27/15 107/68    Past Medical History  Diagnosis Date  . Thyroid disease     s/p XRT to thyroid, Dr. Sabra Heck at [facility]  . Hyperlipidemia   . Hypertension   . Rosacea     Dr. Lovell Sheehan  . Anxiety   . Diabetes mellitus without complication ([place])   . Hypothyroidism    Family History  Problem Relation Age of Onset  . Hyperlipidemia Mother   . Hypertension Mother   . Breast cancer Mother 67  . Hypertension Father   . Heart disease Father   . Hypertension Brother    Past Surgical History  Procedure Laterality Date  . Hernia repair      abdominal  . Knee arthroscopy w/ meniscal repair      2  . Appendectomy    . Breast surgery    . Cesarean section      4, Dr. Buford Dresser  . Breast biopsy Right 2006    neg   Social History   Social History  . Marital Status: Married    Spouse Name: N/A  . Number of Children: N/A  . Years of Education: N/A   Social History Main Topics  . Smoking status: Never Smoker   . Smokeless tobacco: Never Used  . Alcohol Use: Yes     Comment: occasionally  . Drug Use: No  . Sexual Activity: Yes   Other Topics Concern  . None   Social History Narrative   Lives in [place]. Has 4 children. 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  Eyes: Negative for visual disturbance.  Respiratory: Negative for shortness of breath.   Cardiovascular: Negative for chest pain and leg swelling.  Gastrointestinal: Negative for nausea, vomiting, abdominal pain, diarrhea, constipation, blood in stool and abdominal distention.  Skin: Negative for color change and rash.  Hematological: Negative for adenopathy. Does not bruise/bleed easily.  Psychiatric/Behavioral: Negative for dysphoric mood. The patient is not nervous/anxious.        Objective:    BP 118/78 mmHg  Pulse 65  Ht 5\' 3"  (1.6 m)  Wt 153 lb 6.4 oz (69.582 kg)  BMI 27.18 kg/m2  SpO2 99% Physical Exam  Constitutional: She is oriented to person, place, and time. She appears well-developed and well-nourished. No distress.  HENT:  Head: Normocephalic and atraumatic.  Right Ear: External ear normal.  Left Ear: External ear normal.  Nose: Nose normal.  Mouth/Throat: Oropharynx is clear and moist. No oropharyngeal exudate.  Eyes: Conjunctivae are normal. Pupils are equal, round, and reactive to light. Right eye exhibits no discharge. Left eye exhibits no discharge. No scleral icterus.  Neck: Normal range of motion. Neck supple. No tracheal deviation present. No thyromegaly present.  Cardiovascular: Normal rate, regular rhythm, normal heart sounds and intact distal pulses.  Exam reveals no gallop and no friction rub.   No murmur heard. Pulmonary/Chest: Effort normal and breath sounds normal. No respiratory distress. She has no wheezes. She has no rales. She exhibits no tenderness.  Musculoskeletal: Normal range of motion. She exhibits no edema or tenderness.  Lymphadenopathy:    She has no cervical adenopathy.  Neurological: She is alert and oriented to person, place, and time. No cranial nerve deficit. She exhibits normal muscle tone. Coordination normal.  Skin: Skin is warm and dry. No rash noted. She is  not diaphoretic. No erythema. No pallor.  Psychiatric: She has a normal mood and affect. Her behavior is normal. Judgment and thought content normal.          Assessment & Plan:   Problem List Items Addressed This Visit      Unprioritized   Diabetes type 2, controlled ([place]) - Primary    Will check A1c with labs. Continue Byetta.      Relevant Orders   Comprehensive metabolic panel   Hemoglobin A1c   TSH   Hypothyroidism    TSH with labs. Continue Synthroid.      Overweight (BMI 25.0-29.9)    Wt Readings from Last 3 Encounters:  09/26/15 153 lb 6.4 oz (69.582 kg)  08/16/15 158 lb (71.668 kg)  06/27/15 164 lb 3.2 oz (74.481 kg)   Body mass index is 27.18 kg/(m^2). Congratulated pt on weight loss. Continue Byetta, which has helped with appetite.          Return in about 6 weeks (around 11/07/2015) for Recheck.  Ronette Deter, MD Internal Medicine [facility]

## 2015-09-26 NOTE — Assessment & Plan Note (Signed)
TSH with labs. Continue Synthroid.

## 2015-09-26 NOTE — Assessment & Plan Note (Signed)
Wt Readings from Last 3 Encounters:  09/26/15 153 lb 6.4 oz (69.582 kg)  08/16/15 158 lb (71.668 kg)  06/27/15 164 lb 3.2 oz (74.481 kg)   Body mass index is 27.18 kg/(m^2). Congratulated pt on weight loss. Continue Byetta, which has helped with appetite.

## 2015-09-26 NOTE — Progress Notes (Signed)
Pre visit review using our clinic review tool, if applicable. No additional management support is needed unless otherwise documented below in the visit note. 

## 2015-09-26 NOTE — Assessment & Plan Note (Signed)
Will check A1c with labs. Continue Byetta.

## 2015-10-28 ENCOUNTER — Other Ambulatory Visit: Payer: Self-pay | Admitting: Internal Medicine

## 2015-11-09 ENCOUNTER — Ambulatory Visit (INDEPENDENT_AMBULATORY_CARE_PROVIDER_SITE_OTHER): Payer: Managed Care, Other (non HMO) | Admitting: Internal Medicine

## 2015-11-09 VITALS — BP 122/74 | HR 67 | Ht 63.0 in | Wt 151.4 lb

## 2015-11-09 DIAGNOSIS — E119 Type 2 diabetes mellitus without complications: Secondary | ICD-10-CM

## 2015-11-09 DIAGNOSIS — E663 Overweight: Secondary | ICD-10-CM

## 2015-11-09 DIAGNOSIS — E039 Hypothyroidism, unspecified: Secondary | ICD-10-CM | POA: Diagnosis not present

## 2015-11-09 DIAGNOSIS — I10 Essential (primary) hypertension: Secondary | ICD-10-CM | POA: Diagnosis not present

## 2015-11-09 LAB — TSH: TSH: 0.24 u[IU]/mL — AB (ref 0.35–4.50)

## 2015-11-09 LAB — T4, FREE: Free T4: 1.32 ng/dL (ref 0.60–1.60)

## 2015-11-09 NOTE — Progress Notes (Signed)
Pre visit review using our clinic review tool, if applicable. No additional management support is needed unless otherwise documented below in the visit note. 

## 2015-11-09 NOTE — Assessment & Plan Note (Signed)
BP Readings from Last 3 Encounters:  11/09/15 122/74  09/26/15 118/78  08/16/15 100/60   BP well controlled. Continue current medications.

## 2015-11-09 NOTE — Assessment & Plan Note (Signed)
Wt Readings from Last 3 Encounters:  11/09/15 151 lb 6.4 oz (68.7 kg)  09/26/15 153 lb 6.4 oz (69.6 kg)  08/16/15 158 lb (71.7 kg)   Congratulated pt on weight loss. Continue Byetta to help with appetite suppression.

## 2015-11-09 NOTE — Assessment & Plan Note (Signed)
BG well controlled with Byetta. Will continue.  Lab Results  Component Value Date   HGBA1C 5.4 09/26/2015

## 2015-11-09 NOTE — Progress Notes (Signed)
Subjective:    Patient ID: Lindsay Mejia, female    DOB: 04-06-1961, 55 y.o.   MRN: [id number]  HPI  55YO female presents for follow up.  Feeling well. No concerns today. Continues to follow a healthy diet and stays active.   Lab Results  Component Value Date   HGBA1C 5.4 09/26/2015     Wt Readings from Last 3 Encounters:  11/09/15 151 lb 6.4 oz (68.7 kg)  09/26/15 153 lb 6.4 oz (69.6 kg)  08/16/15 158 lb (71.7 kg)   BP Readings from Last 3 Encounters:  11/09/15 122/74  09/26/15 118/78  08/16/15 100/60    Past Medical History:  Diagnosis Date  . Anxiety   . Diabetes mellitus without complication ([place])   . Hyperlipidemia   . Hypertension   . Hypothyroidism   . Rosacea    Dr. Lovell Sheehan  . Thyroid disease    s/p XRT to thyroid, Dr. [facility] at [facility] History  Problem Relation Age of Onset  . Hyperlipidemia Mother   . Hypertension Mother   . Breast cancer Mother 18  . Hypertension Father   . Heart disease Father   . Hypertension Brother    Past Surgical History:  Procedure Laterality Date  . APPENDECTOMY    . BREAST BIOPSY Right 2006   neg  . BREAST SURGERY    . CESAREAN SECTION     4, Dr. Buford Dresser  . HERNIA REPAIR     abdominal  . KNEE ARTHROSCOPY W/ MENISCAL REPAIR     2   Social History   Social History  . Marital status: Married    Spouse name: N/A  . Number of children: N/A  . Years of education: N/A   Social History Main Topics  . Smoking status: Never Smoker  . Smokeless tobacco: Never Used  . Alcohol use Yes     Comment: occasionally  . Drug use: No  . Sexual activity: Yes   Other Topics Concern  . Not on file   Social History Narrative   Lives in [place]. Has 4 children. 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  Eyes: Negative for visual disturbance.  Respiratory: Negative for shortness of breath.     Cardiovascular: Negative for chest pain and leg swelling.  Gastrointestinal: Negative for abdominal pain, diarrhea, nausea and vomiting.  Musculoskeletal: Negative for arthralgias and myalgias.  Skin: Negative for color change and rash.  Hematological: Negative for adenopathy. Does not bruise/bleed easily.  Psychiatric/Behavioral: Negative for dysphoric mood and sleep disturbance. The patient is not nervous/anxious.        Objective:    BP 122/74 (BP Location: Left Arm, Patient Position: Sitting, Cuff Size: Large)   Pulse 67   Ht 5\' 3"  (1.6 m)   Wt 151 lb 6.4 oz (68.7 kg)   SpO2 99%   BMI 26.82 kg/m  Physical Exam  Constitutional: She is oriented to person, place, and time. She appears well-developed and well-nourished. No distress.  HENT:  Head: Normocephalic and atraumatic.  Right Ear: External ear normal.  Left Ear: External ear normal.  Nose: Nose normal.  Mouth/Throat: Oropharynx is clear and moist. No oropharyngeal exudate.  Eyes: Conjunctivae are normal. Pupils are equal, round, and reactive to light. Right eye exhibits no discharge. Left eye exhibits no discharge. No scleral icterus.  Neck: Normal range of motion. Neck  supple. No tracheal deviation present. No thyromegaly present.  Cardiovascular: Normal rate, regular rhythm, normal heart sounds and intact distal pulses.  Exam reveals no gallop and no friction rub.   No murmur heard. Pulmonary/Chest: Effort normal and breath sounds normal. No respiratory distress. She has no wheezes. She has no rales. She exhibits no tenderness.  Musculoskeletal: Normal range of motion. She exhibits no edema or tenderness.  Lymphadenopathy:    She has no cervical adenopathy.  Neurological: She is alert and oriented to person, place, and time. No cranial nerve deficit. She exhibits normal muscle tone. Coordination normal.  Skin: Skin is warm and dry. No rash noted. She is not diaphoretic. No erythema. No pallor.  Psychiatric: She has a  normal mood and affect. Her behavior is normal. Judgment and thought content normal.          Assessment & Plan:   Problem List Items Addressed This Visit      Unprioritized   Diabetes type 2, controlled ([place]) - Primary (Chronic)    BG well controlled with Byetta. Will continue.  Lab Results  Component Value Date   HGBA1C 5.4 09/26/2015         Essential hypertension, benign (Chronic)    BP Readings from Last 3 Encounters:  11/09/15 122/74  09/26/15 118/78  08/16/15 100/60   BP well controlled. Continue current medications.      Hypothyroidism    Will recheck TSH and free T4 with labs today.      Relevant Orders   T4, free   TSH   Overweight (BMI 25.0-29.9) (Chronic)    Wt Readings from Last 3 Encounters:  11/09/15 151 lb 6.4 oz (68.7 kg)  09/26/15 153 lb 6.4 oz (69.6 kg)  08/16/15 158 lb (71.7 kg)   Congratulated pt on weight loss. Continue Byetta to help with appetite suppression.       Other Visit Diagnoses   None.      Return in about 3 months (around 02/09/2016) for New Patient.  Ronette Deter, MD Internal Medicine [facility]

## 2015-11-09 NOTE — Assessment & Plan Note (Signed)
Will recheck TSH and free T4 with labs today.

## 2015-11-09 NOTE — Patient Instructions (Signed)
Labs today

## 2015-11-11 ENCOUNTER — Other Ambulatory Visit: Payer: Self-pay | Admitting: Internal Medicine

## 2015-11-14 ENCOUNTER — Other Ambulatory Visit: Payer: Self-pay | Admitting: Internal Medicine

## 2015-11-15 ENCOUNTER — Other Ambulatory Visit: Payer: Self-pay

## 2015-11-24 ENCOUNTER — Telehealth: Payer: Self-pay | Admitting: Internal Medicine

## 2015-11-24 MED ORDER — METFORMIN HCL 500 MG PO TABS: 500.0000 mg | ORAL_TABLET | Freq: Two times a day (BID) | ORAL | 6 refills | Status: DC

## 2015-11-24 MED ORDER — ESCITALOPRAM OXALATE 10 MG PO TABS: 10.0000 mg | ORAL_TABLET | Freq: Every day | ORAL | 6 refills | Status: DC

## 2015-11-24 MED ORDER — SYNTHROID 88 MCG PO TABS: ORAL_TABLET | ORAL | 6 refills | Status: DC

## 2015-11-24 MED ORDER — ASPIRIN 81 MG PO TABS: 81.0000 mg | ORAL_TABLET | Freq: Every day | ORAL | 6 refills | Status: DC

## 2015-11-24 MED ORDER — EXENATIDE 5 MCG/0.02ML ~~LOC~~ SOPN: 10.0000 ug | PEN_INJECTOR | Freq: Two times a day (BID) | SUBCUTANEOUS | 5 refills | Status: DC

## 2015-11-24 MED ORDER — LISINOPRIL-HYDROCHLOROTHIAZIDE 10-12.5 MG PO TABS: 1.0000 | ORAL_TABLET | Freq: Every day | ORAL | 6 refills | Status: DC

## 2015-11-24 NOTE — Telephone Encounter (Signed)
Pt is a former Environmental consultant pt. She has refills on her medications. She can not get them refilled because Dr. Arvil Persons. Please call pt.

## 2015-11-24 NOTE — Telephone Encounter (Signed)
Spoke with the patient, refilled all her Rx's till her next appt in [place]. thanks

## 2015-11-28 ENCOUNTER — Telehealth: Payer: Self-pay | Admitting: Family

## 2015-11-28 ENCOUNTER — Other Ambulatory Visit: Payer: Self-pay

## 2015-11-28 MED ORDER — EXENATIDE 5 MCG/0.02ML ~~LOC~~ SOPN: 10.0000 ug | PEN_INJECTOR | Freq: Two times a day (BID) | SUBCUTANEOUS | 5 refills | Status: DC

## 2015-11-28 NOTE — Telephone Encounter (Signed)
Ronalee Belts from [employer] called saying multiple times he's received a Rx for Ms. Melching for [facility] for the Byetta even though the pens come in [facility] He's asking is this a mistake or does M. Arnette want the patient to take two doses of 5MCG? He'd like a return phone call concerning this.  Mike's ph# [phone] Thank you.

## 2015-11-28 NOTE — Telephone Encounter (Signed)
Please advise 

## 2015-11-29 ENCOUNTER — Telehealth: Payer: Self-pay

## 2015-11-29 NOTE — Telephone Encounter (Signed)
Patient does take Byetta 10MCG twice aday.  She does take metformin still and her levels have been normal while being on medication.

## 2015-11-29 NOTE — Telephone Encounter (Signed)
-----   Message from Burnard Hawthorne, [place] sent at 11/28/2015  9:02 PM EDT ----- Would you call patient and confirm that she takes -  Byetta 42mcg [place] BID ; she should take one dose with morning meal and the second with pm meal.    How long has she been on this med? Does she low blood sugars?  She is also taking her Metformin as well?   I want to refill Byetta with her [facility] however there was a discrepancy when I went to refill.

## 2015-11-29 NOTE — Telephone Encounter (Signed)
Thanks for clarifying dose.  Would you call [facility] back and let pharmacist know that I have discontinued byetta 67mcg pen ;  The correct order for byetta 46mcg BID is on patient's chart.

## 2015-11-30 NOTE — Telephone Encounter (Signed)
Pharmacy has been notified.

## 2016-01-09 ENCOUNTER — Other Ambulatory Visit: Payer: Self-pay

## 2016-01-09 MED ORDER — BD PEN NEEDLE NANO U/F 32G X 4 MM MISC: 3 refills | Status: DC

## 2016-01-09 NOTE — Telephone Encounter (Signed)
Medication has been refilled.

## 2016-04-18 ENCOUNTER — Encounter: Payer: Self-pay | Admitting: Family

## 2016-04-18 ENCOUNTER — Ambulatory Visit (INDEPENDENT_AMBULATORY_CARE_PROVIDER_SITE_OTHER): Payer: Managed Care, Other (non HMO) | Admitting: Family

## 2016-04-18 VITALS — BP 110/78 | HR 76 | Temp 98.8°F | Wt 148.2 lb

## 2016-04-18 DIAGNOSIS — B001 Herpesviral vesicular dermatitis: Secondary | ICD-10-CM | POA: Diagnosis not present

## 2016-04-18 DIAGNOSIS — E039 Hypothyroidism, unspecified: Secondary | ICD-10-CM

## 2016-04-18 DIAGNOSIS — I10 Essential (primary) hypertension: Secondary | ICD-10-CM

## 2016-04-18 DIAGNOSIS — E119 Type 2 diabetes mellitus without complications: Secondary | ICD-10-CM | POA: Diagnosis not present

## 2016-04-18 DIAGNOSIS — Z23 Encounter for immunization: Secondary | ICD-10-CM | POA: Diagnosis not present

## 2016-04-18 LAB — COMPREHENSIVE METABOLIC PANEL
ALT: 18 U/L (ref 0–35)
AST: 20 U/L (ref 0–37)
Albumin: 4.1 g/dL (ref 3.5–5.2)
Alkaline Phosphatase: 67 U/L (ref 39–117)
BILIRUBIN TOTAL: 0.6 mg/dL (ref 0.2–1.2)
BUN: 12 mg/dL (ref 6–23)
CALCIUM: 9.4 mg/dL (ref 8.4–10.5)
CHLORIDE: 100 meq/L (ref 96–112)
CO2: 32 meq/L (ref 19–32)
CREATININE: 0.72 mg/dL (ref 0.40–1.20)
GFR: 88.6 mL/min (ref >60.00)
Glucose, Bld: 114 mg/dL — ABNORMAL HIGH (ref 70–99)
Potassium: 4 mEq/L (ref 3.5–5.1)
SODIUM: 141 meq/L (ref 135–145)
Total Protein: 7 g/dL (ref 6.0–8.3)

## 2016-04-18 LAB — CBC WITH DIFFERENTIAL/PLATELET
Basophils Absolute: 0 10*3/uL (ref 0.0–0.1)
Basophils Relative: 0.3 % (ref 0.0–3.0)
EOS ABS: 0.2 10*3/uL (ref 0.0–0.7)
Eosinophils Relative: 2.6 % (ref 0.0–5.0)
HEMATOCRIT: 38.1 % (ref 36.0–46.0)
HEMOGLOBIN: 13.1 g/dL (ref 12.0–15.0)
LYMPHS PCT: 17.2 % (ref 12.0–46.0)
Lymphs Abs: 1.4 10*3/uL (ref 0.7–4.0)
MCHC: 34.4 g/dL (ref 30.0–36.0)
MCV: 87.8 fl (ref 78.0–100.0)
MONO ABS: 0.8 10*3/uL (ref 0.1–1.0)
Monocytes Relative: 9.8 % (ref 3.0–12.0)
NEUTROS ABS: 5.8 10*3/uL (ref 1.4–7.7)
Neutrophils Relative %: 70.1 % (ref 43.0–77.0)
PLATELETS: 291 10*3/uL (ref 150.0–400.0)
RBC: 4.34 Mil/uL (ref 3.87–5.11)
RDW: 13.6 % (ref 11.5–15.5)
WBC: 8.3 10*3/uL (ref 4.0–10.5)

## 2016-04-18 LAB — LIPID PANEL
CHOL/HDL RATIO: 4
Cholesterol: 164 mg/dL (ref 0–200)
HDL: 44.5 mg/dL (ref >39.00)
LDL Cholesterol: 103 mg/dL — ABNORMAL HIGH (ref 0–99)
NONHDL: 119.52
Triglycerides: 85 mg/dL (ref 0.0–149.0)
VLDL: 17 mg/dL (ref 0.0–40.0)

## 2016-04-18 LAB — HEMOGLOBIN A1C: Hgb A1c MFr Bld: 5.5 % (ref 4.6–6.5)

## 2016-04-18 LAB — T4, FREE: FREE T4: 1.33 ng/dL (ref 0.60–1.60)

## 2016-04-18 LAB — TSH: TSH: 0.98 u[IU]/mL (ref 0.35–4.50)

## 2016-04-18 LAB — VITAMIN D 25 HYDROXY (VIT D DEFICIENCY, FRACTURES): VITD: 47.5 ng/mL (ref 30.00–100.00)

## 2016-04-18 MED ORDER — VALACYCLOVIR HCL 1 G PO TABS: ORAL_TABLET | ORAL | 2 refills | Status: DC

## 2016-04-18 NOTE — Assessment & Plan Note (Signed)
H/o. Requesting refill.

## 2016-04-18 NOTE — Assessment & Plan Note (Signed)
Last TSH mildly depressed. Will redraw today. Clinically asymptomatic.

## 2016-04-18 NOTE — Progress Notes (Signed)
Pre visit review using our clinic review tool, if applicable. No additional management support is needed unless otherwise documented below in the visit note. 

## 2016-04-18 NOTE — Progress Notes (Signed)
Subjective:    Patient ID: Lindsay Mejia, female    DOB: May 11, 1960, 56 y.o.   MRN: [id number]  CC: Lindsay Mejia is a 56 y.o. female who presents today for follow up.   HPI: DM- compliant with medication. No numbness, tingling. Due for eye exam.    HTN-compliant with medication  Hypothyroidism- Compliant with medication. No palpitations, cold/heat intolerance, constipation.       HISTORY:  Past Medical History:  Diagnosis Date  . Anxiety   . Diabetes mellitus without complication ([place])   . Hyperlipidemia   . Hypertension   . Hypothyroidism   . Rosacea    Dr. Lovell Sheehan  . Thyroid disease 2000   s/p XRT to thyroid, Dr. Sabra Heck at [facility]   Past Surgical History:  Procedure Laterality Date  . APPENDECTOMY    . BREAST BIOPSY Right 2006   neg  . BREAST SURGERY    . CESAREAN SECTION     4, Dr. Buford Dresser  . HERNIA REPAIR     abdominal  . KNEE ARTHROSCOPY W/ MENISCAL REPAIR     2   Family History  Problem Relation Age of Onset  . Hyperlipidemia Mother   . Hypertension Mother   . Breast cancer Mother 97  . Hypertension Father   . Heart disease Father   . Hypertension Brother     Allergies: Atorvastatin Current Outpatient Prescriptions on File Prior to Visit  Medication Sig Dispense Refill  . aspirin 81 MG tablet Take 1 tablet (81 mg total) by mouth daily. 30 tablet 6  . BD PEN NEEDLE NANO U/F 32G X 4 MM MISC U UTD 100 each 3  . BIOTIN PO Take by mouth.    . Blood Glucose Monitoring Suppl (ONE TOUCH ULTRA SYSTEM KIT) W/DEVICE KIT 1 kit by Does not apply route once. 1 each 0  . BYETTA 10 MCG PEN 10 MCG/0.04ML SOPN injection INJECT 10 MCG UNDER THE SKIN TWICE DAILY WITH A MEAL 2.4 mL 0  . Cyanocobalamin (B-12 PO) Take by mouth daily. Reported on 06/27/2015    . escitalopram (LEXAPRO) 10 MG tablet Take 1 tablet (10 mg total) by mouth daily. 30 tablet 6  . gentamicin ointment (GARAMYCIN) 0.1 % Apply 1 application topically 3 (three) times daily. 15 g 0  .  lisinopril-hydrochlorothiazide (PRINZIDE,ZESTORETIC) 10-12.5 MG tablet Take 1 tablet by mouth daily. 30 tablet 6  . metFORMIN (GLUCOPHAGE) 500 MG tablet Take 1 tablet (500 mg total) by mouth 2 (two) times daily with a meal. 60 tablet 6  . Multiple Vitamin (MULTIVITAMIN) tablet Take 1 tablet by mouth daily.    . Omega-3 Fatty Acids (FISH OIL) 1200 MG CAPS Take 1,200 mg by mouth daily. Reported on 06/14/2015    . SYNTHROID 88 MCG tablet TAKE 1 TABLET BY MOUTH EVERY MORNING BEFORE BREAKFAST 30 tablet 6   No current facility-administered medications on file prior to visit.     Social History  Substance Use Topics  . Smoking status: Never Smoker  . Smokeless tobacco: Never Used  . Alcohol use Yes     Comment: occasionally    Review of Systems  Constitutional: Negative for chills and fever.  Respiratory: Negative for cough.   Cardiovascular: Negative for chest pain and palpitations.  Gastrointestinal: Negative for nausea and vomiting.  Endocrine: Negative for cold intolerance and heat intolerance.      Objective:    BP 110/78 (BP Location: Right Arm, Patient Position: Sitting, Cuff Size: Normal)   Pulse 76  Temp 98.8 F (37.1 C) (Oral)   Wt 148 lb 3.2 oz (67.2 kg)   SpO2 98%   BMI 26.25 kg/m  BP Readings from Last 3 Encounters:  04/18/16 110/78  11/09/15 122/74  09/26/15 118/78   Wt Readings from Last 3 Encounters:  04/18/16 148 lb 3.2 oz (67.2 kg)  11/09/15 151 lb 6.4 oz (68.7 kg)  09/26/15 153 lb 6.4 oz (69.6 kg)    Physical Exam  Constitutional: She appears well-developed and well-nourished.  Eyes: Conjunctivae are normal.  Neck: No thyroid mass and no thyromegaly present.  Cardiovascular: Normal rate, regular rhythm, normal heart sounds and normal pulses.   Pulmonary/Chest: Effort normal and breath sounds normal. She has no wheezes. She has no rhonchi. She has no rales.  Lymphadenopathy:       Head (right side): No submental, no submandibular, no tonsillar, no  preauricular, no posterior auricular and no occipital adenopathy present.       Head (left side): No submental, no submandibular, no tonsillar, no preauricular, no posterior auricular and no occipital adenopathy present.    She has no cervical adenopathy.  Neurological: She is alert.  Skin: Skin is warm and dry.  Psychiatric: She has a normal mood and affect. Her speech is normal and behavior is normal. Thought content normal.  Vitals reviewed.      Assessment & Plan:   Problem List Items Addressed This Visit      Cardiovascular and Mediastinum   Essential hypertension, benign - Primary (Chronic)    Well controlled. Continue current regimen.       Relevant Orders   CBC with Differential/Platelet   Comprehensive metabolic panel   Hemoglobin A1c   Hepatitis C antibody   HIV antibody   Lipid panel   TSH   VITAMIN D 25 Hydroxy (Vit-D Deficiency, Fractures)     Digestive   Herpes labialis    H/o. Requesting refill.       Relevant Medications   valACYclovir (VALTREX) 1000 MG tablet     Endocrine   Diabetes type 2, controlled (HCC) (Chronic)    Pending a1c.       Hypothyroidism    Last TSH mildly depressed. Will redraw today. Clinically asymptomatic.       Relevant Orders   TSH   T4, free       I am having Lindsay Mejia start on valACYclovir. I am also having her maintain her multivitamin, Fish Oil, ONE TOUCH ULTRA SYSTEM KIT, BIOTIN PO, Cyanocobalamin (B-12 PO), gentamicin ointment, BYETTA 10 MCG PEN, SYNTHROID, metFORMIN, lisinopril-hydrochlorothiazide, escitalopram, aspirin, and BD PEN NEEDLE NANO U/F.   Meds ordered this encounter  Medications  . valACYclovir (VALTREX) 1000 MG tablet    Sig: Take two tablets ( total 2000 mg) by mouth q12h x 1 day; Start: ASAP after symptom onset    Dispense:  6 tablet    Refill:  2    Order Specific Question:   Supervising Provider    Answer:   Crecencio Mc [[id number]]    Return precautions given.   Risks, benefits, and  alternatives of the medications and treatment plan prescribed today were discussed, and patient expressed understanding.   Education regarding symptom management and diagnosis given to patient on AVS.  Continue to follow with Mable Paris, FNP for routine health maintenance.   Ky Barban and I agreed with plan.   Mable Paris, FNP

## 2016-04-18 NOTE — Assessment & Plan Note (Signed)
Well-controlled.  Continue current regimen. 

## 2016-04-18 NOTE — Assessment & Plan Note (Signed)
Pending a1c. 

## 2016-04-18 NOTE — Patient Instructions (Addendum)
Please call dr Tiffany Kocher and confirm when your repeat colonoscopy- are you 5 years OR 10 years? Please call me and let me know.   Labs  Pleasure meeting you

## 2016-04-19 ENCOUNTER — Encounter: Payer: Self-pay | Admitting: Family

## 2016-04-19 LAB — HIV ANTIBODY (ROUTINE TESTING W REFLEX): HIV 1&2 Ab, 4th Generation: NONREACTIVE

## 2016-04-19 LAB — HEPATITIS C ANTIBODY: HCV Ab: NEGATIVE

## 2016-04-23 ENCOUNTER — Encounter: Payer: Self-pay | Admitting: Family

## 2016-04-25 ENCOUNTER — Telehealth: Payer: Self-pay | Admitting: Family

## 2016-04-25 DIAGNOSIS — E785 Hyperlipidemia, unspecified: Secondary | ICD-10-CM

## 2016-04-25 MED ORDER — PRAVASTATIN SODIUM 40 MG PO TABS: 40.0000 mg | ORAL_TABLET | Freq: Every day | ORAL | 2 refills | Status: DC

## 2016-04-25 NOTE — Telephone Encounter (Signed)
closing

## 2016-04-26 ENCOUNTER — Ambulatory Visit (INDEPENDENT_AMBULATORY_CARE_PROVIDER_SITE_OTHER): Payer: Managed Care, Other (non HMO)

## 2016-04-26 ENCOUNTER — Ambulatory Visit (INDEPENDENT_AMBULATORY_CARE_PROVIDER_SITE_OTHER): Payer: Managed Care, Other (non HMO) | Admitting: Podiatry

## 2016-04-26 ENCOUNTER — Encounter: Payer: Self-pay | Admitting: Podiatry

## 2016-04-26 VITALS — BP 116/69 | HR 75 | Resp 18

## 2016-04-26 DIAGNOSIS — M722 Plantar fascial fibromatosis: Secondary | ICD-10-CM | POA: Diagnosis not present

## 2016-04-26 DIAGNOSIS — L84 Corns and callosities: Secondary | ICD-10-CM | POA: Diagnosis not present

## 2016-04-26 DIAGNOSIS — L851 Acquired keratosis [keratoderma] palmaris et plantaris: Secondary | ICD-10-CM | POA: Diagnosis not present

## 2016-04-26 DIAGNOSIS — M79672 Pain in left foot: Secondary | ICD-10-CM

## 2016-04-26 DIAGNOSIS — M7752 Other enthesopathy of left foot: Secondary | ICD-10-CM

## 2016-05-02 MED ORDER — BETAMETHASONE SOD PHOS & ACET 6 (3-3) MG/ML IJ SUSP: 3.0000 mg | Freq: Once | INTRAMUSCULAR | Status: AC

## 2016-05-02 NOTE — Progress Notes (Signed)
   Subjective:  Patient presents today for new patient for evaluation of left foot pain that is been going on for several weeks. There are no alleviating factors. Patient is concerned because she is going on a cruise and she wants to feel better but time she leaves.    Objective/Physical Exam General: The patient is alert and oriented x3 in no acute distress.  Dermatology: Skin is warm, dry and supple bilateral lower extremities. Negative for open lesions or macerations.  Vascular: Palpable pedal pulses bilaterally. No edema or erythema noted. Capillary refill within normal limits.  Neurological: Epicritic and protective threshold grossly intact bilaterally.   Musculoskeletal Exam: Pain on palpation and range of motion to the fifth MPJ left foot consistent with a fifth MPJ capsulitis. There is also an underlying hyperkeratotic callus lesion noted on the weightbearing surface of the fifth MPJ left foot. Range of motion within normal limits to all pedal and ankle joints bilateral. Muscle strength 5/5 in all groups bilateral.   Radiographic Exam:  Normal osseous mineralization. Joint spaces preserved. No fracture/dislocation/boney destruction.    Assessment: #1 fifth MPJ capsulitis left foot #2 symptomatic callus sub-fifth MPJ left foot #3 pain in left foot   Plan of Care:  #1 Patient was evaluated. #2 injection of 0.5 mL Celestone Soluspan injected in the fifth MPJ of the patient's left foot #3 excisional debridement of the painful callus lesion was performed using a chisel blade without incident #4 return to clinic in 4 weeks  ask patient about her cruise   Edrick Kins, DPM [facility]  Dr. Edrick Kins, [address]                                        [place],  [address]                Office [phone]  Fax [phone]

## 2016-05-09 ENCOUNTER — Telehealth: Payer: Self-pay | Admitting: Family

## 2016-05-09 NOTE — Telephone Encounter (Signed)
Patient has been taken medication every other day.

## 2016-05-09 NOTE — Telephone Encounter (Signed)
Call pt  Ensure she got rx for cholesterol med  thanks

## 2016-05-24 ENCOUNTER — Other Ambulatory Visit: Payer: Self-pay | Admitting: Obstetrics and Gynecology

## 2016-05-24 ENCOUNTER — Ambulatory Visit: Payer: Managed Care, Other (non HMO) | Admitting: Podiatry

## 2016-05-24 DIAGNOSIS — Z1231 Encounter for screening mammogram for malignant neoplasm of breast: Secondary | ICD-10-CM

## 2016-06-09 ENCOUNTER — Other Ambulatory Visit: Payer: Self-pay | Admitting: Family

## 2016-06-13 ENCOUNTER — Encounter: Payer: Self-pay | Admitting: *Deleted

## 2016-06-14 ENCOUNTER — Encounter: Payer: Self-pay | Admitting: Obstetrics and Gynecology

## 2016-06-14 ENCOUNTER — Ambulatory Visit (INDEPENDENT_AMBULATORY_CARE_PROVIDER_SITE_OTHER): Payer: Managed Care, Other (non HMO) | Admitting: Obstetrics and Gynecology

## 2016-06-14 ENCOUNTER — Other Ambulatory Visit: Payer: Self-pay | Admitting: Obstetrics and Gynecology

## 2016-06-14 VITALS — BP 102/67 | HR 69 | Ht 63.0 in | Wt 150.7 lb

## 2016-06-14 DIAGNOSIS — N760 Acute vaginitis: Secondary | ICD-10-CM

## 2016-06-14 DIAGNOSIS — B9689 Other specified bacterial agents as the cause of diseases classified elsewhere: Secondary | ICD-10-CM | POA: Diagnosis not present

## 2016-06-14 DIAGNOSIS — Z01419 Encounter for gynecological examination (general) (routine) without abnormal findings: Secondary | ICD-10-CM | POA: Diagnosis not present

## 2016-06-14 MED ORDER — CLINDAMYCIN PHOSPHATE (1 DOSE) 2 % VA CREA: 1.0000 | TOPICAL_CREAM | Freq: Two times a day (BID) | VAGINAL | 0 refills | Status: DC

## 2016-06-14 NOTE — Progress Notes (Signed)
Subjective:   Lindsay Mejia is a 56 y.o. G25P4 Caucasian female here for a routine well-woman exam.  No LMP recorded. Patient has had a hysterectomy.    Current complaints: urethral swelling with statin use PCP: [facility]       doesn't desire labs  Social History: Sexual: heterosexual Marital Status: married Living situation: with family Occupation: homemaker Tobacco/alcohol: no tobacco use Illicit drugs: no history of illicit drug use  The following portions of the patient's history were reviewed and updated as appropriate: allergies, current medications, past family history, past medical history, past social history, past surgical history and problem list.  Past Medical History Past Medical History:  Diagnosis Date  . Anxiety   . Diabetes mellitus without complication ([place])   . Hyperlipidemia   . Hypertension   . Hypothyroidism   . Rosacea    Dr. Lovell Sheehan  . Thyroid disease 2000   s/p XRT to thyroid, Dr. Sabra [facility] at [facility]    Past Surgical History Past Surgical History:  Procedure Laterality Date  . APPENDECTOMY    . BREAST BIOPSY Right 2006   neg  . BREAST SURGERY    . CESAREAN SECTION     4, Dr. Buford Dresser  . HERNIA REPAIR     abdominal  . KNEE ARTHROSCOPY W/ MENISCAL REPAIR     2    Gynecologic History G4P4  No LMP recorded. Patient has had a hysterectomy. Contraception: status post hysterectomy Last Pap: 2016. Results were: normal Last mammogram: 07/08/15. Results were: normal   Obstetric History OB History  Gravida Para Term Preterm AB Living  4 4          SAB TAB Ectopic Multiple Live Births               # Outcome Date GA Lbr Len/2nd Weight Sex Delivery Anes PTL Lv  4 Para      CS-Unspec     3 Para      CS-Unspec     2 Para      CS-Unspec     1 Para      CS-Unspec         Current Medications Current Outpatient Prescriptions on File Prior to Visit  Medication Sig Dispense Refill  . aspirin 81 MG tablet Take 1 tablet (81 mg total) by mouth  daily. 30 tablet 6  . BD PEN NEEDLE NANO U/F 32G X 4 MM MISC U UTD 100 each 3  . BIOTIN PO Take by mouth.    . Blood Glucose Monitoring Suppl (ONE TOUCH ULTRA SYSTEM KIT) W/DEVICE KIT 1 kit by Does not apply route once. 1 each 0  . BYETTA 10 MCG PEN 10 MCG/0.04ML SOPN injection INJECT 10 MCG UNDER THE SKIN TWICE DAILY WITH A MEAL 2.4 mL 0  . Cyanocobalamin (B-12 PO) Take by mouth daily. Reported on 06/27/2015    . escitalopram (LEXAPRO) 10 MG tablet Take 1 tablet (10 mg total) by mouth daily. 30 tablet 6  . lisinopril-hydrochlorothiazide (PRINZIDE,ZESTORETIC) 10-12.5 MG tablet Take 1 tablet by mouth daily. 30 tablet 6  . metFORMIN (GLUCOPHAGE) 500 MG tablet Take 1 tablet (500 mg total) by mouth 2 (two) times daily with a meal. 60 tablet 6  . Multiple Vitamin (MULTIVITAMIN) tablet Take 1 tablet by mouth daily.    . Omega-3 Fatty Acids (FISH OIL) 1200 MG CAPS Take 1,200 mg by mouth daily. Reported on 06/14/2015    . SYNTHROID 88 MCG tablet TAKE 1 TABLET BY MOUTH EVERY MORNING  BEFORE BREAKFAST 30 tablet 6  . valACYclovir (VALTREX) 1000 MG tablet Take two tablets ( total 2000 mg) by mouth q12h x 1 day; Start: ASAP after symptom onset 6 tablet 2  . gentamicin ointment (GARAMYCIN) 0.1 % Apply 1 application topically 3 (three) times daily. (Patient not taking: Reported on 06/14/2016) 15 g 0  . pravastatin (PRAVACHOL) 40 MG tablet Take 1 tablet (40 mg total) by mouth daily. (Patient not taking: Reported on 06/14/2016) 90 tablet 2   Current Facility-Administered Medications on File Prior to Visit  Medication Dose Route Frequency Provider Last Rate Last Dose  . betamethasone acetate-betamethasone sodium phosphate (CELESTONE) injection 3 mg  3 mg Intramuscular Once Edrick Kins, DPM        Review of Systems Patient denies any headaches, blurred vision, shortness of breath, chest pain, abdominal pain, problems with bowel movements, urination, or intercourse.  Objective:  BP 102/67   Pulse 69   Ht 5' 3"   (1.6 m)   Wt 150 lb 11.2 oz (68.4 kg)   BMI 26.70 kg/m  Physical Exam  General:  Well developed, well nourished, no acute distress. She is alert and oriented x3. Skin:  Warm and dry Neck:  Midline trachea, no thyromegaly or nodules Cardiovascular: Regular rate and rhythm, no murmur heard Lungs:  Effort normal, all lung fields clear to auscultation bilaterally Breasts:  No dominant palpable mass, retraction, or nipple discharge Abdomen:  Soft, non tender, no hepatosplenomegaly or masses Pelvic:  External genitalia is normal in appearance.  The vagina is normal in appearance. The cervix is surgically absent, but dark green scant discharge noted.  Microscopic wet-mount exam shows excessive bacteria, white blood cells..  Thin prep pap is not done . Uterus is surgically absent. No adnexal masses or tenderness noted. Extremities:  No swelling or varicosities noted Psych:  She has a normal mood and affect  Assessment:   Healthy well-woman exam BV S/P hysterectomy    Plan:  clindesse ordered, and nuSwab sent F/U 1 year  for AE, or sooner if needed Mammogram ordered or sooner if problems   Melody Rockney Ghee, CNM

## 2016-06-15 ENCOUNTER — Other Ambulatory Visit: Payer: Self-pay

## 2016-06-15 NOTE — Telephone Encounter (Signed)
Received a second refill request for the rxs that have already been ordered. The medications are pending so wanted to make sure it was ok to go ahead and send them or if they were pending for a certain reason. Please advise.

## 2016-06-18 NOTE — Telephone Encounter (Signed)
Spoke with the pt and she stated that she is taking her metformin 1 tablet twice a day. The pt also stated that she would like to know what you would decrease before she agreed to anything. Please advise.

## 2016-06-18 NOTE — Telephone Encounter (Signed)
Pt stated that she is willing to try decreasing the byetta to 58mcg twice daily. Can you send the new rx in. Please advise.

## 2016-06-18 NOTE — Telephone Encounter (Signed)
Pt is willing to try decreasing byetta to 77mcg twice daily. Can a new rx been sent in? Please advise.

## 2016-06-18 NOTE — Telephone Encounter (Signed)
LMTCB

## 2016-06-20 ENCOUNTER — Encounter: Payer: Self-pay | Admitting: Family

## 2016-06-22 ENCOUNTER — Other Ambulatory Visit: Payer: Self-pay | Admitting: Family

## 2016-07-06 ENCOUNTER — Other Ambulatory Visit: Payer: Self-pay | Admitting: Family

## 2016-07-09 ENCOUNTER — Ambulatory Visit
Admission: RE | Admit: 2016-07-09 | Discharge: 2016-07-09 | Disposition: A | Discharge status: Home / Self Care | Payer: Managed Care, Other (non HMO) | Source: Ambulatory Visit | Attending: Obstetrics and Gynecology | Admitting: Obstetrics and Gynecology

## 2016-07-09 DIAGNOSIS — Z1231 Encounter for screening mammogram for malignant neoplasm of breast: Secondary | ICD-10-CM | POA: Insufficient documentation

## 2016-07-12 ENCOUNTER — Other Ambulatory Visit: Payer: Self-pay

## 2016-07-12 MED ORDER — BD PEN NEEDLE NANO U/F 32G X 4 MM MISC: 3 refills | Status: DC

## 2016-07-12 NOTE — Telephone Encounter (Signed)
RX has been refilled 

## 2016-07-25 ENCOUNTER — Ambulatory Visit (INDEPENDENT_AMBULATORY_CARE_PROVIDER_SITE_OTHER): Payer: Managed Care, Other (non HMO) | Admitting: Family

## 2016-07-25 ENCOUNTER — Encounter: Payer: Self-pay | Admitting: Family

## 2016-07-25 VITALS — BP 120/68 | HR 62 | Temp 98.3°F | Ht 63.0 in | Wt 153.2 lb

## 2016-07-25 DIAGNOSIS — E785 Hyperlipidemia, unspecified: Secondary | ICD-10-CM | POA: Insufficient documentation

## 2016-07-25 DIAGNOSIS — E119 Type 2 diabetes mellitus without complications: Secondary | ICD-10-CM | POA: Diagnosis not present

## 2016-07-25 DIAGNOSIS — I10 Essential (primary) hypertension: Secondary | ICD-10-CM

## 2016-07-25 LAB — COMPREHENSIVE METABOLIC PANEL
ALK PHOS: 66 U/L (ref 39–117)
ALT: 17 U/L (ref 0–35)
AST: 18 U/L (ref 0–37)
Albumin: 4.5 g/dL (ref 3.5–5.2)
BILIRUBIN TOTAL: 0.7 mg/dL (ref 0.2–1.2)
BUN: 15 mg/dL (ref 6–23)
CO2: 32 mEq/L (ref 19–32)
Calcium: 9.8 mg/dL (ref 8.4–10.5)
Chloride: 99 mEq/L (ref 96–112)
Creatinine, Ser: 0.65 mg/dL (ref 0.40–1.20)
GFR: 99.61 mL/min (ref >60.00)
Glucose, Bld: 79 mg/dL (ref 70–99)
POTASSIUM: 4 meq/L (ref 3.5–5.1)
SODIUM: 139 meq/L (ref 135–145)
TOTAL PROTEIN: 7.5 g/dL (ref 6.0–8.3)

## 2016-07-25 LAB — HEMOGLOBIN A1C: HEMOGLOBIN A1C: 5.6 % (ref 4.6–6.5)

## 2016-07-25 NOTE — Progress Notes (Signed)
Pre visit review using our clinic review tool, if applicable. No additional management support is needed unless otherwise documented below in the visit note. 

## 2016-07-25 NOTE — Assessment & Plan Note (Signed)
Well-controlled.  Continue current regimen. 

## 2016-07-25 NOTE — Progress Notes (Signed)
Subjective:    Patient ID: Lindsay Mejia, female    DOB: 1960-10-07, 56 y.o.   MRN: [id number]  CC: Lindsay Mejia is a 56 y.o. female who presents today for follow up.   HPI: HLD-  Taking pravastatin once -twice per week. Tolerating well.   HTN- compliant. Denies exertional chest pain or pressure, numbness or tingling radiating to left arm or jaw, palpitations, dizziness, frequent headaches, changes in vision, or shortness of breath.    DM- compliant with medication. Doesn't check at home; manages with diet.     HISTORY:  Past Medical History:  Diagnosis Date  . Anxiety   . Diabetes mellitus without complication ([place])   . Hyperlipidemia   . Hypertension   . Hypothyroidism   . Rosacea    Dr. Lovell Sheehan  . Thyroid disease 2000   s/p XRT to thyroid, Dr. Sabra Heck at [place]   Past Surgical History:  Procedure Laterality Date  . APPENDECTOMY    . BREAST BIOPSY Right 2006   neg  . BREAST SURGERY    . CESAREAN SECTION     4, Dr. Buford Dresser  . HERNIA REPAIR     abdominal  . KNEE ARTHROSCOPY W/ MENISCAL REPAIR     2   Family History  Problem Relation Age of Onset  . Hyperlipidemia Mother   . Hypertension Mother   . Breast cancer Mother 4  . Colon polyps Mother   . Hypertension Father   . Heart disease Father   . Hypertension Brother   . Colon cancer Neg Hx   . Ovarian cancer Neg Hx     Allergies: Atorvastatin Current Outpatient Prescriptions on File Prior to Visit  Medication Sig Dispense Refill  . aspirin 81 MG tablet Take 1 tablet (81 mg total) by mouth daily. 30 tablet 6  . BD PEN NEEDLE NANO U/F 32G X 4 MM MISC U UTD 100 each 3  . BIOTIN PO Take by mouth.    . Blood Glucose Monitoring Suppl (ONE TOUCH ULTRA SYSTEM KIT) W/DEVICE KIT 1 kit by Does not apply route once. 1 each 0  . BYETTA 10 MCG PEN 10 MCG/0.04ML SOPN injection INJECT 10 MCG UNDER THE SKIN TWICE DAILY WITH A MEAL 2.4 mL 0  . BYETTA 10 MCG PEN 10 MCG/0.04ML SOPN injection INJECT 10 MCG UNDER  THE SKIN TWICE DAILY WITH A MEAL 2.4 mL 6  . Clindamycin Phosphate, 1 Dose, (CLINDESSE) vaginal cream Apply 1 Applicatorful topically 2 (two) times daily. 5.8 g 0  . Cyanocobalamin (B-12 PO) Take by mouth daily. Reported on 06/27/2015    . escitalopram (LEXAPRO) 10 MG tablet TAKE 1 TABLET(10 MG) BY MOUTH DAILY 30 tablet 0  . escitalopram (LEXAPRO) 10 MG tablet TAKE 1 TABLET(10 MG) BY MOUTH DAILY 30 tablet 6  . gentamicin ointment (GARAMYCIN) 0.1 % Apply 1 application topically 3 (three) times daily. 15 g 0  . lisinopril-hydrochlorothiazide (PRINZIDE,ZESTORETIC) 10-12.5 MG tablet TAKE 1 TABLET BY MOUTH DAILY 30 tablet 0  . lisinopril-hydrochlorothiazide (PRINZIDE,ZESTORETIC) 10-12.5 MG tablet TAKE 1 TABLET BY MOUTH DAILY 30 tablet 6  . metFORMIN (GLUCOPHAGE) 500 MG tablet TAKE 1 TABLET(500 MG) BY MOUTH TWICE DAILY WITH A MEAL 60 tablet 0  . metFORMIN (GLUCOPHAGE) 500 MG tablet TAKE 1 TABLET(500 MG) BY MOUTH TWICE DAILY WITH A MEAL 60 tablet 6  . Multiple Vitamin (MULTIVITAMIN) tablet Take 1 tablet by mouth daily.    . Omega-3 Fatty Acids (FISH OIL) 1200 MG CAPS Take 1,200 mg by  mouth daily. Reported on 06/14/2015    . pravastatin (PRAVACHOL) 40 MG tablet Take 1 tablet (40 mg total) by mouth daily. 90 tablet 2  . SYNTHROID 88 MCG tablet TAKE 1 TABLET BY MOUTH EVERY MORNING BEFORE BREAKFAST 30 tablet 0  . SYNTHROID 88 MCG tablet TAKE 1 TABLET BY MOUTH EVERY MORNING BEFORE BREAKFAST 30 tablet 6  . valACYclovir (VALTREX) 1000 MG tablet Take two tablets ( total 2000 mg) by mouth q12h x 1 day; Start: ASAP after symptom onset 6 tablet 2   Current Facility-Administered Medications on File Prior to Visit  Medication Dose Route Frequency Provider Last Rate Last Dose  . betamethasone acetate-betamethasone sodium phosphate (CELESTONE) injection 3 mg  3 mg Intramuscular Once Edrick Kins, DPM        Social History  Substance Use Topics  . Smoking status: Never Smoker  . Smokeless tobacco: Never Used  .  Alcohol use Yes     Comment: occasionally    Review of Systems  Constitutional: Negative for chills and fever.  Respiratory: Negative for cough.   Cardiovascular: Negative for chest pain and palpitations.  Gastrointestinal: Negative for nausea and vomiting.      Objective:    BP 120/68   Pulse 62   Temp 98.3 F (36.8 C) (Oral)   Ht _0  (1.6 m)   Wt 153 lb 3.2 oz (69.5 kg)   SpO2 98%   BMI 27.14 kg/m  BP Readings from Last 3 Encounters:  07/25/16 120/68  06/14/16 102/67  04/26/16 116/69   Wt Readings from Last 3 Encounters:  07/25/16 153 lb 3.2 oz (69.5 kg)  06/14/16 150 lb 11.2 oz (68.4 kg)  04/18/16 148 lb 3.2 oz (67.2 kg)    Physical Exam  Constitutional: She appears well-developed and well-nourished.  Eyes: Conjunctivae are normal.  Cardiovascular: Normal rate, regular rhythm, normal heart sounds and normal pulses.   Pulmonary/Chest: Effort normal and breath sounds normal. She has no wheezes. She has no rhonchi. She has no rales.  Neurological: She is alert.  Skin: Skin is warm and dry.  Psychiatric: She has a normal mood and affect. Her speech is normal and behavior is normal. Thought content normal.  Vitals reviewed.      Assessment & Plan:   Problem List Items Addressed This Visit      Cardiovascular and Mediastinum   Essential hypertension, benign - Primary (Chronic)    Well-controlled. Continue current regimen.      Relevant Orders   Comprehensive metabolic panel     Endocrine   Diabetes type 2, controlled ([place]) (Chronic)    Well-controlled. Continue current regimen      Relevant Orders   Hemoglobin A1c     Other   HLD (hyperlipidemia)    Tolerating statin 1-2 per week. Will slowly try to increase.           I am having Ms. Holben maintain her multivitamin, Fish Oil, ONE TOUCH ULTRA SYSTEM KIT, BIOTIN PO, Cyanocobalamin (B-12 PO), gentamicin ointment, aspirin, valACYclovir, pravastatin, lisinopril-hydrochlorothiazide, SYNTHROID,  metFORMIN, escitalopram, BYETTA 10 MCG PEN, Clindamycin Phosphate (1 Dose), BYETTA 10 MCG PEN, escitalopram, lisinopril-hydrochlorothiazide, metFORMIN, SYNTHROID, and BD PEN NEEDLE NANO U/F. We will continue to administer betamethasone acetate-betamethasone sodium phosphate.   No orders of the defined types were placed in this encounter.   Return precautions given.   Risks, benefits, and alternatives of the medications and treatment plan prescribed today were discussed, and patient expressed understanding.   Education regarding symptom management  and diagnosis given to patient on AVS.  Continue to follow with Mable Paris, FNP for routine health maintenance.   Ky Barban and I agreed with plan.   Mable Paris, FNP

## 2016-07-25 NOTE — Assessment & Plan Note (Signed)
Tolerating statin 1-2 per week. Will slowly try to increase.

## 2016-07-25 NOTE — Patient Instructions (Signed)
Pleasure seeing you  As long as blood pressure stays well controlled, happy to see you in 6 months  Labs today

## 2016-08-01 ENCOUNTER — Encounter: Payer: Self-pay | Admitting: Family

## 2016-08-29 LAB — HM DIABETES EYE EXAM

## 2016-09-06 ENCOUNTER — Encounter: Payer: Self-pay | Admitting: Family

## 2017-01-24 ENCOUNTER — Encounter: Payer: Self-pay | Admitting: Family

## 2017-01-24 ENCOUNTER — Ambulatory Visit (INDEPENDENT_AMBULATORY_CARE_PROVIDER_SITE_OTHER): Payer: Managed Care, Other (non HMO) | Admitting: Family

## 2017-01-24 VITALS — BP 112/66 | HR 66 | Temp 98.7°F | Ht 63.0 in | Wt 154.2 lb

## 2017-01-24 DIAGNOSIS — B001 Herpesviral vesicular dermatitis: Secondary | ICD-10-CM | POA: Diagnosis not present

## 2017-01-24 DIAGNOSIS — E785 Hyperlipidemia, unspecified: Secondary | ICD-10-CM

## 2017-01-24 DIAGNOSIS — I10 Essential (primary) hypertension: Secondary | ICD-10-CM | POA: Diagnosis not present

## 2017-01-24 DIAGNOSIS — Z23 Encounter for immunization: Secondary | ICD-10-CM | POA: Diagnosis not present

## 2017-01-24 DIAGNOSIS — E119 Type 2 diabetes mellitus without complications: Secondary | ICD-10-CM

## 2017-01-24 DIAGNOSIS — E039 Hypothyroidism, unspecified: Secondary | ICD-10-CM

## 2017-01-24 LAB — TSH: TSH: 0.05 u[IU]/mL — AB (ref 0.35–4.50)

## 2017-01-24 LAB — COMPREHENSIVE METABOLIC PANEL
ALK PHOS: 62 U/L (ref 39–117)
ALT: 15 U/L (ref 0–35)
AST: 16 U/L (ref 0–37)
Albumin: 4.4 g/dL (ref 3.5–5.2)
BUN: 16 mg/dL (ref 6–23)
CALCIUM: 9.5 mg/dL (ref 8.4–10.5)
CO2: 33 mEq/L — ABNORMAL HIGH (ref 19–32)
Chloride: 102 mEq/L (ref 96–112)
Creatinine, Ser: 0.84 mg/dL (ref 0.40–1.20)
GFR: 73.96 mL/min (ref >60.00)
GLUCOSE: 103 mg/dL — AB (ref 70–99)
POTASSIUM: 4.2 meq/L (ref 3.5–5.1)
Sodium: 140 mEq/L (ref 135–145)
TOTAL PROTEIN: 7.6 g/dL (ref 6.0–8.3)
Total Bilirubin: 0.7 mg/dL (ref 0.2–1.2)

## 2017-01-24 LAB — HEMOGLOBIN A1C: HEMOGLOBIN A1C: 5.6 % (ref 4.6–6.5)

## 2017-01-24 MED ORDER — METFORMIN HCL 500 MG PO TABS: 500.0000 mg | ORAL_TABLET | Freq: Every day | ORAL | 1 refills | Status: DC

## 2017-01-24 MED ORDER — ESCITALOPRAM OXALATE 10 MG PO TABS: ORAL_TABLET | ORAL | 1 refills | Status: DC

## 2017-01-24 MED ORDER — PRAVASTATIN SODIUM 40 MG PO TABS: 40.0000 mg | ORAL_TABLET | Freq: Every day | ORAL | 2 refills | Status: DC

## 2017-01-24 MED ORDER — BD PEN NEEDLE NANO U/F 32G X 4 MM MISC: 3 refills | Status: DC

## 2017-01-24 MED ORDER — VALACYCLOVIR HCL 1 G PO TABS: ORAL_TABLET | ORAL | 2 refills | Status: DC

## 2017-01-24 MED ORDER — GENTAMICIN SULFATE 0.1 % EX OINT: 1.0000 "application " | TOPICAL_OINTMENT | Freq: Three times a day (TID) | CUTANEOUS | 0 refills | Status: DC

## 2017-01-24 MED ORDER — LISINOPRIL-HYDROCHLOROTHIAZIDE 10-12.5 MG PO TABS: 1.0000 | ORAL_TABLET | Freq: Every day | ORAL | 0 refills | Status: DC

## 2017-01-24 NOTE — Progress Notes (Signed)
Subjective:    Patient ID: Lindsay Mejia, female    DOB: 11-06-1960, 56 y.o.   MRN: [id number]  CC: [name] is a 56 y.o. female who presents today for follow up.   HPI: Feeling well   No complaints today  HTN complaint medications.Denies exertional chest pain or pressure, numbness or tingling radiating to left arm or jaw, palpitations, dizziness, frequent headaches, changes in vision, or shortness of breath.    DM- compliant with medications.  Depression- doing well on Lexapro. No thoughts of hurting herself or anyone else.  Hypothyroidism- compliant medication. NO cold /heat intolerance  Remains frustrated by weight.  Needs refill of valtrex    HISTORY:  Past Medical History:  Diagnosis Date  . Anxiety   . Diabetes mellitus without complication ([place])   . Hyperlipidemia   . Hypertension   . Hypothyroidism   . Rosacea    Dr. Lovell Sheehan  . Thyroid disease 2000   s/p XRT to thyroid, Dr. Sabra Heck at [facility]   Past Surgical History:  Procedure Laterality Date  . APPENDECTOMY    . BREAST BIOPSY Right 2006   neg  . BREAST SURGERY    . CESAREAN SECTION     4, Dr. Buford Dresser  . HERNIA REPAIR     abdominal  . KNEE ARTHROSCOPY W/ MENISCAL REPAIR     2   Family History  Problem Relation Age of Onset  . Hyperlipidemia Mother   . Hypertension Mother   . Breast cancer Mother 78  . Colon polyps Mother   . Hypertension Father   . Heart disease Father   . Hypertension Brother   . Colon cancer Neg Hx   . Ovarian cancer Neg Hx     Allergies: Atorvastatin Current Outpatient Prescriptions on File Prior to Visit  Medication Sig Dispense Refill  . aspirin 81 MG tablet Take 1 tablet (81 mg total) by mouth daily. 30 tablet 6  . BIOTIN PO Take by mouth.    . Blood Glucose Monitoring Suppl (ONE TOUCH ULTRA SYSTEM KIT) W/DEVICE KIT 1 kit by Does not apply route once. 1 each 0  . BYETTA 10 MCG PEN 10 MCG/0.04ML SOPN injection INJECT 10 MCG UNDER THE SKIN TWICE DAILY  WITH A MEAL 2.4 mL 0  . BYETTA 10 MCG PEN 10 MCG/0.04ML SOPN injection INJECT 10 MCG UNDER THE SKIN TWICE DAILY WITH A MEAL 2.4 mL 6  . Clindamycin Phosphate, 1 Dose, (CLINDESSE) vaginal cream Apply 1 Applicatorful topically 2 (two) times daily. 5.8 g 0  . Cyanocobalamin (B-12 PO) Take by mouth daily. Reported on 06/27/2015    . escitalopram (LEXAPRO) 10 MG tablet TAKE 1 TABLET(10 MG) BY MOUTH DAILY 30 tablet 6  . lisinopril-hydrochlorothiazide (PRINZIDE,ZESTORETIC) 10-12.5 MG tablet TAKE 1 TABLET BY MOUTH DAILY 30 tablet 6  . metFORMIN (GLUCOPHAGE) 500 MG tablet TAKE 1 TABLET(500 MG) BY MOUTH TWICE DAILY WITH A MEAL 60 tablet 6  . Multiple Vitamin (MULTIVITAMIN) tablet Take 1 tablet by mouth daily.    . Omega-3 Fatty Acids (FISH OIL) 1200 MG CAPS Take 1,200 mg by mouth daily. Reported on 06/14/2015    . SYNTHROID 88 MCG tablet TAKE 1 TABLET BY MOUTH EVERY MORNING BEFORE BREAKFAST 30 tablet 0  . SYNTHROID 88 MCG tablet TAKE 1 TABLET BY MOUTH EVERY MORNING BEFORE BREAKFAST 30 tablet 6   Current Facility-Administered Medications on File Prior to Visit  Medication Dose Route Frequency Provider Last Rate Last Dose  . betamethasone acetate-betamethasone sodium phosphate (  CELESTONE) injection 3 mg  3 mg Intramuscular Once Edrick Kins, DPM        Social History  Substance Use Topics  . Smoking status: Never Smoker  . Smokeless tobacco: Never Used  . Alcohol use Yes     Comment: occasionally    Review of Systems  Constitutional: Negative for chills and fever.  Eyes: Negative for visual disturbance.  Respiratory: Negative for cough.   Cardiovascular: Negative for chest pain and palpitations.  Gastrointestinal: Negative for nausea and vomiting.  Endocrine: Negative for cold intolerance and heat intolerance.  Neurological: Negative for headaches.      Objective:    BP 112/66   Pulse 66   Temp 98.7 F (37.1 C) (Oral)   Ht 5' 3"  (1.6 m)   Wt 154 lb 3.2 oz (69.9 kg)   SpO2 95%   BMI  27.32 kg/m  BP Readings from Last 3 Encounters:  01/24/17 112/66  07/25/16 120/68  06/14/16 102/67   Wt Readings from Last 3 Encounters:  01/24/17 154 lb 3.2 oz (69.9 kg)  07/25/16 153 lb 3.2 oz (69.5 kg)  06/14/16 150 lb 11.2 oz (68.4 kg)    Physical Exam  Constitutional: She appears well-developed and well-nourished.  Eyes: Conjunctivae are normal.  Cardiovascular: Normal rate, regular rhythm, normal heart sounds and normal pulses.   Pulmonary/Chest: Effort normal and breath sounds normal. She has no wheezes. She has no rhonchi. She has no rales.  Neurological: She is alert.  Skin: Skin is warm and dry.  Psychiatric: She has a normal mood and affect. Her speech is normal and behavior is normal. Thought content normal.  Vitals reviewed.      Assessment & Plan:   Problem List Items Addressed This Visit      Cardiovascular and Mediastinum   Essential hypertension, benign - Primary (Chronic)    Stable. Will continue current regimen      Relevant Medications   lisinopril-hydrochlorothiazide (PRINZIDE,ZESTORETIC) 10-12.5 MG tablet   pravastatin (PRAVACHOL) 40 MG tablet   Other Relevant Orders   Comprehensive metabolic panel (Completed)     Digestive   Herpes labialis   Relevant Medications   gentamicin ointment (GARAMYCIN) 0.1 %   valACYclovir (VALTREX) 1000 MG tablet     Endocrine   Diabetes type 2, controlled (HCC) (Chronic)    Stable. Will continue current regimen      Relevant Medications   lisinopril-hydrochlorothiazide (PRINZIDE,ZESTORETIC) 10-12.5 MG tablet   metFORMIN (GLUCOPHAGE) 500 MG tablet   pravastatin (PRAVACHOL) 40 MG tablet   Other Relevant Orders   Hemoglobin A1c (Completed)   Hypothyroidism    clinically asymptomatic. Pending TSH      Relevant Orders   TSH (Completed)     Other   HLD (hyperlipidemia)   Relevant Medications   lisinopril-hydrochlorothiazide (PRINZIDE,ZESTORETIC) 10-12.5 MG tablet   pravastatin (PRAVACHOL) 40 MG tablet      Other Visit Diagnoses    Need for immunization against influenza       Relevant Orders   Flu Vaccine QUAD 36+ mos IM (Completed)       I have changed Ms. Hajjar's metFORMIN. I am also having her maintain her multivitamin, Fish Oil, ONE TOUCH ULTRA SYSTEM KIT, BIOTIN PO, Cyanocobalamin (B-12 PO), aspirin, SYNTHROID, BYETTA 10 MCG PEN, Clindamycin Phosphate (1 Dose), BYETTA 10 MCG PEN, escitalopram, lisinopril-hydrochlorothiazide, metFORMIN, SYNTHROID, BD PEN NEEDLE NANO U/F, escitalopram, gentamicin ointment, lisinopril-hydrochlorothiazide, pravastatin, and valACYclovir. We will continue to administer betamethasone acetate-betamethasone sodium phosphate.   Meds ordered this  encounter  Medications  . BD PEN NEEDLE NANO U/F 32G X 4 MM MISC    Sig: U UTD    Dispense:  100 each    Refill:  3  . escitalopram (LEXAPRO) 10 MG tablet    Sig: TAKE 1 TABLET(10 MG) BY MOUTH DAILY    Dispense:  90 tablet    Refill:  1  . gentamicin ointment (GARAMYCIN) 0.1 %    Sig: Apply 1 application topically 3 (three) times daily.    Dispense:  15 g    Refill:  0  . lisinopril-hydrochlorothiazide (PRINZIDE,ZESTORETIC) 10-12.5 MG tablet    Sig: Take 1 tablet by mouth daily.    Dispense:  30 tablet    Refill:  0  . metFORMIN (GLUCOPHAGE) 500 MG tablet    Sig: Take 1 tablet (500 mg total) by mouth daily with breakfast.    Dispense:  180 tablet    Refill:  1  . pravastatin (PRAVACHOL) 40 MG tablet    Sig: Take 1 tablet (40 mg total) by mouth daily.    Dispense:  90 tablet    Refill:  2  . valACYclovir (VALTREX) 1000 MG tablet    Sig: Take two tablets ( total 2000 mg) by mouth q12h x 1 day; Start: ASAP after symptom onset    Dispense:  6 tablet    Refill:  2    Return precautions given.   Risks, benefits, and alternatives of the medications and treatment plan prescribed today were discussed, and patient expressed understanding.   Education regarding symptom management and diagnosis given to  patient on AVS.  Continue to follow with Burnard Hawthorne, FNP for routine health maintenance.   Ky Barban and I agreed with plan.   Mable Paris, FNP

## 2017-01-24 NOTE — Patient Instructions (Signed)
Labs today  This is  Dr. Lupita Dawn  example of a  "Low GI"  Diet:  It will allow you to lose 4 to 8  lbs  per month if you follow it carefully.  Your goal with exercise is a minimum of 30 minutes of aerobic exercise 5 days per week (Walking does not count once it becomes easy!)    All of the foods can be found at grocery stores and in bulk at [employer].  The Atkins protein bars and shakes are available in more varieties at [employer], [employer] and [employer].     7 AM Breakfast:  Choose from the following:  Low carbohydrate Protein  Shakes (I recommend the  Premier Protein chocolate shakes,  EAS AdvantEdge "Carb Control" shakes  Or the Atkins shakes all are under 3 net carbs)     a scrambled egg/bacon/cheese burrito made with Mission's "carb balance" whole wheat tortilla  (about 10 net carbs )  Regulatory affairs officer (basically a quiche without the pastry crust) that is eaten cold and very convenient way to get your eggs.  8 carbs)  If you make your own protein shakes, avoid bananas and pineapple,  And use low carb greek yogurt or original /unsweetened almond or soy milk    Avoid cereal and bananas, oatmeal and cream of wheat and grits. They are loaded with carbohydrates!   10 AM: high protein snack:  Protein bar by Atkins (the snack size, under 200 cal, usually < 6 net carbs).    A stick of cheese:  Around 1 carb,  100 cal     Dannon Light n Fit [place] Yogurt  (80 cal, 8 carbs)  Other so called "protein bars" and Greek yogurts tend to be loaded with carbohydrates.  Remember, in food advertising, the word "energy" is synonymous for " carbohydrate."  Lunch:   A Sandwich using the bread choices listed, Can use any  Eggs,  lunchmeat, grilled meat or canned tuna), avocado, regular mayo/mustard  and cheese.  A Salad using blue cheese, ranch,  Goddess or vinagrette,  Avoid taco shells, croutons or "confetti" and no "candied nuts" but regular nuts OK.   No pretzels, nabs  or chips.   Pickles and miniature sweet peppers are a good low carb alternative that provide a "crunch"  The bread is the only source of carbohydrate in a sandwich and  can be decreased by trying some of the attached alternatives to traditional loaf bread   Avoid "Low fat dressings, as well as [place] dressings They are loaded with sugar!   3 PM/ Mid day  Snack:  Consider  1 ounce of  almonds, walnuts, pistachios, pecans, peanuts,  Macadamia nuts or a nut medley.  Avoid "granola and granola bars "  Mixed nuts are ok in moderation as long as there are no raisins,  cranberries or dried fruit.   KIND bars are OK if you get the low glycemic index variety   Try the prosciutto/mozzarella cheese sticks by [employer]  In deli /backery section   High protein      6 PM  Dinner:     Meat/fowl/fish with a green salad, and either broccoli, cauliflower, green beans, spinach, brussel sprouts or  Lima beans. DO NOT BREAD THE PROTEIN!!      There is a low carb pasta by Dreamfield's that is acceptable and tastes great: only 5 digestible carbs/serving.( All grocery stores but BJs carry it ) Several ready  made meals are available low carb:   Try Michel Angelo's chicken piccata or chicken or eggplant parm over low carb pasta.(Lowes and BJs)   Marjory Lies Sanchez's "Carnitas" (pulled pork, no sauce,  0 carbs) or his beef pot roast to make a dinner burrito (at [employer]J's)  Pesto over low carb pasta (bj's sells a good quality pesto in the center refrigerated section of the deli   Try satueeing  Cheral Marker with mushroooms as a good side   [employer] Giant makes a mashed cauliflower that tastes like mashed potatoes  Whole wheat pasta is still full of digestible carbs and  Not as low in glycemic index as Dreamfield's.   Brown rice is still rice,  So skip the rice and noodles if you eat [place] or [place] (or at least limit to 1/2 cup)  9 PM snack :   Breyer's "low carb" fudgsicle or  ice cream bar (Carb Smart line), or  Weight  Watcher's ice cream bar , or another "no sugar added" ice cream;  a serving of fresh berries/cherries with whipped cream   Cheese or DANNON'S LlGHT N FIT GREEK YOGURT  8 ounces of [place] unsweetened almond/cococunut milk    Treat yourself to a parfait made with whipped cream blueberiies, walnuts and vanilla greek yogurt  Avoid bananas, pineapple, grapes  and watermelon on a regular basis because they are high in sugar.  THINK OF THEM AS DESSERT  Remember that snack Substitutions should be less than 10 NET carbs per serving and meals < 20 carbs. Remember to subtract fiber grams to get the "net carbs."  @TULLOBREADPACKAGE @

## 2017-01-24 NOTE — Progress Notes (Signed)
Pre visit review using our clinic review tool, if applicable. No additional management support is needed unless otherwise documented below in the visit note. 

## 2017-01-25 ENCOUNTER — Encounter: Payer: Self-pay | Admitting: Family

## 2017-01-25 NOTE — Assessment & Plan Note (Signed)
clinically asymptomatic. Pending TSH

## 2017-01-25 NOTE — Assessment & Plan Note (Signed)
Stable. Will continue current regimen

## 2017-01-27 ENCOUNTER — Other Ambulatory Visit: Payer: Self-pay | Admitting: Family

## 2017-01-29 ENCOUNTER — Other Ambulatory Visit: Payer: Self-pay | Admitting: Family

## 2017-01-29 ENCOUNTER — Encounter: Payer: Self-pay | Admitting: Family

## 2017-01-29 DIAGNOSIS — E039 Hypothyroidism, unspecified: Secondary | ICD-10-CM

## 2017-02-02 ENCOUNTER — Other Ambulatory Visit: Payer: Self-pay | Admitting: Family

## 2017-02-02 DIAGNOSIS — E785 Hyperlipidemia, unspecified: Secondary | ICD-10-CM

## 2017-02-26 ENCOUNTER — Other Ambulatory Visit: Payer: Self-pay | Admitting: Family

## 2017-02-27 ENCOUNTER — Other Ambulatory Visit: Payer: Self-pay | Admitting: Family

## 2017-02-27 ENCOUNTER — Other Ambulatory Visit (INDEPENDENT_AMBULATORY_CARE_PROVIDER_SITE_OTHER): Payer: Managed Care, Other (non HMO)

## 2017-02-27 ENCOUNTER — Encounter: Payer: Self-pay | Admitting: Family

## 2017-02-27 DIAGNOSIS — E039 Hypothyroidism, unspecified: Secondary | ICD-10-CM

## 2017-02-27 DIAGNOSIS — E119 Type 2 diabetes mellitus without complications: Secondary | ICD-10-CM

## 2017-02-27 DIAGNOSIS — E785 Hyperlipidemia, unspecified: Secondary | ICD-10-CM

## 2017-02-27 LAB — LIPID PANEL
CHOL/HDL RATIO: 3
Cholesterol: 141 mg/dL (ref 0–200)
HDL: 46.1 mg/dL (ref >39.00)
LDL CALC: 74 mg/dL (ref 0–99)
NONHDL: 94.94
TRIGLYCERIDES: 104 mg/dL (ref 0.0–149.0)
VLDL: 20.8 mg/dL (ref 0.0–40.0)

## 2017-02-27 LAB — T4, FREE: Free T4: 1.82 ng/dL — ABNORMAL HIGH (ref 0.60–1.60)

## 2017-02-27 LAB — T3, FREE: T3 FREE: 3.5 pg/mL (ref 2.3–4.2)

## 2017-02-27 LAB — TSH: TSH: 0.11 u[IU]/mL — ABNORMAL LOW (ref 0.35–4.50)

## 2017-02-27 MED ORDER — LEVOTHYROXINE SODIUM 75 MCG PO TABS: 75.0000 ug | ORAL_TABLET | Freq: Every day | ORAL | 3 refills | Status: DC

## 2017-02-27 MED ORDER — METFORMIN HCL 500 MG PO TABS: ORAL_TABLET | ORAL | 6 refills | Status: DC

## 2017-03-26 ENCOUNTER — Other Ambulatory Visit: Payer: Self-pay | Admitting: Internal Medicine

## 2017-03-30 ENCOUNTER — Other Ambulatory Visit: Payer: Self-pay | Admitting: Family

## 2017-04-23 ENCOUNTER — Other Ambulatory Visit: Payer: Self-pay | Admitting: Family

## 2017-05-06 ENCOUNTER — Encounter: Payer: Self-pay | Admitting: Family

## 2017-05-13 ENCOUNTER — Other Ambulatory Visit: Payer: Self-pay | Admitting: *Deleted

## 2017-05-13 DIAGNOSIS — Z1231 Encounter for screening mammogram for malignant neoplasm of breast: Secondary | ICD-10-CM

## 2017-05-18 ENCOUNTER — Other Ambulatory Visit: Payer: Self-pay | Admitting: Family

## 2017-05-21 ENCOUNTER — Other Ambulatory Visit: Payer: Self-pay | Admitting: Obstetrics and Gynecology

## 2017-05-21 DIAGNOSIS — Z1231 Encounter for screening mammogram for malignant neoplasm of breast: Secondary | ICD-10-CM

## 2017-06-16 ENCOUNTER — Other Ambulatory Visit: Payer: Self-pay | Admitting: Family

## 2017-06-20 ENCOUNTER — Encounter: Payer: Managed Care, Other (non HMO) | Admitting: Obstetrics and Gynecology

## 2017-07-09 ENCOUNTER — Other Ambulatory Visit: Payer: Self-pay | Admitting: Family

## 2017-07-16 ENCOUNTER — Other Ambulatory Visit: Payer: Self-pay | Admitting: Family

## 2017-07-25 ENCOUNTER — Encounter: Payer: Managed Care, Other (non HMO) | Admitting: Obstetrics and Gynecology

## 2017-07-25 ENCOUNTER — Ambulatory Visit: Payer: Managed Care, Other (non HMO) | Admitting: Family

## 2017-07-26 ENCOUNTER — Encounter: Payer: Self-pay | Admitting: Family

## 2017-07-26 ENCOUNTER — Ambulatory Visit: Payer: Managed Care, Other (non HMO) | Admitting: Family

## 2017-07-26 VITALS — BP 100/78 | HR 64 | Temp 98.5°F | Wt 147.0 lb

## 2017-07-26 DIAGNOSIS — F419 Anxiety disorder, unspecified: Secondary | ICD-10-CM | POA: Diagnosis not present

## 2017-07-26 DIAGNOSIS — I10 Essential (primary) hypertension: Secondary | ICD-10-CM

## 2017-07-26 DIAGNOSIS — E039 Hypothyroidism, unspecified: Secondary | ICD-10-CM

## 2017-07-26 DIAGNOSIS — F329 Major depressive disorder, single episode, unspecified: Secondary | ICD-10-CM

## 2017-07-26 DIAGNOSIS — E119 Type 2 diabetes mellitus without complications: Secondary | ICD-10-CM | POA: Diagnosis not present

## 2017-07-26 DIAGNOSIS — E782 Mixed hyperlipidemia: Secondary | ICD-10-CM

## 2017-07-26 DIAGNOSIS — F32A Depression, unspecified: Secondary | ICD-10-CM | POA: Insufficient documentation

## 2017-07-26 LAB — COMPREHENSIVE METABOLIC PANEL
ALT: 13 U/L (ref 0–35)
AST: 16 U/L (ref 0–37)
Albumin: 4.1 g/dL (ref 3.5–5.2)
Alkaline Phosphatase: 56 U/L (ref 39–117)
BILIRUBIN TOTAL: 0.6 mg/dL (ref 0.2–1.2)
BUN: 15 mg/dL (ref 6–23)
CO2: 33 meq/L — AB (ref 19–32)
CREATININE: 0.81 mg/dL (ref 0.40–1.20)
Calcium: 9.2 mg/dL (ref 8.4–10.5)
Chloride: 101 mEq/L (ref 96–112)
GFR: 77 mL/min (ref >60.00)
Glucose, Bld: 83 mg/dL (ref 70–99)
Potassium: 3.9 mEq/L (ref 3.5–5.1)
Sodium: 140 mEq/L (ref 135–145)
Total Protein: 7.2 g/dL (ref 6.0–8.3)

## 2017-07-26 LAB — LIPID PANEL
CHOL/HDL RATIO: 3
Cholesterol: 138 mg/dL (ref 0–200)
HDL: 45.6 mg/dL (ref >39.00)
LDL Cholesterol: 72 mg/dL (ref 0–99)
NonHDL: 92.13
Triglycerides: 103 mg/dL (ref 0.0–149.0)
VLDL: 20.6 mg/dL (ref 0.0–40.0)

## 2017-07-26 LAB — HEMOGLOBIN A1C: Hgb A1c MFr Bld: 5.6 % (ref 4.6–6.5)

## 2017-07-26 LAB — TSH: TSH: 0.53 u[IU]/mL (ref 0.35–4.50)

## 2017-07-26 MED ORDER — METFORMIN HCL 500 MG PO TABS: ORAL_TABLET | ORAL | 3 refills | Status: DC

## 2017-07-26 NOTE — Assessment & Plan Note (Signed)
Patient well-appearing today.  Had a long discussion with patient and advised her let me know how I can support her in the  loss of her husband 2 weeks ago.  At this time, patient and I jointly agreed to stay with Lexapro 10 mg.  If she continues to have trouble sleeping despite melatonin, Unisom otc prn, she will let me know and we will increase the Lexapro to 20 mg/day.  Will follow

## 2017-07-26 NOTE — Patient Instructions (Addendum)
Due for pap. Please contact Melody.  May try Unisom for sleep.   Let me know if sleep does not improve.

## 2017-07-26 NOTE — Assessment & Plan Note (Signed)
Pending A1c 

## 2017-07-26 NOTE — Assessment & Plan Note (Signed)
Well-controlled.  Continue current regimen. 

## 2017-07-26 NOTE — Progress Notes (Signed)
Subjective:    Patient ID: Lindsay Mejia, female    DOB: Nov 30, 1960, 57 y.o.   MRN: [id number]  CC: Lindsay Mejia is a 57 y.o. female who presents today for follow up.   HPI: DM- not checking sugars. Needs refill of metformin.   HTN- compliant with medications Denies exertional chest pain or pressure, numbness or tingling radiating to left arm or jaw, palpitations, dizziness, frequent headaches, changes in vision, or shortness of breath.   Husband passed from metastatic melanoma 2 weeks ago. Over feels like she is doing well. Started walking. Had added melatonin 84m as had some trouble staying asleep, alone in home. On lexapro 181m  No si/hi.       HISTORY:  Past Medical History:  Diagnosis Date  . Anxiety   . Diabetes mellitus without complication ([place]  . Hyperlipidemia   . Hypertension   . Hypothyroidism   . Rosacea    Dr. KlLovell Sheehan. Thyroid disease 2000   s/p XRT to thyroid, Dr. [place]Sabra Heckt [place] Past Surgical History:  Procedure Laterality Date  . APPENDECTOMY    . BREAST BIOPSY Right 2006   neg  . BREAST SURGERY    . CESAREAN SECTION     4, Dr. ElBuford Dresser. HERNIA REPAIR     abdominal  . KNEE ARTHROSCOPY W/ MENISCAL REPAIR     2   Family History  Problem Relation Age of Onset  . Hyperlipidemia Mother   . Hypertension Mother   . Breast cancer Mother 6546. Colon polyps Mother   . Hypertension Father   . Heart disease Father   . Hypertension Brother   . Colon cancer Neg Hx   . Ovarian cancer Neg Hx     Allergies: Atorvastatin Current Outpatient Medications on File Prior to Visit  Medication Sig Dispense Refill  . aspirin 81 MG tablet Take 1 tablet (81 mg total) by mouth daily. 30 tablet 6  . BD PEN NEEDLE NANO U/F 32G X 4 MM MISC U UTD 100 each 3  . BIOTIN PO Take by mouth.    . Blood Glucose Monitoring Suppl (ONE TOUCH ULTRA SYSTEM KIT) W/DEVICE KIT 1 kit by Does not apply route once. 1 each 0  . BYETTA 10 MCG PEN 10 MCG/0.04ML SOPN  injection INJECT 10 MCG UNDER THE SKIN TWICE DAILY WITH A MEAL 2.4 mL 0  . BYETTA 10 MCG PEN 10 MCG/0.04ML SOPN injection INJECT 10 MCG UNDER THE SKIN TWICE DAILY WITH A MEAL 2.4 mL 0  . Clindamycin Phosphate, 1 Dose, (CLINDESSE) vaginal cream Apply 1 Applicatorful topically 2 (two) times daily. 5.8 g 0  . Cyanocobalamin (B-12 PO) Take by mouth daily. Reported on 06/27/2015    . escitalopram (LEXAPRO) 10 MG tablet TAKE 1 TABLET(10 MG) BY MOUTH DAILY 30 tablet 6  . escitalopram (LEXAPRO) 10 MG tablet TAKE 1 TABLET(10 MG) BY MOUTH DAILY 90 tablet 0  . gentamicin ointment (GARAMYCIN) 0.1 % Apply 1 application topically 3 (three) times daily. 15 g 0  . levothyroxine (SYNTHROID, LEVOTHROID) 75 MCG tablet Take 1 tablet (75 mcg total) daily by mouth. 90 tablet 3  . lisinopril-hydrochlorothiazide (PRINZIDE,ZESTORETIC) 10-12.5 MG tablet TAKE 1 TABLET BY MOUTH DAILY 30 tablet 6  . lisinopril-hydrochlorothiazide (PRINZIDE,ZESTORETIC) 10-12.5 MG tablet TAKE 1 TABLET BY MOUTH DAILY 30 tablet 0  . Multiple Vitamin (MULTIVITAMIN) tablet Take 1 tablet by mouth daily.    . Omega-3 Fatty Acids (FISH OIL) 1200 MG CAPS  Take 1,200 mg by mouth daily. Reported on 06/14/2015    . pravastatin (PRAVACHOL) 40 MG tablet Take 1 tablet (40 mg total) by mouth daily. 90 tablet 2  . valACYclovir (VALTREX) 1000 MG tablet Take two tablets ( total 2000 mg) by mouth q12h x 1 day; Start: ASAP after symptom onset 6 tablet 2   Current Facility-Administered Medications on File Prior to Visit  Medication Dose Route Frequency Provider Last Rate Last Dose  . betamethasone acetate-betamethasone sodium phosphate (CELESTONE) injection 3 mg  3 mg Intramuscular Once Edrick Kins, DPM        Social History   Tobacco Use  . Smoking status: Never Smoker  . Smokeless tobacco: Never Used  Substance Use Topics  . Alcohol use: Yes    Comment: occasionally  . Drug use: No    Review of Systems  Constitutional: Negative for chills and fever.    Respiratory: Negative for cough.   Cardiovascular: Negative for chest pain and palpitations.  Gastrointestinal: Negative for nausea and vomiting.  Psychiatric/Behavioral: Positive for sleep disturbance.      Objective:    BP 100/78 (BP Location: Left Arm, Patient Position: Sitting, Cuff Size: Normal)   Pulse 64   Temp 98.5 F (36.9 C) (Oral)   Wt 147 lb (66.7 kg)   SpO2 98%   BMI 26.04 kg/m  BP Readings from Last 3 Encounters:  07/26/17 100/78  01/24/17 112/66  07/25/16 120/68   Wt Readings from Last 3 Encounters:  07/26/17 147 lb (66.7 kg)  01/24/17 154 lb 3.2 oz (69.9 kg)  07/25/16 153 lb 3.2 oz (69.5 kg)    Physical Exam  Constitutional: She appears well-developed and well-nourished.  Eyes: Conjunctivae are normal.  Cardiovascular: Normal rate, regular rhythm, normal heart sounds and normal pulses.  Pulmonary/Chest: Effort normal and breath sounds normal. She has no wheezes. She has no rhonchi. She has no rales.  Neurological: She is alert.  Skin: Skin is warm and dry.  Psychiatric: She has a normal mood and affect. Her speech is normal and behavior is normal. Thought content normal.  Vitals reviewed.      Assessment & Plan:   Problem List Items Addressed This Visit      Cardiovascular and Mediastinum   Essential hypertension, benign (Chronic)    Well-controlled.  Continue current regimen.      Relevant Orders   Comprehensive metabolic panel     Endocrine   Diabetes type 2, controlled ([place]) - Primary (Chronic)    Pending A1c.      Relevant Medications   metFORMIN (GLUCOPHAGE) 500 MG tablet   Other Relevant Orders   Hemoglobin A1c   Hypothyroidism   Relevant Orders   TSH     Other   HLD (hyperlipidemia)   Relevant Orders   Lipid panel   Anxiety and depression    Patient well-appearing today.  Had a long discussion with patient and advised her let me know how I can support her in the  loss of her husband 2 weeks ago.  At this time, patient and I  jointly agreed to stay with Lexapro 10 mg.  If she continues to have trouble sleeping despite melatonin, Unisom otc prn, she will let me know and we will increase the Lexapro to 20 mg/day.  Will follow          I am having Nefertari M. Lampi maintain her multivitamin, Fish Oil, ONE TOUCH ULTRA SYSTEM KIT, BIOTIN PO, Cyanocobalamin (B-12 PO), aspirin, BYETTA 10  MCG PEN, Clindamycin Phosphate (1 Dose), escitalopram, lisinopril-hydrochlorothiazide, BD PEN NEEDLE NANO U/F, gentamicin ointment, pravastatin, valACYclovir, levothyroxine, escitalopram, lisinopril-hydrochlorothiazide, BYETTA 10 MCG PEN, and metFORMIN. We will continue to administer betamethasone acetate-betamethasone sodium phosphate.   Meds ordered this encounter  Medications  . metFORMIN (GLUCOPHAGE) 500 MG tablet    Sig: TAKE 1 TABLET(500 MG) BY MOUTH TWICE DAILY WITH A MEAL    Dispense:  180 tablet    Refill:  3    Return precautions given.   Risks, benefits, and alternatives of the medications and treatment plan prescribed today were discussed, and patient expressed understanding.   Education regarding symptom management and diagnosis given to patient on AVS.  Continue to follow with Burnard Hawthorne, FNP for routine health maintenance.   Ky Barban and I agreed with plan.   Mable Paris, FNP

## 2017-07-29 ENCOUNTER — Ambulatory Visit
Admission: RE | Admit: 2017-07-29 | Discharge: 2017-07-29 | Disposition: A | Discharge status: Home / Self Care | Payer: Managed Care, Other (non HMO) | Source: Ambulatory Visit | Attending: Obstetrics and Gynecology | Admitting: Obstetrics and Gynecology

## 2017-07-29 DIAGNOSIS — Z1231 Encounter for screening mammogram for malignant neoplasm of breast: Secondary | ICD-10-CM | POA: Diagnosis not present

## 2017-08-16 ENCOUNTER — Other Ambulatory Visit: Payer: Self-pay | Admitting: Family

## 2017-09-05 ENCOUNTER — Other Ambulatory Visit: Payer: Self-pay | Admitting: Family

## 2017-09-12 ENCOUNTER — Other Ambulatory Visit: Payer: Self-pay | Admitting: Family

## 2017-10-11 ENCOUNTER — Ambulatory Visit: Payer: Managed Care, Other (non HMO) | Admitting: Internal Medicine

## 2017-10-11 ENCOUNTER — Encounter: Payer: Self-pay | Admitting: Internal Medicine

## 2017-10-11 VITALS — BP 126/70 | HR 66 | Temp 98.7°F | Ht 63.0 in | Wt 151.6 lb

## 2017-10-11 DIAGNOSIS — R350 Frequency of micturition: Secondary | ICD-10-CM

## 2017-10-11 DIAGNOSIS — N3001 Acute cystitis with hematuria: Secondary | ICD-10-CM

## 2017-10-11 LAB — POCT URINALYSIS DIPSTICK
Bilirubin, UA: NEGATIVE
Glucose, UA: NEGATIVE
Ketones, UA: NEGATIVE
Nitrite, UA: NEGATIVE
PH UA: 5 (ref 5.0–8.0)
Protein, UA: POSITIVE — AB
Spec Grav, UA: >=1.03 — AB (ref 1.010–1.025)
UROBILINOGEN UA: 0.2 U/dL

## 2017-10-11 MED ORDER — CIPROFLOXACIN HCL 500 MG PO TABS: 500.0000 mg | ORAL_TABLET | Freq: Two times a day (BID) | ORAL | 0 refills | Status: DC

## 2017-10-11 NOTE — Progress Notes (Signed)
Chief Complaint  Patient presents with  . Urinary Frequency   Increased urine freq x 2 days no meds tried disccomfort at end of stream and low back pain left sided  Tearful today just lost husband   Review of Systems  Genitourinary: Positive for dysuria and frequency.  Musculoskeletal: Positive for back pain.   Past Medical History:  Diagnosis Date  . Anxiety   . Diabetes mellitus without complication ([place])   . Hyperlipidemia   . Hypertension   . Hypothyroidism   . Rosacea    Dr. Lovell Sheehan  . Thyroid disease 2000   s/p XRT to thyroid, Dr. Sabra Heck at [place]   Past Surgical History:  Procedure Laterality Date  . APPENDECTOMY    . BREAST BIOPSY Right 2006   neg  . BREAST SURGERY    . CESAREAN SECTION     4, Dr. Buford Dresser  . HERNIA REPAIR     abdominal  . KNEE ARTHROSCOPY W/ MENISCAL REPAIR     2   Family History  Problem Relation Age of Onset  . Hyperlipidemia Mother   . Hypertension Mother   . Breast cancer Mother 68  . Colon polyps Mother   . Hypertension Father   . Heart disease Father   . Hypertension Brother   . Colon cancer Neg Hx   . Ovarian cancer Neg Hx    Social History   Socioeconomic History  . Marital status: Married    Spouse name: Not on file  . Number of children: Not on file  . Years of education: Not on file  . Highest education level: Not on file  Occupational History  . Not on file  Social Needs  . Financial resource strain: Not on file  . Food insecurity:    Worry: Not on file    Inability: Not on file  . Transportation needs:    Medical: Not on file    Non-medical: Not on file  Tobacco Use  . Smoking status: Never Smoker  . Smokeless tobacco: Never Used  Substance and Sexual Activity  . Alcohol use: Yes    Comment: occasionally  . Drug use: No  . Sexual activity: Yes    Birth control/protection: Surgical  Lifestyle  . Physical activity:    Days per week: Not on file    Minutes per session: Not on file  . Stress: Not on  file  Relationships  . Social connections:    Talks on phone: Not on file    Gets together: Not on file    Attends religious service: Not on file    Active member of club or organization: Not on file    Attends meetings of clubs or organizations: Not on file    Relationship status: Not on file  . Intimate partner violence:    Fear of current or ex partner: Not on file    Emotionally abused: Not on file    Physically abused: Not on file    Forced sexual activity: Not on file  Other Topics Concern  . Not on file  Social History Narrative   Lives in [place]. Has 4 children. Retired - Programme researcher, broadcasting/film/video [employer]      Current Meds  Medication Sig  . aspirin 81 MG tablet Take 1 tablet (81 mg total) by mouth daily.  . BD PEN NEEDLE NANO U/F 32G X 4 MM MISC USE AS DIRECTED  . BIOTIN PO Take by mouth.  . Blood Glucose Monitoring Suppl (ONE TOUCH ULTRA SYSTEM KIT)  W/DEVICE KIT 1 kit by Does not apply route once.  Marland Kitchen BYETTA 10 MCG PEN 10 MCG/0.04ML SOPN injection INJECT 10 MCG UNDER THE SKIN TWICE DAILY WITH A MEAL  . Clindamycin Phosphate, 1 Dose, (CLINDESSE) vaginal cream Apply 1 Applicatorful topically 2 (two) times daily.  . Cyanocobalamin (B-12 PO) Take by mouth daily. Reported on 06/27/2015  . escitalopram (LEXAPRO) 10 MG tablet TAKE 1 TABLET(10 MG) BY MOUTH DAILY  . gentamicin ointment (GARAMYCIN) 0.1 % Apply 1 application topically 3 (three) times daily.  Marland Kitchen levothyroxine (SYNTHROID, LEVOTHROID) 75 MCG tablet Take 1 tablet (75 mcg total) daily by mouth.  Marland Kitchen lisinopril-hydrochlorothiazide (PRINZIDE,ZESTORETIC) 10-12.5 MG tablet TAKE 1 TABLET BY MOUTH DAILY  . metFORMIN (GLUCOPHAGE) 500 MG tablet TAKE 1 TABLET(500 MG) BY MOUTH TWICE DAILY WITH A MEAL  . Multiple Vitamin (MULTIVITAMIN) tablet Take 1 tablet by mouth daily.  . Omega-3 Fatty Acids (FISH OIL) 1200 MG CAPS Take 1,200 mg by mouth daily. Reported on 06/14/2015  . pravastatin (PRAVACHOL) 40 MG tablet Take 1 tablet (40 mg total) by mouth daily.  .  valACYclovir (VALTREX) 1000 MG tablet Take two tablets ( total 2000 mg) by mouth q12h x 1 day; Start: ASAP after symptom onset  . [DISCONTINUED] BYETTA 10 MCG PEN 10 MCG/0.04ML SOPN injection INJECT 10 MCG UNDER THE SKIN TWICE DAILY WITH A MEAL  . [DISCONTINUED] escitalopram (LEXAPRO) 10 MG tablet TAKE 1 TABLET(10 MG) BY MOUTH DAILY  . [DISCONTINUED] lisinopril-hydrochlorothiazide (PRINZIDE,ZESTORETIC) 10-12.5 MG tablet TAKE 1 TABLET BY MOUTH DAILY   Current Facility-Administered Medications for the 10/11/17 encounter (Office Visit) with McLean-Scocuzza, Nino Glow, MD  Medication  . betamethasone acetate-betamethasone sodium phosphate (CELESTONE) injection 3 mg   Allergies  Allergen Reactions  . Atorvastatin     Urethral swelling   Recent Results (from the past 2160 hour(s))  Lipid panel     Status: None   Collection Time: 07/26/17  9:50 AM  Result Value Ref Range   Cholesterol 138 0 - 200 mg/dL    Comment: ATP III Classification       Desirable:  < 200 mg/dL               Borderline High:  200 - 239 mg/dL          High:  > = 240 mg/dL   Triglycerides 103.0 0.0 - 149.0 mg/dL    Comment: Normal:  <150 mg/dLBorderline High:  150 - 199 mg/dL   HDL 45.60 >39.00 mg/dL   VLDL 20.6 0.0 - 40.0 mg/dL   LDL Cholesterol 72 0 - 99 mg/dL   Total CHOL/HDL Ratio 3     Comment:                Men          Women1/2 Average Risk     3.4          3.3Average Risk          5.0          4.42X Average Risk          9.6          7.13X Average Risk          15.0          11.0                       NonHDL 92.13     Comment: NOTE:  Non-HDL goal should be 30 mg/dL higher than  patient's LDL goal (i.e. LDL goal of < 70 mg/dL, would have non-HDL goal of < 100 mg/dL)  Comprehensive metabolic panel     Status: Abnormal   Collection Time: 07/26/17  9:50 AM  Result Value Ref Range   Sodium 140 135 - 145 mEq/L   Potassium 3.9 3.5 - 5.1 mEq/L   Chloride 101 96 - 112 mEq/L   CO2 33 (H) 19 - 32 mEq/L   Glucose, Bld 83 70  - 99 mg/dL   BUN 15 6 - 23 mg/dL   Creatinine, Ser 0.81 0.40 - 1.20 mg/dL   Total Bilirubin 0.6 0.2 - 1.2 mg/dL   Alkaline Phosphatase 56 39 - 117 U/L   AST 16 0 - 37 U/L   ALT 13 0 - 35 U/L   Total Protein 7.2 6.0 - 8.3 g/dL   Albumin 4.1 3.5 - 5.2 g/dL   Calcium 9.2 8.4 - 10.5 mg/dL   GFR 77.00 >60.00 mL/min  TSH     Status: None   Collection Time: 07/26/17  9:50 AM  Result Value Ref Range   TSH 0.53 0.35 - 4.50 uIU/mL  Hemoglobin A1c     Status: None   Collection Time: 07/26/17  9:50 AM  Result Value Ref Range   Hgb A1c MFr Bld 5.6 4.6 - 6.5 %    Comment: Glycemic Control Guidelines for People with Diabetes:Non Diabetic:  <6%Goal of Therapy: <7%Additional Action Suggested:  >8%    Objective  Body mass index is 26.85 kg/m. Wt Readings from Last 3 Encounters:  10/11/17 151 lb 9.6 oz (68.8 kg)  07/26/17 147 lb (66.7 kg)  01/24/17 154 lb 3.2 oz (69.9 kg)   Temp Readings from Last 3 Encounters:  10/11/17 98.7 F (37.1 C) (Oral)  07/26/17 98.5 F (36.9 C) (Oral)  01/24/17 98.7 F (37.1 C) (Oral)   BP Readings from Last 3 Encounters:  10/11/17 126/70  07/26/17 100/78  01/24/17 112/66   Pulse Readings from Last 3 Encounters:  10/11/17 66  07/26/17 64  01/24/17 66    Physical Exam  Constitutional: She is oriented to person, place, and time. Vital signs are normal. She appears well-developed and well-nourished. She is cooperative.  HENT:  Head: Normocephalic and atraumatic.  Mouth/Throat: Oropharynx is clear and moist and mucous membranes are normal.  Eyes: Pupils are equal, round, and reactive to light. Conjunctivae are normal.  Cardiovascular: Normal rate, regular rhythm and normal heart sounds.  Pulmonary/Chest: Effort normal and breath sounds normal.  Abdominal: Soft. Bowel sounds are normal. There is no tenderness. There is CVA tenderness.  Neurological: She is alert and oriented to person, place, and time. Gait normal.  Skin: Skin is warm, dry and intact.   Psychiatric: She has a normal mood and affect. Her speech is normal and behavior is normal. Judgment and thought content normal. Cognition and memory are normal.  Nursing note and vitals reviewed.   Assessment   1. uti suspected  Plan   1. cipro  Dip with blood, leuks, and protein today  ua and culture today f/u in 1-2 weeks if not better    Provider: Dr. Olivia Mackie McLean-Scocuzza-Internal Medicine

## 2017-10-11 NOTE — Patient Instructions (Addendum)
cipro take 2x per day 5 days  Try AZO pyridium  Increase water intake  If symptoms continue f/u with OB/GYN or PCP in 1-2 weeks  Take care    Urinary Tract Infection, Adult A urinary tract infection (UTI) is an infection of any part of the urinary tract. The urinary tract includes the:  Kidneys.  Ureters.  Bladder.  Urethra.  These organs make, store, and get rid of pee (urine) in the body. Follow these instructions at home:  Take over-the-counter and prescription medicines only as told by your doctor.  If you were prescribed an antibiotic medicine, take it as told by your doctor. Do not stop taking the antibiotic even if you start to feel better.  Avoid the following drinks: ? Alcohol. ? Caffeine. ? Tea. ? Carbonated drinks.  Drink enough fluid to keep your pee clear or pale yellow.  Keep all follow-up visits as told by your doctor. This is important.  Make sure to: ? Empty your bladder often and completely. Do not to hold pee for long periods of time. ? Empty your bladder before and after sex. ? Wipe from front to back after a bowel movement if you are female. Use each tissue one time when you wipe. Contact a doctor if:  You have back pain.  You have a fever.  You feel sick to your stomach (nauseous).  You throw up (vomit).  Your symptoms do not get better after 3 days.  Your symptoms go away and then come back. Get help right away if:  You have very bad back pain.  You have very bad lower belly (abdominal) pain.  You are throwing up and cannot keep down any medicines or water. This information is not intended to replace advice given to you by your health care provider. Make sure you discuss any questions you have with your health care provider. Document Released: 09/19/2007 Document Revised: 09/08/2015 Document Reviewed: 02/21/2015 Elsevier Interactive Patient Education  2018 [employer]. Urinary Frequency, Adult Urinary frequency means urinating  more often than usual. People with urinary frequency urinate at least 8 times in 24 hours, even if they drink a normal amount of fluid. Although they urinate more often than normal, the total amount of urine produced in a day may be normal. Urinary frequency is also called pollakiuria. What are the causes? This condition may be caused by:  A urinary tract infection.  Obesity.  Bladder problems, such as bladder stones.  Caffeine or alcohol.  Eating food or drinking fluids that irritate the bladder. These include coffee, tea, soda, artificial sweeteners, citrus, tomato-based foods, and chocolate.  Certain medicines, such as medicines that help the body get rid of extra fluid (diuretics).  Muscle or nerve weakness.  Overactive bladder.  Chronic diabetes.  Interstitial cystitis.  In men, problems with the prostate, such as an enlarged prostate.  In women, pregnancy.  In some cases, the cause may not be known. What increases the risk? This condition is more likely to develop in:  Women who have gone through menopause.  Men with prostate problems.  People with a disease or injury that affects the nerves or spinal cord.  People who have or have had a condition that affects the brain, such as a stroke.  What are the signs or symptoms? Symptoms of this condition include:  Feeling an urgent need to urinate often. The stress and anxiety of needing to find a bathroom quickly can make this urge worse.  Urinating 8 or more  times in 24 hours.  Urinating as often as every 1 to 2 hours.  How is this diagnosed? This condition is diagnosed based on your symptoms, your medical history, and a physical exam. You may have tests, such as:  Blood tests.  Urine tests.  Imaging tests, such as X-rays or ultrasounds.  A bladder test.  A test of your neurological system. This is the body system that senses the need to urinate.  A test to check for problems in the urethra and bladder  called cystoscopy.  You may also be asked to keep a bladder diary. A bladder diary is a record of what you eat and drink, how often you urinate, and how much you urinate. You may need to see a health care provider who specializes in conditions of the urinary tract (urologist) or kidneys (nephrologist). How is this treated? Treatment for this condition depends on the cause. Sometimes the condition goes away on its own and treatment is not necessary. If treatment is needed, it may include:  Taking medicine.  Learning exercises that strengthen the muscles that help control urination.  Following a bladder training program. This may include: ? Learning to delay going to the bathroom. ? Double urinating (voiding). This helps if you are not completely emptying your bladder. ? Scheduled voiding.  Making diet changes, such as: ? Avoiding caffeine. ? Drinking fewer fluids, especially alcohol. ? Not drinking in the evening. ? Not having foods or drinks that may irritate the bladder. ? Eating foods that help prevent or ease constipation. Constipation can make this condition worse.  Having the nerves in your bladder stimulated. There are two options for stimulating the nerves to your bladder: ? Outpatient electrical nerve stimulation. This is done by your health care provider. ? Surgery to implant a bladder pacemaker. The pacemaker helps to control the urge to urinate.  Follow these instructions at home:  Keep a bladder diary if told to by your health care provider.  Take over-the-counter and prescription medicines only as told by your health care provider.  Do any exercises as told by your health care provider.  Follow a bladder training program as told by your health care provider.  Make any recommended diet changes.  Keep all follow-up visits as told by your health care provider. This is important. Contact a health care provider if:  You start urinating more often.  You feel pain or  irritation when you urinate.  You notice blood in your urine.  Your urine looks cloudy.  You develop a fever.  You begin vomiting. Get help right away if:  You are unable to urinate. This information is not intended to replace advice given to you by your health care provider. Make sure you discuss any questions you have with your health care provider. Document Released: 01/27/2009 Document Revised: 05/04/2015 Document Reviewed: 10/27/2014 Elsevier Interactive Patient Education  [employer].

## 2017-10-11 NOTE — Progress Notes (Signed)
Pre visit review using our clinic review tool, if applicable. No additional management support is needed unless otherwise documented below in the visit note. 

## 2017-10-12 LAB — URINALYSIS, ROUTINE W REFLEX MICROSCOPIC
Bilirubin Urine: NEGATIVE
Glucose, UA: NEGATIVE
HYALINE CAST: NONE SEEN /LPF
Ketones, ur: NEGATIVE
Nitrite: NEGATIVE
Specific Gravity, Urine: 1.024 (ref 1.001–1.03)

## 2017-10-12 LAB — URINE CULTURE
MICRO NUMBER:: 90774909
SPECIMEN QUALITY: ADEQUATE

## 2017-10-13 ENCOUNTER — Other Ambulatory Visit: Payer: Self-pay | Admitting: Internal Medicine

## 2017-10-13 DIAGNOSIS — R319 Hematuria, unspecified: Secondary | ICD-10-CM

## 2017-11-01 ENCOUNTER — Encounter

## 2017-11-01 ENCOUNTER — Encounter: Payer: Managed Care, Other (non HMO) | Admitting: Obstetrics and Gynecology

## 2017-11-15 ENCOUNTER — Other Ambulatory Visit: Payer: Self-pay | Admitting: Family

## 2017-11-15 DIAGNOSIS — B001 Herpesviral vesicular dermatitis: Secondary | ICD-10-CM

## 2017-11-25 ENCOUNTER — Other Ambulatory Visit: Payer: Self-pay | Admitting: Family

## 2017-12-26 ENCOUNTER — Other Ambulatory Visit: Payer: Self-pay | Admitting: Family

## 2018-01-07 ENCOUNTER — Ambulatory Visit (INDEPENDENT_AMBULATORY_CARE_PROVIDER_SITE_OTHER): Payer: Managed Care, Other (non HMO) | Admitting: Obstetrics and Gynecology

## 2018-01-07 ENCOUNTER — Encounter: Payer: Self-pay | Admitting: Obstetrics and Gynecology

## 2018-01-07 ENCOUNTER — Encounter

## 2018-01-07 VITALS — BP 111/72 | HR 63 | Ht 63.0 in | Wt 150.6 lb

## 2018-01-07 DIAGNOSIS — Z01419 Encounter for gynecological examination (general) (routine) without abnormal findings: Secondary | ICD-10-CM

## 2018-01-07 NOTE — Progress Notes (Signed)
Subjective:   Lindsay Mejia is a 57 y.o. G47P4 Caucasian female here for a routine well-woman exam.  No LMP recorded. Patient has had a hysterectomy.    Current complaints: none PCP: Arnette       doesn't desire labs  Social History: Sexual: heterosexual Marital Status: widowed Living situation: alone Occupation: homemaker Tobacco/alcohol: no tobacco use Illicit drugs: no history of illicit drug use  The following portions of the patient's history were reviewed and updated as appropriate: allergies, current medications, past family history, past medical history, past social history, past surgical history and problem list.  Past Medical History Past Medical History:  Diagnosis Date  . Anxiety   . Diabetes mellitus without complication ([place])   . Hyperlipidemia   . Hypertension   . Hypothyroidism   . Rosacea    Dr. Lovell Sheehan  . Thyroid disease 2000   s/p XRT to thyroid, Dr. Sabra Heck at [facility]    Past Surgical History Past Surgical History:  Procedure Laterality Date  . APPENDECTOMY    . BREAST BIOPSY Right 2006   neg  . BREAST SURGERY    . CESAREAN SECTION     4, Dr. Buford Dresser  . HERNIA REPAIR     abdominal  . KNEE ARTHROSCOPY W/ MENISCAL REPAIR     2    Gynecologic History G4P4  No LMP recorded. Patient has had a hysterectomy. Contraception: status post hysterectomy Last Pap: 2016. Results were: normal Last mammogram: 07/2017. Results were: normal   Obstetric History OB History  Gravida Para Term Preterm AB Living  4 4          SAB TAB Ectopic Multiple Live Births               # Outcome Date GA Lbr Len/2nd Weight Sex Delivery Anes PTL Lv  4 Para      CS-Unspec     3 Para      CS-Unspec     2 Para      CS-Unspec     1 Para      CS-Unspec       Current Medications Current Outpatient Medications on File Prior to Visit  Medication Sig Dispense Refill  . aspirin 81 MG tablet Take 1 tablet (81 mg total) by mouth daily. 30 tablet 6  . BIOTIN PO Take by  mouth.    . Blood Glucose Monitoring Suppl (ONE TOUCH ULTRA SYSTEM KIT) W/DEVICE KIT 1 kit by Does not apply route once. 1 each 0  . BYETTA 10 MCG PEN 10 MCG/0.04ML SOPN injection INJECT 10 MCG UNDER THE SKIN TWICE DAILY WITH A MEAL 2.4 mL 1  . escitalopram (LEXAPRO) 10 MG tablet TAKE 1 TABLET(10 MG) BY MOUTH DAILY 90 tablet 1  . levothyroxine (SYNTHROID, LEVOTHROID) 75 MCG tablet Take 1 tablet (75 mcg total) daily by mouth. 90 tablet 3  . lisinopril-hydrochlorothiazide (PRINZIDE,ZESTORETIC) 10-12.5 MG tablet TAKE 1 TABLET BY MOUTH DAILY 30 tablet 1  . metFORMIN (GLUCOPHAGE) 500 MG tablet TAKE 1 TABLET(500 MG) BY MOUTH TWICE DAILY WITH A MEAL 180 tablet 3  . Multiple Vitamin (MULTIVITAMIN) tablet Take 1 tablet by mouth daily.    . Omega-3 Fatty Acids (FISH OIL) 1200 MG CAPS Take 1,200 mg by mouth daily. Reported on 06/14/2015    . valACYclovir (VALTREX) 1000 MG tablet TAKE 2 TABLETS(2000 MG) BY MOUTH EVERY 12 HOURS FOR 1 DAY. START: ASAP AFTER SYMPTOM ONSET 6 tablet 0  . BD PEN NEEDLE NANO U/F 32G X 4  MM MISC USE AS DIRECTED 100 each 6  . ciprofloxacin (CIPRO) 500 MG tablet Take 1 tablet (500 mg total) by mouth 2 (two) times daily. With food (Patient not taking: Reported on 01/07/2018) 10 tablet 0  . Clindamycin Phosphate, 1 Dose, (CLINDESSE) vaginal cream Apply 1 Applicatorful topically 2 (two) times daily. (Patient not taking: Reported on 01/07/2018) 5.8 g 0  . Cyanocobalamin (B-12 PO) Take by mouth daily. Reported on 06/27/2015    . pravastatin (PRAVACHOL) 40 MG tablet Take 1 tablet (40 mg total) by mouth daily. (Patient not taking: Reported on 01/07/2018) 90 tablet 2   Current Facility-Administered Medications on File Prior to Visit  Medication Dose Route Frequency Provider Last Rate Last Dose  . betamethasone acetate-betamethasone sodium phosphate (CELESTONE) injection 3 mg  3 mg Intramuscular Once Edrick Kins, DPM        Review of Systems Patient denies any headaches, blurred vision,  shortness of breath, chest pain, abdominal pain, problems with bowel movements, urination, or intercourse.  Objective:  BP 111/72   Pulse 63   Ht 5' 3"  (1.6 m)   Wt 150 lb 9.6 oz (68.3 kg)   BMI 26.68 kg/m  Physical Exam  General:  Well developed, well nourished, no acute distress. She is alert and oriented x3. Skin:  Warm and dry Neck:  Midline trachea, no thyromegaly or nodules Cardiovascular: Regular rate and rhythm, no murmur heard Lungs:  Effort normal, all lung fields clear to auscultation bilaterally Breasts:  No dominant palpable mass, retraction, or nipple discharge Abdomen:  Soft, non tender, no hepatosplenomegaly or masses Pelvic:  External genitalia is normal in appearance.  The vagina is normal in appearance. The cervix is bulbous, no CMT.  Thin prep pap is not done. Uterus is surgically absent  No adnexal masses or tenderness noted. Extremities:  No swelling or varicosities noted Psych:  She has a normal mood and affect  Assessment:   Healthy well-woman exam  Plan:  PCP does labs     F/U 1 year for AE, or sooner if needed Mammogram due next year  Melody Rockney [place], [place]

## 2018-01-09 ENCOUNTER — Encounter: Payer: Self-pay | Admitting: Family

## 2018-01-13 ENCOUNTER — Other Ambulatory Visit: Payer: Self-pay | Admitting: Family

## 2018-01-13 DIAGNOSIS — I10 Essential (primary) hypertension: Secondary | ICD-10-CM

## 2018-01-13 NOTE — Progress Notes (Signed)
close

## 2018-01-14 ENCOUNTER — Other Ambulatory Visit (INDEPENDENT_AMBULATORY_CARE_PROVIDER_SITE_OTHER): Payer: Managed Care, Other (non HMO)

## 2018-01-14 DIAGNOSIS — I10 Essential (primary) hypertension: Secondary | ICD-10-CM

## 2018-01-14 LAB — LIPID PANEL
CHOL/HDL RATIO: 4
Cholesterol: 195 mg/dL (ref 0–200)
HDL: 43.6 mg/dL (ref >39.00)
LDL Cholesterol: 118 mg/dL — ABNORMAL HIGH (ref 0–99)
NONHDL: 151.51
TRIGLYCERIDES: 170 mg/dL — AB (ref 0.0–149.0)
VLDL: 34 mg/dL (ref 0.0–40.0)

## 2018-01-14 LAB — COMPREHENSIVE METABOLIC PANEL
ALT: 13 U/L (ref 0–35)
AST: 18 U/L (ref 0–37)
Albumin: 4.2 g/dL (ref 3.5–5.2)
Alkaline Phosphatase: 59 U/L (ref 39–117)
BILIRUBIN TOTAL: 0.9 mg/dL (ref 0.2–1.2)
BUN: 14 mg/dL (ref 6–23)
CALCIUM: 9.6 mg/dL (ref 8.4–10.5)
CHLORIDE: 100 meq/L (ref 96–112)
CO2: 32 meq/L (ref 19–32)
CREATININE: 0.82 mg/dL (ref 0.40–1.20)
GFR: 75.79 mL/min (ref >60.00)
Glucose, Bld: 103 mg/dL — ABNORMAL HIGH (ref 70–99)
Potassium: 4.3 mEq/L (ref 3.5–5.1)
Sodium: 140 mEq/L (ref 135–145)
Total Protein: 7.4 g/dL (ref 6.0–8.3)

## 2018-01-14 LAB — HEMOGLOBIN A1C: Hgb A1c MFr Bld: 5.6 % (ref 4.6–6.5)

## 2018-01-15 ENCOUNTER — Other Ambulatory Visit: Payer: Self-pay | Admitting: Family

## 2018-01-20 ENCOUNTER — Other Ambulatory Visit: Payer: Self-pay | Admitting: Family

## 2018-01-20 DIAGNOSIS — E785 Hyperlipidemia, unspecified: Secondary | ICD-10-CM

## 2018-01-20 MED ORDER — ROSUVASTATIN CALCIUM 10 MG PO TABS: 10.0000 mg | ORAL_TABLET | Freq: Every day | ORAL | 3 refills | Status: DC

## 2018-01-21 ENCOUNTER — Other Ambulatory Visit: Payer: Self-pay | Admitting: Family

## 2018-01-21 DIAGNOSIS — B001 Herpesviral vesicular dermatitis: Secondary | ICD-10-CM

## 2018-03-02 ENCOUNTER — Other Ambulatory Visit: Payer: Self-pay | Admitting: Family

## 2018-03-02 DIAGNOSIS — E039 Hypothyroidism, unspecified: Secondary | ICD-10-CM

## 2018-03-29 ENCOUNTER — Other Ambulatory Visit: Payer: Self-pay | Admitting: Family

## 2018-03-31 ENCOUNTER — Encounter: Payer: Self-pay | Admitting: Family Medicine

## 2018-03-31 ENCOUNTER — Encounter: Payer: Self-pay | Admitting: Family

## 2018-03-31 ENCOUNTER — Ambulatory Visit: Payer: 59 | Admitting: Family Medicine

## 2018-03-31 VITALS — BP 108/60 | HR 73 | Temp 98.3°F | Ht 63.0 in | Wt 152.0 lb

## 2018-03-31 DIAGNOSIS — H1032 Unspecified acute conjunctivitis, left eye: Secondary | ICD-10-CM

## 2018-03-31 DIAGNOSIS — R0982 Postnasal drip: Secondary | ICD-10-CM | POA: Diagnosis not present

## 2018-03-31 DIAGNOSIS — J019 Acute sinusitis, unspecified: Secondary | ICD-10-CM

## 2018-03-31 DIAGNOSIS — R05 Cough: Secondary | ICD-10-CM

## 2018-03-31 DIAGNOSIS — R059 Cough, unspecified: Secondary | ICD-10-CM

## 2018-03-31 MED ORDER — POLYMYXIN B-TRIMETHOPRIM 10000-0.1 UNIT/ML-% OP SOLN: 1.0000 [drp] | OPHTHALMIC | 0 refills | Status: DC

## 2018-03-31 MED ORDER — BENZONATATE 100 MG PO CAPS: 100.0000 mg | ORAL_CAPSULE | Freq: Three times a day (TID) | ORAL | 0 refills | Status: DC | PRN

## 2018-03-31 MED ORDER — AMOXICILLIN-POT CLAVULANATE 875-125 MG PO TABS: 1.0000 | ORAL_TABLET | Freq: Two times a day (BID) | ORAL | 0 refills | Status: DC

## 2018-03-31 NOTE — Progress Notes (Signed)
Subjective:    Patient ID: Lindsay Mejia, female    DOB: 01/10/1961, 57 y.o.   MRN: [id number]  HPI  Presents to clinic c/o possible pink eye in left eye due to thick discharge and being matted shut this AM.  Patient has 4 young grandchildren, and is concerned 1 of them possibly gave her pinkeye.  Patient also has been having sinus congestion, thick nasal drainage, ear fullness and feeling rundown for past 2 and half weeks.  Patient's been trying to battle on her own with saline nasal washes, allergy medicine, getting plenty of rest but symptoms persist.  Denies fever or chills.  Denies nausea, vomiting or diarrhea.  Patient Active Problem List   Diagnosis Date Noted  . Anxiety and depression 07/26/2017  . HLD (hyperlipidemia) 07/25/2016  . Herpes labialis 04/18/2016  . Rosacea 09/16/2012  . Overweight (BMI 25.0-29.9) 08/06/2012  . Diabetes type 2, controlled ([place]) 06/26/2012  . Essential hypertension, benign 06/26/2012  . Hypothyroidism 09/03/2011   Social History   Tobacco Use  . Smoking status: Never Smoker  . Smokeless tobacco: Never Used  Substance Use Topics  . Alcohol use: Yes    Comment: occasionally   Review of Systems  Constitutional: Negative for chills, fatigue and fever.  HENT: +congestion, ear pain, sinus pain/drainage.   Eyes: Red eye, discharge - Left eye.   Respiratory: +cough. Negative for shortness of breath and wheezing.   Cardiovascular: Negative for chest pain, palpitations and leg swelling.  Gastrointestinal: Negative for abdominal pain, diarrhea, nausea and vomiting.  Genitourinary: Negative for dysuria, frequency and urgency.  Musculoskeletal: Negative for arthralgias and myalgias.  Skin: Negative for color change, pallor and rash.  Neurological: Negative for syncope, light-headedness and headaches.  Psychiatric/Behavioral: The patient is not nervous/anxious.       Objective:   Physical Exam Vitals signs and nursing note reviewed.    Constitutional:      General: She is not in acute distress.    Appearance: She is not toxic-appearing.  HENT:     Head: Normocephalic and atraumatic.     Nose: Nasal tenderness, mucosal edema and rhinorrhea present.     Right Sinus: Maxillary sinus tenderness and frontal sinus tenderness present.     Left Sinus: Frontal sinus tenderness present.     Comments: +PND. +fullness bilateral TMs    Mouth/Throat:     Mouth: Mucous membranes are moist.  Eyes:     General: Vision grossly intact. No scleral icterus.       Right eye: No foreign body.        Left eye: No foreign body.     Extraocular Movements: Extraocular movements intact.     Conjunctiva/sclera:     Right eye: Right conjunctiva is injected. Exudate present.     Left eye: Left conjunctiva is not injected. No exudate. Neck:     Musculoskeletal: Normal range of motion. No neck rigidity.  Cardiovascular:     Rate and Rhythm: Normal rate and regular rhythm.  Pulmonary:     Effort: Pulmonary effort is normal. No respiratory distress.     Breath sounds: Normal breath sounds.  Neurological:     Mental Status: She is alert.  Psychiatric:        Mood and Affect: Mood normal.        Behavior: Behavior normal.       Vitals:   03/31/18 1426  BP: 108/60  Pulse: 73  Temp: 98.3 F (36.8 C)  SpO2: 96%  Assessment & Plan:   Conjunctivitis left eye-patient will use Polytrim drops in the left eye to treat this.  Advised to dispose of any recently used eye make-up, do good handwashing, if having to wipe eye wipe from inner to outer corner and then dispose of towel that you used to 0.5.  Also advised to change pillowcase.  Sinusitis-patient will take Augmentin twice daily for 10 days.  Advised to rest, increase fluids and do good handwashing.  Also advised to do saline nasal flushes with a Nettie pot to help reduce congestion.  Postnasal drip/cough-lungs are clear on exam so I suspect cough is related to postnasal drip.  Patient  sent in [place] to use as needed to help calm cough and also advised if Tessalon Perles are ineffective she can try over-the-counter Mucinex or Delsym syrup.  Patient will keep regularly scheduled follow-up with PCP as planned.  Advised to return clinic sooner if issues arise.

## 2018-04-11 ENCOUNTER — Ambulatory Visit: Payer: 59 | Admitting: Family

## 2018-04-11 ENCOUNTER — Encounter: Payer: Self-pay | Admitting: Family

## 2018-04-11 VITALS — BP 122/78 | HR 65 | Temp 98.1°F | Ht 62.5 in | Wt 152.8 lb

## 2018-04-11 DIAGNOSIS — R0789 Other chest pain: Secondary | ICD-10-CM | POA: Diagnosis not present

## 2018-04-11 DIAGNOSIS — I10 Essential (primary) hypertension: Secondary | ICD-10-CM

## 2018-04-11 DIAGNOSIS — E785 Hyperlipidemia, unspecified: Secondary | ICD-10-CM | POA: Diagnosis not present

## 2018-04-11 DIAGNOSIS — R319 Hematuria, unspecified: Secondary | ICD-10-CM | POA: Diagnosis not present

## 2018-04-11 DIAGNOSIS — F329 Major depressive disorder, single episode, unspecified: Secondary | ICD-10-CM

## 2018-04-11 DIAGNOSIS — F419 Anxiety disorder, unspecified: Secondary | ICD-10-CM

## 2018-04-11 DIAGNOSIS — E039 Hypothyroidism, unspecified: Secondary | ICD-10-CM | POA: Diagnosis not present

## 2018-04-11 DIAGNOSIS — E119 Type 2 diabetes mellitus without complications: Secondary | ICD-10-CM | POA: Diagnosis not present

## 2018-04-11 LAB — LIPID PANEL
CHOL/HDL RATIO: 3
Cholesterol: 146 mg/dL (ref 0–200)
HDL: 48.7 mg/dL (ref >39.00)
LDL CALC: 72 mg/dL (ref 0–99)
NONHDL: 97.46
TRIGLYCERIDES: 128 mg/dL (ref 0.0–149.0)
VLDL: 25.6 mg/dL (ref 0.0–40.0)

## 2018-04-11 LAB — URINALYSIS, MICROSCOPIC ONLY
RBC / HPF: NONE SEEN (ref 0–?)
WBC, UA: NONE SEEN (ref 0–?)

## 2018-04-11 LAB — TSH: TSH: 0.9 u[IU]/mL (ref 0.35–4.50)

## 2018-04-11 NOTE — Assessment & Plan Note (Signed)
Continue regimen.  Not due for A1c.

## 2018-04-11 NOTE — Assessment & Plan Note (Addendum)
One episode which occurred in setting of URI it appears.  EKG not show any acute ischemia, no significant changes since prior EKG 2015.  Reviewed EKG with supervising, Dr Derrel Nip.   long discussion with patient regard to her risk for an MI including hypertension, hyperlipidemia, diabetes.  I strongly encouraged her to have a cardiac evaluation for further risk stratification, stress testing etc.  Patient verbalized understanding of this.  Education provided on risk and symptoms of MI.  Advised patient to remain hypervigilant.  Patient advised that if she were to have any episode in the future, she understands to go to emergency room for further evaluation.

## 2018-04-11 NOTE — Progress Notes (Signed)
Subjective:    Patient ID: Lindsay Mejia, female    DOB: 08/16/1960, 57 y.o.   MRN: [id number]  CC: Lindsay Mejia is a 57 y.o. female who presents today for follow up.   HPI: HTN- compliant with medication. Denies exertional chest pain or pressure, numbness or tingling radiating to left arm or jaw, palpitations, dizziness, frequent headaches, changes in vision, or shortness of breath.  Reports  One episode chest tightness with 'coughing spell' 2 weeks ago. Lasting for few minutes, resolved on it's own.  'wasn't sure if nervous' . Felt like had left arm tingling at time. No wheezing, sob,chest pain, vision changes, HA.   Never had stress test.   Does boot camp 2 days per week. No chest pain.   DM-compliant metformin.  HLD - compliant with crestor   Depression-feels well on Lexapro.  No thoughts of hurting herself or anyone else.  Occasionally has trouble sleeping however this is not often.  Declines any adjuncts or changes to current medication regimen.   widow - husband passed 6 months ago.  Following with GYN.      HISTORY:  Past Medical History:  Diagnosis Date  . Anxiety   . Diabetes mellitus without complication ([place])   . Hyperlipidemia   . Hypertension   . Hypothyroidism   . Rosacea    Dr. Lovell Sheehan  . Thyroid disease 2000   s/p XRT to thyroid, Dr. Sabra Heck at [place]   Past Surgical History:  Procedure Laterality Date  . ABDOMINAL HYSTERECTOMY     has one ovary   . APPENDECTOMY    . BREAST BIOPSY Right 2006   neg  . BREAST SURGERY    . CESAREAN SECTION     4, Dr. Buford Dresser  . HERNIA REPAIR     abdominal  . KNEE ARTHROSCOPY W/ MENISCAL REPAIR     2   Family History  Problem Relation Age of Onset  . Hyperlipidemia Mother   . Hypertension Mother   . Breast cancer Mother 59  . Colon polyps Mother   . Hypertension Father   . Heart disease Father   . Hypertension Brother   . Colon cancer Neg Hx   . Ovarian cancer Neg Hx     Allergies:  Atorvastatin Current Outpatient Medications on File Prior to Visit  Medication Sig Dispense Refill  . aspirin 81 MG tablet Take 1 tablet (81 mg total) by mouth daily. 30 tablet 6  . benzonatate (TESSALON PERLES) 100 MG capsule Take 1 capsule (100 mg total) by mouth 3 (three) times daily as needed for cough. 30 capsule 0  . BIOTIN PO Take by mouth.    . escitalopram (LEXAPRO) 10 MG tablet TAKE 1 TABLET(10 MG) BY MOUTH DAILY 90 tablet 1  . lisinopril-hydrochlorothiazide (PRINZIDE,ZESTORETIC) 10-12.5 MG tablet TAKE 1 TABLET BY MOUTH DAILY 90 tablet 1  . metFORMIN (GLUCOPHAGE) 500 MG tablet TAKE 1 TABLET(500 MG) BY MOUTH TWICE DAILY WITH A MEAL 180 tablet 3  . Multiple Vitamin (MULTIVITAMIN) tablet Take 1 tablet by mouth daily.    . Omega-3 Fatty Acids (FISH OIL) 1200 MG CAPS Take 1,200 mg by mouth daily. Reported on 06/14/2015    . rosuvastatin (CRESTOR) 10 MG tablet Take 1 tablet (10 mg total) by mouth daily. 90 tablet 3  . SYNTHROID 75 MCG tablet TAKE 1 TABLET(75 MCG) BY MOUTH DAILY 90 tablet 0  . trimethoprim-polymyxin b (POLYTRIM) ophthalmic solution Place 1 drop into the left eye every 4 (four) hours. 10  mL 0  . valACYclovir (VALTREX) 1000 MG tablet TAKE 2 TABLETS(2000 MG) BY MOUTH EVERY 12 HOURS FOR 1 DAY. START: ASAP AFTER SYMPTOM ONSET 6 tablet 0   Current Facility-Administered Medications on File Prior to Visit  Medication Dose Route Frequency Provider Last Rate Last Dose  . betamethasone acetate-betamethasone sodium phosphate (CELESTONE) injection 3 mg  3 mg Intramuscular Once Edrick Kins, DPM        Social History   Tobacco Use  . Smoking status: Never Smoker  . Smokeless tobacco: Never Used  Substance Use Topics  . Alcohol use: Yes    Comment: occasionally  . Drug use: No    Review of Systems  Constitutional: Negative for chills and fever.  Respiratory: Positive for chest tightness (resolved). Negative for cough (resolved), shortness of breath and wheezing.    Cardiovascular: Negative for chest pain, palpitations and leg swelling.  Gastrointestinal: Negative for nausea and vomiting.      Objective:    BP 122/78   Pulse 65   Temp 98.1 F (36.7 C) (Oral)   Ht 5' 2.5" (1.588 m)   Wt 152 lb 12.8 oz (69.3 kg)   SpO2 99%   BMI 27.50 kg/m  BP Readings from Last 3 Encounters:  04/11/18 122/78  03/31/18 108/60  01/07/18 111/72   Wt Readings from Last 3 Encounters:  04/11/18 152 lb 12.8 oz (69.3 kg)  03/31/18 152 lb (68.9 kg)  01/07/18 150 lb 9.6 oz (68.3 kg)    Physical Exam Vitals signs reviewed.  Constitutional:      Appearance: She is well-developed.  Eyes:     Conjunctiva/sclera: Conjunctivae normal.  Cardiovascular:     Rate and Rhythm: Normal rate and regular rhythm.     Pulses: Normal pulses.     Heart sounds: Normal heart sounds.  Pulmonary:     Effort: Pulmonary effort is normal.     Breath sounds: Normal breath sounds. No wheezing, rhonchi or rales.  Chest:     Chest wall: No tenderness.     Comments: Chest pain not reproducible on exam. Skin:    General: Skin is warm and dry.  Neurological:     Mental Status: She is alert.  Psychiatric:        Speech: Speech normal.        Behavior: Behavior normal.        Thought Content: Thought content normal.        Assessment & Plan:   Problem List Items Addressed This Visit      Cardiovascular and Mediastinum   Essential hypertension, benign (Chronic)    At goal, continue current regimen        Endocrine   Diabetes type 2, controlled ([place]) (Chronic)    Continue regimen.  Not due for A1c.      Hypothyroidism   Relevant Orders   TSH     Other   HLD (hyperlipidemia)    Continue regimen, pending lipid panel.      Relevant Orders   Lipid panel   Anxiety and depression   Chest tightness - Primary    One episode which occurred in setting of URI it appears.  EKG not show any acute ischemia, no significant changes since prior EKG 2015.  Reviewed EKG with  supervising, Dr Derrel Nip.   long discussion with patient regard to her risk for an MI including hypertension, hyperlipidemia, diabetes.  I strongly encouraged her to have a cardiac evaluation for further risk stratification, stress testing etc.  Patient verbalized understanding of this.  Education provided on risk and symptoms of MI.  Advised patient to remain hypervigilant.  Patient advised that if she were to have any episode in the future, she understands to go to emergency room for further evaluation.      Relevant Orders   EKG 12-Lead (Completed)   Ambulatory referral to Cardiology    Other Visit Diagnoses    Hematuria, unspecified type       Relevant Orders   Urine Microscopic       I have discontinued Elaf M. Bellizzi's ONE TOUCH ULTRA SYSTEM KIT, Cyanocobalamin (B-12 PO), Clindamycin Phosphate (1 Dose), BD PEN NEEDLE NANO U/F, ciprofloxacin, and amoxicillin-clavulanate. I am also having her maintain her multivitamin, Fish Oil, BIOTIN PO, aspirin, metFORMIN, escitalopram, rosuvastatin, valACYclovir, SYNTHROID, lisinopril-hydrochlorothiazide, trimethoprim-polymyxin b, and benzonatate. We will continue to administer betamethasone acetate-betamethasone sodium phosphate.   No orders of the defined types were placed in this encounter.   Return precautions given.   Risks, benefits, and alternatives of the medications and treatment plan prescribed today were discussed, and patient expressed understanding.   Education regarding symptom management and diagnosis given to patient on AVS.  Continue to follow with Burnard Hawthorne, FNP for routine health maintenance.   Ky Barban and I agreed with plan.   Mable Paris, FNP

## 2018-04-11 NOTE — Patient Instructions (Addendum)
Please stay hyper vigilant in regards to signs or symptoms to suggest cardiac etiology- this would include, chest tightness, shortness of breath, palpitations.  Please read the literature below.    Today we discussed referrals, orders. cardiology   I have placed these orders in the system for you.  Please be sure to give [place] a call if you have not heard from our office regarding this. We should hear from [place] within ONE week with information regarding your appointment. If not, please let me know immediately.    Heart Attack A heart attack (myocardial infarction, MI) is when blood and oxygen supply to the heart is cut off. A heart attack causes damage to the heart that cannot be fixed. If you think you are having a heart attack, do not wait to see if the symptoms will go away. Get medical help right away. What are the signs or symptoms? Symptoms of this condition include:  Chest pain. It may feel like: ? Crushing or squeezing. ? Tightness, pressure, fullness, or heaviness.  Pain in the arm, neck, jaw, back, or upper body.  Shortness of breath.  Heartburn.  Indigestion.  Nausea.  Cold sweats.  Feeling tired.  Sudden lightheadedness. Follow these instructions at home: Medicines  Take over-the-counter and prescription medicines only as told by your doctor. You may need to take medicine: ? To keep your blood from clotting too easily. ? To control blood pressure. ? To lower cholesterol. ? To control heart rhythms.  Do not take these medicines unless your doctor says it is okay: ? NSAIDs. ? Supplements that have vitamin A, vitamin E, or both. ? Hormone replacement therapy that has estrogen with or without progestin. Lifestyle   Do not use any products that have nicotine or tobacco. This includes cigarettes and e-cigarettes. If you need help quitting, ask your doctor.  Avoid secondhand smoke.  Exercise regularly. Ask your doctor about a cardiac rehab program.  Eat  heart-healthy foods. A diet and nutrition specialist (registered dietitian) can help you form healthy eating habits.  Stay at a healthy weight.  Lower your stress level.  Do not use illegal drugs. Alcohol use  Do not drink alcohol if: ? Your doctor tells you not to drink. ? You are pregnant, may be pregnant, or are planning to become pregnant.  If you drink alcohol, limit how much you have: ? 0-1 drink a day for women. ? 0-2 drinks a day for men.  Know how much alcohol is in your drink. In the [place] one drink equals one typical bottle of beer (12 oz), one-half glass of wine (5 oz), or one shot of hard liquor (1 oz). General instructions  Work with your doctor to treat other problems you have, like diabetes.  Get screened for depression, and get treatment if needed.  Keep your vaccinations up to date. Get the flu shot (influenza vaccination) every year.  Keep all follow-up visits as told by your doctor. This is important. Contact a doctor if:  You feel sad.  You have trouble doing your daily activities. Get help right away if you:  Have sudden, unexplained discomfort in your: ? Chest. ? Arms. ? Back. ? Neck. ? Jaw. ? Upper body.  Have shortness of breath.  Have sudden sweating or clammy skin.  Feel sick to your stomach (nauseous).  Throw up (vomit).  Suddenly get lightheaded or dizzy.  Feel your heart beating fast.  Feel your heart skipping beats.  Have blood pressure that is higher than  180/120. These symptoms may be an emergency. Do not wait to see if the symptoms will go away. Get medical help right away. Call your local emergency services (911 in the [place] Do not drive yourself to the hospital. Summary  A heart attack is when blood and oxygen supply to the heart is cut off.  You should not take NSAIDs unless your doctor says it is okay.  Do not smoke. Avoid secondhand smoke.  Exercise regularly. Ask your doctor about a cardiac rehab  program. This information is not intended to replace advice given to you by your health care provider. Make sure you discuss any questions you have with your health care provider. Document Released: 10/02/2011 Document Revised: 12/25/2017 Document Reviewed: 05/17/2017 Elsevier Interactive Patient Education  2019 [employer].

## 2018-04-11 NOTE — Assessment & Plan Note (Signed)
At goal, continue current regimen 

## 2018-04-11 NOTE — Assessment & Plan Note (Signed)
Continue regimen, pending lipid panel.

## 2018-05-07 ENCOUNTER — Other Ambulatory Visit: Payer: Self-pay | Admitting: Family

## 2018-05-07 DIAGNOSIS — B001 Herpesviral vesicular dermatitis: Secondary | ICD-10-CM

## 2018-05-24 ENCOUNTER — Other Ambulatory Visit: Payer: Self-pay | Admitting: Family

## 2018-05-24 DIAGNOSIS — E039 Hypothyroidism, unspecified: Secondary | ICD-10-CM

## 2018-05-26 ENCOUNTER — Telehealth: Payer: Self-pay | Admitting: Family

## 2018-05-26 NOTE — Telephone Encounter (Signed)
Call pt I rec'd note that she had not seen cardiology  Has decided not to be evaluated?  I would advise her to pursue

## 2018-05-28 NOTE — Telephone Encounter (Signed)
noted 

## 2018-06-16 ENCOUNTER — Other Ambulatory Visit: Payer: Self-pay | Admitting: Obstetrics and Gynecology

## 2018-06-16 DIAGNOSIS — Z1231 Encounter for screening mammogram for malignant neoplasm of breast: Secondary | ICD-10-CM

## 2018-06-26 ENCOUNTER — Other Ambulatory Visit: Payer: Self-pay | Admitting: Family

## 2018-07-14 ENCOUNTER — Telehealth: Payer: Self-pay

## 2018-07-14 NOTE — Telephone Encounter (Signed)
Virtual Visit Pre-Appointment Phone Call  Steps For Call:  1. Confirm consent - "In the setting of the current Covid19 crisis, you are scheduled for a VIDEO visit with your provider on 07/28/2018 at 11:40am.  Just as we do with many in-office visits, in order for you to participate in this visit, we must obtain consent.  If you'd like, I can send this to your mychart (if signed up) or email for you to review.  Otherwise, I can obtain your verbal consent now.  All virtual visits are billed to your insurance company just like a normal visit would be.  By agreeing to a virtual visit, we'd like you to understand that the technology does not allow for your provider to perform an examination, and thus may limit your provider's ability to fully assess your condition.  Finally, though the technology is pretty good, we cannot assure that it will always work on either your or our end, and in the setting of a video visit, we may have to convert it to a phone-only visit.  In either situation, we cannot ensure that we have a secure connection.  Are you willing to proceed?"  2. Give patient instructions for WebEx download to smartphone as below if video visit  3. Advise patient to be prepared with any vital sign or heart rhythm information, their current medicines, and a piece of paper and pen handy for any instructions they may receive the day of their visit  4. Inform patient they will receive a phone call 15 minutes prior to their appointment time (may be from unknown caller ID) so they should be prepared to answer  5. Confirm that appointment type is correct in Epic appointment notes (video vs telephone)    TELEPHONE CALL NOTE  Lindsay Mejia has been deemed a candidate for a follow-up tele-health visit to limit community exposure during the Covid-19 pandemic. I spoke with the patient via phone to ensure availability of phone/video source, confirm preferred email & phone number, and discuss  instructions and expectations.  I reminded Lindsay Mejia to be prepared with any vital sign and/or heart rhythm information that could potentially be obtained via home monitoring, at the time of her visit. I reminded Lindsay Mejia to expect a phone call at the time of her visit if her visit.  Did the patient verbally acknowledge consent to treatment? YES  Lindsay Mejia, [place] 07/14/2018 1:51 PM    CONSENT FOR TELE-HEALTH VISIT - PLEASE REVIEW  I hereby voluntarily request, consent and authorize [facility] and its employed or contracted physicians, physician assistants, nurse practitioners or other licensed health care professionals (the Practitioner), to provide me with telemedicine health care services (the Services") as deemed necessary by the treating Practitioner. I acknowledge and consent to receive the Services by the Practitioner via telemedicine. I understand that the telemedicine visit will involve communicating with the Practitioner through live audiovisual communication technology and the disclosure of certain medical information by electronic transmission. I acknowledge that I have been given the opportunity to request an in-person assessment or other available alternative prior to the telemedicine visit and am voluntarily participating in the telemedicine visit.  I understand that I have the right to withhold or withdraw my consent to the use of telemedicine in the course of my care at any time, without affecting my right to future care or treatment, and that the Practitioner or I may terminate the telemedicine visit at any time. I understand that  I have the right to inspect all information obtained and/or recorded in the course of the telemedicine visit and may receive copies of available information for a reasonable fee.  I understand that some of the potential risks of receiving the Services via telemedicine include:   Delay or interruption in medical evaluation due to  technological equipment failure or disruption;  Information transmitted may not be sufficient (e.g. poor resolution of images) to allow for appropriate medical decision making by the Practitioner; and/or   In rare instances, security protocols could fail, causing a breach of personal health information.  Furthermore, I acknowledge that it is my responsibility to provide information about my medical history, conditions and care that is complete and accurate to the best of my ability. I acknowledge that Practitioner's advice, recommendations, and/or decision may be based on factors not within their control, such as incomplete or inaccurate data provided by me or distortions of diagnostic images or specimens that may result from electronic transmissions. I understand that the practice of medicine is not an exact science and that Practitioner makes no warranties or guarantees regarding treatment outcomes. I acknowledge that I will receive a copy of this consent concurrently upon execution via email to the email address I last provided but may also request a printed copy by calling the office of [facility].    I understand that my insurance will be billed for this visit.   I have read or had this consent read to me.  I understand the contents of this consent, which adequately explains the benefits and risks of the Services being provided via telemedicine.   I have been provided ample opportunity to ask questions regarding this consent and the Services and have had my questions answered to my satisfaction.  I give my informed consent for the services to be provided through the use of telemedicine in my medical care  By participating in this telemedicine visit I agree to the above.

## 2018-07-27 NOTE — Progress Notes (Addendum)
Virtual Visit via Video Note   This visit type was conducted due to national recommendations for restrictions regarding the COVID-19 Pandemic (e.g. social distancing) in an effort to limit this patient's exposure and mitigate transmission in our community.  Due to her co-morbid illnesses, this patient is at least at moderate risk for complications without adequate follow up.  This format is felt to be most appropriate for this patient at this time.  All issues noted in this document were discussed and addressed.  A limited physical exam was performed with this format.  Please refer to the patient's chart for her consent to telehealth for [facility].    Date:  07/27/2018   ID:  Lindsay Mejia, DOB 1961-01-02, MRN [id number]  Patient Location:  [address] [place] [address]   Provider location:   [facility], [place] office  PCP:  Burnard Hawthorne, FNP  Cardiologist:  Patsy Baltimore  Chief Complaint: Chest tightness  New patient visit  History of Present Illness:    Lindsay Mejia is a 58 y.o. female who presents via audio/video conferencing for a telehealth visit today.   The patient does not symptoms concerning for COVID-19 infection (fever, chills, cough, or new SHORTNESS OF BREATH).   Patient has a past medical history of Hypertension Hyperlipidemia Diabetes, hemoglobin A1c in the 5 range Referred by Mable Paris December 2019 for consultation of her chest tightness  Notes from primary care reviewed from December 2019 One episode chest tightness  occurred in setting of URI   EKG was unchanged   On review of the situation to December 2019, she denies having upper respiratory infection Reports that she had Chills "really bad" Chest and arm was hurting a little bit, but she attributes this to tightening down on her body in the setting of the chills After that one episode she has not had any further episodes in the past 3 and half months  She  has been working out on a regular basis Does spinning, 1 hour, weights No symptoms of chest pain or shortness of breath with heavy activity  She takes Crestor on average twice a week Without Crestor total cholesterol up to 190s On Crestor sporadically total cholesterol 150 or less  Lab work reviewed with her in detail LDL 72 HBA1C 5.4  Discussed her family history with her  Father: CAD and CABG in 37s   Prior CV studies:   The following studies were reviewed today:   Past Medical History:  Diagnosis Date  . Anxiety   . Diabetes mellitus without complication ([place])   . Hyperlipidemia   . Hypertension   . Hypothyroidism   . Rosacea    Dr. Lovell Sheehan  . Thyroid disease 2000   s/p XRT to thyroid, Dr. Sabra Heck at [place]   Past Surgical History:  Procedure Laterality Date  . ABDOMINAL HYSTERECTOMY     has one ovary   . APPENDECTOMY    . BREAST BIOPSY Right 2006   neg  . BREAST SURGERY    . CESAREAN SECTION     4, Dr. Buford Dresser  . HERNIA REPAIR     abdominal  . KNEE ARTHROSCOPY W/ MENISCAL REPAIR     2     No outpatient medications have been marked as taking for the 07/28/18 encounter (Appointment) with Minna Merritts, MD.   Current Facility-Administered Medications for the 07/28/18 encounter (Appointment) with Minna Merritts, MD  Medication  . betamethasone acetate-betamethasone sodium phosphate (  CELESTONE) injection 3 mg     Allergies:   Atorvastatin   Social History   Tobacco Use  . Smoking status: Never Smoker  . Smokeless tobacco: Never Used  Substance Use Topics  . Alcohol use: Yes    Comment: occasionally  . Drug use: No     Current Outpatient Medications on File Prior to Visit  Medication Sig Dispense Refill  . aspirin 81 MG tablet Take 1 tablet (81 mg total) by mouth daily. 30 tablet 6  . benzonatate (TESSALON PERLES) 100 MG capsule Take 1 capsule (100 mg total) by mouth 3 (three) times daily as needed for cough. 30 capsule 0  . BIOTIN PO  Take by mouth.    . escitalopram (LEXAPRO) 10 MG tablet TAKE 1 TABLET(10 MG) BY MOUTH DAILY 90 tablet 1  . lisinopril-hydrochlorothiazide (PRINZIDE,ZESTORETIC) 10-12.5 MG tablet TAKE 1 TABLET BY MOUTH DAILY 90 tablet 1  . metFORMIN (GLUCOPHAGE) 500 MG tablet TAKE 1 TABLET(500 MG) BY MOUTH TWICE DAILY WITH A MEAL 180 tablet 3  . Multiple Vitamin (MULTIVITAMIN) tablet Take 1 tablet by mouth daily.    . Omega-3 Fatty Acids (FISH OIL) 1200 MG CAPS Take 1,200 mg by mouth daily. Reported on 06/14/2015    . rosuvastatin (CRESTOR) 10 MG tablet Take 1 tablet (10 mg total) by mouth daily. 90 tablet 3  . SYNTHROID 75 MCG tablet TAKE 1 TABLET(75 MCG) BY MOUTH DAILY 90 tablet 0  . trimethoprim-polymyxin b (POLYTRIM) ophthalmic solution Place 1 drop into the left eye every 4 (four) hours. 10 mL 0  . valACYclovir (VALTREX) 1000 MG tablet TAKE 2 TABLETS(2000 MG) BY MOUTH EVERY 12 HOURS FOR 1 DAY. START: ASAP AFTER SYMPTOM ONSET 6 tablet 0   Current Facility-Administered Medications on File Prior to Visit  Medication Dose Route Frequency Provider Last Rate Last Dose  . betamethasone acetate-betamethasone sodium phosphate (CELESTONE) injection 3 mg  3 mg Intramuscular Once Edrick Kins, DPM         Family Hx: The patient's family history includes Breast cancer (age of onset: 1) in her mother; Colon polyps in her mother; Heart disease in her father; Hyperlipidemia in her mother; Hypertension in her brother, father, and mother. There is no history of Colon cancer or Ovarian cancer.  ROS:   Please see the history of present illness.    Review of Systems  Constitutional: Negative.   Respiratory: Negative.   Cardiovascular: Negative.        Episode chest tightness  Gastrointestinal: Negative.   Musculoskeletal: Negative.   Neurological: Negative.   Psychiatric/Behavioral: Negative.   All other systems reviewed and are negative.    Labs/Other Tests and Data Reviewed:    Recent Labs: 01/14/2018: ALT 13;  BUN 14; Creatinine, Ser 0.82; Potassium 4.3; Sodium 140 04/11/2018: TSH 0.90   Recent Lipid Panel Lab Results  Component Value Date/Time   CHOL 146 04/11/2018 10:06 AM   TRIG 128.0 04/11/2018 10:06 AM   HDL 48.70 04/11/2018 10:06 AM   CHOLHDL 3 04/11/2018 10:06 AM   LDLCALC 72 04/11/2018 10:06 AM   LDLDIRECT 130.9 03/19/2013 08:17 AM    Wt Readings from Last 3 Encounters:  04/11/18 152 lb 12.8 oz (69.3 kg)  03/31/18 152 lb (68.9 kg)  01/07/18 150 lb 9.6 oz (68.3 kg)     Exam:    Vital Signs: Vital signs may also be detailed in the HPI There were no vitals taken for this visit.  Wt Readings from Last 3 Encounters:  04/11/18  152 lb 12.8 oz (69.3 kg)  03/31/18 152 lb (68.9 kg)  01/07/18 150 lb 9.6 oz (68.3 kg)   Temp Readings from Last 3 Encounters:  04/11/18 98.1 F (36.7 C) (Oral)  03/31/18 98.3 F (36.8 C) (Oral)  10/11/17 98.7 F (37.1 C) (Oral)   BP Readings from Last 3 Encounters:  04/11/18 122/78  03/31/18 108/60  01/07/18 111/72   Pulse Readings from Last 3 Encounters:  04/11/18 65  03/31/18 73  01/07/18 63    Blood pressure 120/70, heart rate 60s  Well nourished, well developed female in no acute distress. Constitutional:  oriented to person, place, and time. No distress.  Head: Normocephalic and atraumatic.  Eyes:  no discharge. No scleral icterus.  Neck: Normal range of motion. Neck supple.  Pulmonary/Chest: No audible wheezing, no distress, appears comfortable Musculoskeletal: Normal range of motion.  no  tenderness or deformity.  Neurological:   Coordination normal. Full exam not performed Skin:  No rash Psychiatric:  normal mood and affect. behavior is normal. Thought content normal.    ASSESSMENT & PLAN:    Chest tightness Atypical symptoms December 2019 She has not had any further episodes She does have risk factors for coronary disease including hyperlipidemia Numbers better on periodic Crestor Hemoglobin A1c well controlled Given  exercising without symptoms over 3 months we have not ordered any ischemic work-up -We did discuss risk stratification studies, CT coronary calcium scoring She will call us if she would like this ordered  Controlled type 2 diabetes mellitus without complication, without long-term current use of insulin (HCC) Numbers well-controlled, hemoglobin A1c in the 5 range Encouraged her to continue her aggressive exercise regiment  Essential hypertension, benign Blood pressure is well controlled on today's visit. No changes made to the medications.  Overweight (BMI 25.0-29.9) Stressed importance that she continue her exercise regimen  Hyperlipidemia, unspecified hyperlipidemia type Taking Crestor several days a week.  Cholesterol down from 190s down to 150s with minimal Crestor   COVID-19 Education: The signs and symptoms of COVID-19 were discussed with the patient and how to seek care for testing (follow up with PCP or arrange E-visit).  The importance of social distancing was discussed today.  Patient Risk:   After full review of this patients clinical status, I feel that they are at least moderate risk at this time.  Time:   Today, I have spent 45 minutes with the patient with telehealth technology discussing the cardiac and medical problems/diagnoses detailed above     Medication Adjustments/Labs and Tests Ordered: Current medicines are reviewed at length with the patient today.  Concerns regarding medicines are outlined above.   Tests Ordered: No tests ordered  Please call if you would like CT coronary calcium score  Medication Changes: No changes made   Disposition: Follow-up as needed   Signed, Ida Rogue, MD  07/27/2018 12:48 PM    [place] Office [address], [place], [place] [address]

## 2018-07-28 ENCOUNTER — Telehealth (INDEPENDENT_AMBULATORY_CARE_PROVIDER_SITE_OTHER): Payer: BLUE CROSS/BLUE SHIELD | Admitting: Cardiovascular Disease

## 2018-07-28 ENCOUNTER — Other Ambulatory Visit: Payer: Self-pay

## 2018-07-28 DIAGNOSIS — E119 Type 2 diabetes mellitus without complications: Secondary | ICD-10-CM | POA: Diagnosis not present

## 2018-07-28 DIAGNOSIS — E663 Overweight: Secondary | ICD-10-CM

## 2018-07-28 DIAGNOSIS — E785 Hyperlipidemia, unspecified: Secondary | ICD-10-CM

## 2018-07-28 DIAGNOSIS — I10 Essential (primary) hypertension: Secondary | ICD-10-CM | POA: Diagnosis not present

## 2018-07-28 DIAGNOSIS — R0789 Other chest pain: Secondary | ICD-10-CM

## 2018-07-28 DIAGNOSIS — E782 Mixed hyperlipidemia: Secondary | ICD-10-CM

## 2018-07-28 NOTE — Patient Instructions (Signed)
Please call if you have more chest pain, We could order a CT coronary calcium score (no order at this time)   Medication Instructions:  No changes  If you need a refill on your cardiac medications before your next appointment, please call your pharmacy.    Lab work: No new labs needed   If you have labs (blood work) drawn today and your tests are completely normal, you will receive your results only by: Marland Kitchen MyChart Message (if you have MyChart) OR . A paper copy in the mail If you have any lab test that is abnormal or we need to change your treatment, we will call you to review the results.   Testing/Procedures: No new testing needed   Follow-Up: At [facility], you and your health needs are our priority.  As part of our continuing mission to provide you with exceptional heart care, we have created designated Provider Care Teams.  These Care Teams include your primary Cardiologist (physician) and Advanced Practice Providers (APPs -  Physician Assistants and Nurse Practitioners) who all work together to provide you with the care you need, when you need it.  . You will need a follow up appointment as needed  . Providers on your designated Care Team:   . Murray Hodgkins, NP . Christell Faith, PA-C . Marrianne Mood, PA-C  Any Other Special Instructions Will Be Listed Below (If Applicable).  For educational health videos Log in to : [email or web address] Or : [email or web address], password : [email or web address]

## 2018-08-01 ENCOUNTER — Other Ambulatory Visit: Payer: Self-pay | Admitting: Family

## 2018-08-01 DIAGNOSIS — E119 Type 2 diabetes mellitus without complications: Secondary | ICD-10-CM

## 2018-08-26 ENCOUNTER — Other Ambulatory Visit: Payer: Self-pay | Admitting: Family

## 2018-08-26 DIAGNOSIS — E039 Hypothyroidism, unspecified: Secondary | ICD-10-CM

## 2018-09-10 DIAGNOSIS — D2271 Melanocytic nevi of right lower limb, including hip: Secondary | ICD-10-CM | POA: Diagnosis not present

## 2018-09-10 DIAGNOSIS — D2261 Melanocytic nevi of right upper limb, including shoulder: Secondary | ICD-10-CM | POA: Diagnosis not present

## 2018-09-10 DIAGNOSIS — D225 Melanocytic nevi of trunk: Secondary | ICD-10-CM | POA: Diagnosis not present

## 2018-09-10 DIAGNOSIS — D2262 Melanocytic nevi of left upper limb, including shoulder: Secondary | ICD-10-CM | POA: Diagnosis not present

## 2018-09-23 ENCOUNTER — Other Ambulatory Visit: Payer: Self-pay | Admitting: Family

## 2018-10-15 ENCOUNTER — Encounter: Payer: Self-pay | Admitting: Family

## 2018-10-15 ENCOUNTER — Ambulatory Visit: Payer: 59 | Admitting: Family

## 2018-10-15 ENCOUNTER — Ambulatory Visit (INDEPENDENT_AMBULATORY_CARE_PROVIDER_SITE_OTHER): Payer: BC Managed Care – PPO | Admitting: Family

## 2018-10-15 ENCOUNTER — Other Ambulatory Visit: Payer: Self-pay

## 2018-10-15 DIAGNOSIS — E119 Type 2 diabetes mellitus without complications: Secondary | ICD-10-CM

## 2018-10-15 DIAGNOSIS — F329 Major depressive disorder, single episode, unspecified: Secondary | ICD-10-CM

## 2018-10-15 DIAGNOSIS — I10 Essential (primary) hypertension: Secondary | ICD-10-CM | POA: Diagnosis not present

## 2018-10-15 DIAGNOSIS — E039 Hypothyroidism, unspecified: Secondary | ICD-10-CM

## 2018-10-15 DIAGNOSIS — R42 Dizziness and giddiness: Secondary | ICD-10-CM | POA: Insufficient documentation

## 2018-10-15 DIAGNOSIS — F419 Anxiety disorder, unspecified: Secondary | ICD-10-CM

## 2018-10-15 DIAGNOSIS — E782 Mixed hyperlipidemia: Secondary | ICD-10-CM

## 2018-10-15 NOTE — Assessment & Plan Note (Signed)
One episode. Self resolved. H/o tinnitus. Discussed limitations of virtual medicine however we did discuss BPPV and also meniere's disease. She will stay vigilant in regard to recurrence or new symptoms and let me know. Pending ent consult.

## 2018-10-15 NOTE — Assessment & Plan Note (Signed)
Pending a1c. 

## 2018-10-15 NOTE — Assessment & Plan Note (Signed)
Now on generic , pending tsh

## 2018-10-15 NOTE — Assessment & Plan Note (Signed)
Crestor 3 days per week; pending lipid panel

## 2018-10-15 NOTE — Assessment & Plan Note (Signed)
Stable. Continue regimen. 

## 2018-10-15 NOTE — Patient Instructions (Signed)
Please send me blood pressure readings as I want to ensure we are at goal.   Monitor blood pressure,  Goal is less than 120/80, based on newest guidelines; if persistently higher, please make sooner follow up appointment so we can recheck you blood pressure and manage medications  Let me know if vertigo recurs, and particularly if new symptoms were to develop such as hearing, vision changes, headaches, balance problems.   Today we discussed referrals, orders. ENT, Dr Wynonia Musty   I have placed these orders in the system for you.  Please be sure to give [place] a call if you have not heard from our office regarding this. We should hear from [place] within ONE week with information regarding your appointment. If not, please let me know immediately.   We will call you to schedule appointment for fasting labs  Nice to see you and stay safe!   Vertigo Vertigo is the feeling that you or your surroundings are moving when they are not. This feeling can come and go at any time. Vertigo often goes away on its own. Vertigo can be dangerous if it occurs while you are doing something that could endanger you or others, such as driving or operating machinery. Your health care provider will do tests to determine the cause of your vertigo. Tests will also help your health care provider decide how best to treat your condition. Follow these instructions at home: Eating and drinking      Drink enough fluid to keep your urine pale yellow.  Do not drink alcohol. Activity  Return to your normal activities as told by your health care provider. Ask your health care provider what activities are safe for you.  In the morning, first sit up on the side of the bed. When you feel okay, stand slowly while you hold onto something until you know that your balance is fine.  Move slowly. Avoid sudden body or head movements or certain positions, as told by your health care provider.  If you have trouble walking or keeping your  balance, try using a cane for stability. If you feel dizzy or unstable, sit down right away.  Avoid doing any tasks that would cause danger to you or others if vertigo occurs.  Avoid bending down if you feel dizzy. Place items in your home so that they are easy for you to reach without leaning over.  Do not drive or use heavy machinery if you feel dizzy. General instructions  Take over-the-counter and prescription medicines only as told by your health care provider.  Keep all follow-up visits as told by your health care provider. This is important. Contact a health care provider if:  Your medicines do not relieve your vertigo or they make it worse.  You have a fever.  Your condition gets worse or you develop new symptoms.  Your family or friends notice any behavioral changes.  Your nausea or vomiting gets worse.  You have numbness or a prickling and tingling sensation in part of your body. Get help right away if you:  Have difficulty moving or speaking.  Are always dizzy.  Faint.  Develop severe headaches.  Have weakness in your hands, arms, or legs.  Have changes in your hearing or vision.  Develop a stiff neck.  Develop sensitivity to light. Summary  Vertigo is the feeling that you or your surroundings are moving when they are not.  Your health care provider will do tests to determine the cause of your vertigo.  Follow instructions for home care. You may be told to avoid certain tasks, positions, or movements.  Contact a health care provider if your medicines do not relieve your symptoms, or if you have a fever, nausea, vomiting, or changes in behavior.  Get help right away if you have severe headaches or difficulty speaking, or you develop hearing or vision problems. This information is not intended to replace advice given to you by your health care provider. Make sure you discuss any questions you have with your health care provider. Document Released:  01/10/2005 Document Revised: 02/24/2018 Document Reviewed: 02/24/2018 Elsevier Patient Education  2020 [employer].

## 2018-10-15 NOTE — Progress Notes (Signed)
This visit type was conducted due to national recommendations for restrictions regarding the COVID-19 pandemic (e.g. social distancing).  This format is felt to be most appropriate for this patient at this time.  All issues noted in this document were discussed and addressed.  No physical exam was performed (except for noted visual exam findings with Video Visits). Virtual Visit via Video Note  I connected with@  on 10/15/18 at  4:00 PM EDT by a video enabled telemedicine application and verified that I am speaking with the correct person using two identifiers.  Location patient: home Location provider:home office Persons participating in the virtual visit: patient, provider  I discussed the limitations of evaluation and management by telemedicine and the availability of in person appointments. The patient expressed understanding and agreed to proceed.   HPI:  Feels well today. Heading to go shopping.   One episode of 'vertigo' when getting out of bed, few weeks ago, which resolved on it's own. Ringing in ears ( both) for some time. No ear discharge, hearing changes, vision changes, HA, balance problems.    DM- compliant with medications  HTN-at home, 150/ 77 however this was when she was anxious.   Denies exertional chest pain or pressure, numbness or tingling radiating to left arm or jaw, palpitations,  frequent headaches, changes in vision, or shortness of breath.    HLD- on crestor 3 times per week.    Hypothyroidism- Synthroid switched from brand to generic( on this now).    Anxiety and depression- doing well on lexapro.   Chest tightness in 03/2018; none recurrent.  consult with Dr Rockey Situ. Planning to do CT cardiac scoring.    ROS: See pertinent positives and negatives per HPI.  Past Medical History:  Diagnosis Date  . Anxiety   . Diabetes mellitus without complication ([place])   . Hyperlipidemia   . Hypertension   . Hypothyroidism   . Rosacea    Dr. Lovell Sheehan  . Thyroid  disease 2000   s/p XRT to thyroid, Dr. Sabra Heck at [place]    Past Surgical History:  Procedure Laterality Date  . ABDOMINAL HYSTERECTOMY     has one ovary   . APPENDECTOMY    . BREAST BIOPSY Right 2006   neg  . BREAST SURGERY    . CESAREAN SECTION     4, Dr. Buford Dresser  . HERNIA REPAIR     abdominal  . KNEE ARTHROSCOPY W/ MENISCAL REPAIR     2    Family History  Problem Relation Age of Onset  . Hyperlipidemia Mother   . Hypertension Mother   . Breast cancer Mother 78  . Colon polyps Mother   . Hypertension Father   . Heart disease Father   . Hypertension Brother   . Colon cancer Neg Hx   . Ovarian cancer Neg Hx     SOCIAL HX: never smoker   Current Outpatient Medications:  .  BIOTIN PO, Take by mouth., Disp: , Rfl:  .  escitalopram (LEXAPRO) 10 MG tablet, TAKE 1 TABLET(10 MG) BY MOUTH DAILY, Disp: 90 tablet, Rfl: 1 .  levothyroxine (SYNTHROID) 75 MCG tablet, TAKE 1 TABLET(75 MCG) BY MOUTH DAILY ON AN EMPTY STOMACH, Disp: 90 tablet, Rfl: 0 .  lisinopril-hydrochlorothiazide (ZESTORETIC) 10-12.5 MG tablet, TAKE 1 TABLET BY MOUTH DAILY, Disp: 90 tablet, Rfl: 1 .  metFORMIN (GLUCOPHAGE) 500 MG tablet, TAKE 1 TABLET(500 MG) BY MOUTH TWICE DAILY WITH A MEAL, Disp: 180 tablet, Rfl: 3 .  Multiple Vitamin (MULTIVITAMIN) tablet, Take  1 tablet by mouth daily., Disp: , Rfl:  .  Omega-3 Fatty Acids (FISH OIL) 1200 MG CAPS, Take 1,200 mg by mouth daily. Reported on 06/14/2015, Disp: , Rfl:  .  rosuvastatin (CRESTOR) 10 MG tablet, Take 1 tablet (10 mg total) by mouth daily., Disp: 90 tablet, Rfl: 3 .  valACYclovir (VALTREX) 1000 MG tablet, TAKE 2 TABLETS(2000 MG) BY MOUTH EVERY 12 HOURS FOR 1 DAY. START: ASAP AFTER SYMPTOM ONSET, Disp: 6 tablet, Rfl: 0 .  aspirin 81 MG tablet, Take 1 tablet (81 mg total) by mouth daily. (Patient not taking: Reported on 10/15/2018), Disp: 30 tablet, Rfl: 6  Current Facility-Administered Medications:  .  betamethasone acetate-betamethasone sodium phosphate  (CELESTONE) injection 3 mg, 3 mg, Intramuscular, Once, Evans, Dorathy Daft, DPM  EXAM:  VITALS per patient if applicable:  GENERAL: alert, oriented, appears well and in no acute distress  HEENT: atraumatic, conjunttiva clear, no obvious abnormalities on inspection of external nose and ears  NECK: normal movements of the head and neck  LUNGS: on inspection no signs of respiratory distress, breathing rate appears normal, no obvious gross SOB, gasping or wheezing  CV: no obvious cyanosis  MS: moves all visible extremities without noticeable abnormality  PSYCH/NEURO: pleasant and cooperative, no obvious depression or anxiety, speech and thought processing grossly intact  ASSESSMENT AND PLAN:  Discussed the following assessment and plan:  Problem List Items Addressed This Visit      Cardiovascular and Mediastinum   Essential hypertension, benign - Primary (Chronic)    BP Readings from Last 3 Encounters:  04/11/18 122/78  03/31/18 108/60  01/07/18 111/72   Have asked patient for log from home to ensure we at goal. Will follow       Relevant Orders   Comprehensive metabolic panel     Endocrine   Diabetes type 2, controlled (HCC) (Chronic)    Pending a1c      Relevant Orders   Hemoglobin A1c   Lipid panel   Hypothyroidism    Now on generic , pending tsh      Relevant Orders   TSH     Other   HLD (hyperlipidemia)    Crestor 3 days per week; pending lipid panel      Anxiety and depression    Stable. Continue regimen      Vertigo    One episode. Self resolved. H/o tinnitus. Discussed limitations of virtual medicine however we did discuss BPPV and also meniere's disease. She will stay vigilant in regard to recurrence or new symptoms and let me know. Pending ent consult.       Relevant Orders   Ambulatory referral to ENT        I discussed the assessment and treatment plan with the patient. The patient was provided an opportunity to ask questions and all were  answered. The patient agreed with the plan and demonstrated an understanding of the instructions.   The patient was advised to call back or seek an in-person evaluation if the symptoms worsen or if the condition fails to improve as anticipated.   Mable Paris, FNP

## 2018-10-15 NOTE — Assessment & Plan Note (Signed)
BP Readings from Last 3 Encounters:  04/11/18 122/78  03/31/18 108/60  01/07/18 111/72   Have asked patient for log from home to ensure we at goal. Will follow

## 2018-10-16 NOTE — Progress Notes (Signed)
Patient scheduled for fasting labs Monday.

## 2018-10-20 ENCOUNTER — Other Ambulatory Visit (INDEPENDENT_AMBULATORY_CARE_PROVIDER_SITE_OTHER): Payer: BC Managed Care – PPO

## 2018-10-20 ENCOUNTER — Other Ambulatory Visit: Payer: Self-pay

## 2018-10-20 DIAGNOSIS — E119 Type 2 diabetes mellitus without complications: Secondary | ICD-10-CM

## 2018-10-20 DIAGNOSIS — E039 Hypothyroidism, unspecified: Secondary | ICD-10-CM | POA: Diagnosis not present

## 2018-10-20 DIAGNOSIS — I10 Essential (primary) hypertension: Secondary | ICD-10-CM

## 2018-10-21 LAB — COMPREHENSIVE METABOLIC PANEL
ALT: 13 U/L (ref 0–35)
AST: 19 U/L (ref 0–37)
Albumin: 4.4 g/dL (ref 3.5–5.2)
Alkaline Phosphatase: 58 U/L (ref 39–117)
BUN: 11 mg/dL (ref 6–23)
CO2: 31 mEq/L (ref 19–32)
Calcium: 9.3 mg/dL (ref 8.4–10.5)
Chloride: 100 mEq/L (ref 96–112)
Creatinine, Ser: 0.83 mg/dL (ref 0.40–1.20)
GFR: 70.14 mL/min (ref >60.00)
Glucose, Bld: 87 mg/dL (ref 70–99)
Potassium: 3.5 mEq/L (ref 3.5–5.1)
Sodium: 140 mEq/L (ref 135–145)
Total Bilirubin: 0.8 mg/dL (ref 0.2–1.2)
Total Protein: 7 g/dL (ref 6.0–8.3)

## 2018-10-21 LAB — LIPID PANEL
Cholesterol: 127 mg/dL (ref 0–200)
HDL: 43.1 mg/dL (ref >39.00)
LDL Cholesterol: 56 mg/dL (ref 0–99)
NonHDL: 83.7
Total CHOL/HDL Ratio: 3
Triglycerides: 141 mg/dL (ref 0.0–149.0)
VLDL: 28.2 mg/dL (ref 0.0–40.0)

## 2018-10-21 LAB — TSH: TSH: 0.55 u[IU]/mL (ref 0.35–4.50)

## 2018-10-21 LAB — HEMOGLOBIN A1C: Hgb A1c MFr Bld: 5.7 % (ref 4.6–6.5)

## 2018-10-23 ENCOUNTER — Ambulatory Visit: Payer: BC Managed Care – PPO

## 2018-10-31 ENCOUNTER — Telehealth: Payer: Self-pay | Admitting: Cardiovascular Disease

## 2018-10-31 DIAGNOSIS — R079 Chest pain, unspecified: Secondary | ICD-10-CM

## 2018-10-31 NOTE — Telephone Encounter (Signed)
Patient called in regarding her visit back in April, she is now wanting to schedule the CT coronary calcium score test. An order will need to be made. Please advise

## 2018-10-31 NOTE — Telephone Encounter (Signed)
Ordered for CT Calcium Score placed.    I spoke with the patient. She is aware the order is in and that she will just need to call [phone] to schedule.  She is also aware this is a $150 out of pocket cost collected at the time of her visit.

## 2018-11-05 DIAGNOSIS — H9313 Tinnitus, bilateral: Secondary | ICD-10-CM | POA: Diagnosis not present

## 2018-11-05 DIAGNOSIS — H9319 Tinnitus, unspecified ear: Secondary | ICD-10-CM | POA: Diagnosis not present

## 2018-11-05 DIAGNOSIS — H903 Sensorineural hearing loss, bilateral: Secondary | ICD-10-CM | POA: Diagnosis not present

## 2018-11-06 ENCOUNTER — Other Ambulatory Visit: Payer: Self-pay

## 2018-11-06 ENCOUNTER — Ambulatory Visit
Admission: RE | Admit: 2018-11-06 | Discharge: 2018-11-06 | Disposition: A | Discharge status: Home / Self Care | Payer: BC Managed Care – PPO | Source: Ambulatory Visit | Attending: Obstetrics and Gynecology | Admitting: Obstetrics and Gynecology

## 2018-11-06 ENCOUNTER — Other Ambulatory Visit: Payer: Self-pay | Admitting: Family

## 2018-11-06 DIAGNOSIS — Z1231 Encounter for screening mammogram for malignant neoplasm of breast: Secondary | ICD-10-CM | POA: Insufficient documentation

## 2018-11-06 DIAGNOSIS — B001 Herpesviral vesicular dermatitis: Secondary | ICD-10-CM

## 2018-11-06 NOTE — Telephone Encounter (Signed)
Noticed one time fill ok to refill?

## 2018-11-21 ENCOUNTER — Other Ambulatory Visit: Payer: Self-pay | Admitting: Family

## 2018-11-21 DIAGNOSIS — E039 Hypothyroidism, unspecified: Secondary | ICD-10-CM

## 2018-12-03 ENCOUNTER — Ambulatory Visit (INDEPENDENT_AMBULATORY_CARE_PROVIDER_SITE_OTHER)
Admission: RE | Admit: 2018-12-03 | Discharge: 2018-12-03 | Disposition: A | Discharge status: Home / Self Care | Payer: Self-pay | Source: Ambulatory Visit | Attending: Cardiovascular Disease | Admitting: Cardiovascular Disease

## 2018-12-03 ENCOUNTER — Other Ambulatory Visit: Payer: Self-pay

## 2018-12-03 DIAGNOSIS — R079 Chest pain, unspecified: Secondary | ICD-10-CM

## 2018-12-24 ENCOUNTER — Other Ambulatory Visit: Payer: Self-pay

## 2018-12-24 MED ORDER — ESCITALOPRAM OXALATE 10 MG PO TABS: ORAL_TABLET | ORAL | 1 refills | Status: DC

## 2019-01-09 ENCOUNTER — Encounter: Payer: Self-pay | Admitting: Obstetrics and Gynecology

## 2019-01-09 ENCOUNTER — Ambulatory Visit (INDEPENDENT_AMBULATORY_CARE_PROVIDER_SITE_OTHER): Payer: BC Managed Care – PPO | Admitting: Obstetrics and Gynecology

## 2019-01-09 ENCOUNTER — Other Ambulatory Visit: Payer: Self-pay

## 2019-01-09 VITALS — Ht 63.0 in | Wt 152.7 lb

## 2019-01-09 DIAGNOSIS — Z23 Encounter for immunization: Secondary | ICD-10-CM | POA: Diagnosis not present

## 2019-01-09 DIAGNOSIS — Z01419 Encounter for gynecological examination (general) (routine) without abnormal findings: Secondary | ICD-10-CM | POA: Diagnosis not present

## 2019-01-09 DIAGNOSIS — R42 Dizziness and giddiness: Secondary | ICD-10-CM

## 2019-01-09 MED ORDER — MECLIZINE HCL 12.5 MG PO TABS: 12.5000 mg | ORAL_TABLET | Freq: Three times a day (TID) | ORAL | 2 refills | Status: DC | PRN

## 2019-01-09 NOTE — Patient Instructions (Signed)

## 2019-01-09 NOTE — Progress Notes (Signed)
Subjective:   Lindsay Mejia is a 58 y.o. G14P4 Caucasian female here for a routine well-woman exam.  No LMP recorded. Patient has had a hysterectomy.    Current complaints: new onset vertigo, happens more at night. Or when reaches over her head. Saw ear doctor a few months ago for ringing in the ear.  PCP: Ar[facility]       doesn't desire labs  Social History: Sexual: heterosexual Marital Status: widowed Living situation: alone Occupation: retired Tobacco/alcohol: no tobacco use Illicit drugs: no history of illicit drug use  The following portions of the patient's history were reviewed and updated as appropriate: allergies, current medications, past family history, past medical history, past social history, past surgical history and problem list.  Past Medical History Past Medical History:  Diagnosis Date  . Anxiety   . Diabetes mellitus without complication ([place])   . Hyperlipidemia   . Hypertension   . Hypothyroidism   . Rosacea    Dr. Lovell Sheehan  . Thyroid disease 2000   s/p XRT to thyroid, Dr. Sabra Heck at [facility]    Past Surgical History Past Surgical History:  Procedure Laterality Date  . ABDOMINAL HYSTERECTOMY     has one ovary   . APPENDECTOMY    . BREAST BIOPSY Right 2006   neg  . BREAST SURGERY    . CESAREAN SECTION     4, Dr. Buford Dresser  . HERNIA REPAIR     abdominal  . KNEE ARTHROSCOPY W/ MENISCAL REPAIR     2    Gynecologic History G4P4  No LMP recorded. Patient has had a hysterectomy. Contraception: status post hysterectomy Last Pap: 2016. Results were: normal Last mammogram: 10/2018. Results were: normal   Obstetric History OB History  Gravida Para Term Preterm AB Living  4 4          SAB TAB Ectopic Multiple Live Births               # Outcome Date GA Lbr Len/2nd Weight Sex Delivery Anes PTL Lv  4 Para      CS-Unspec     3 Para      CS-Unspec     2 Para      CS-Unspec     1 Para      CS-Unspec       Current Medications Current Outpatient  Medications on File Prior to Visit  Medication Sig Dispense Refill  . BIOTIN PO Take by mouth.    . escitalopram (LEXAPRO) 10 MG tablet Take one tablet by mouth daily. 90 tablet 1  . levothyroxine (SYNTHROID) 75 MCG tablet TAKE 1 TABLET(75 MCG) BY MOUTH DAILY ON AN EMPTY STOMACH 90 tablet 0  . lisinopril-hydrochlorothiazide (ZESTORETIC) 10-12.5 MG tablet TAKE 1 TABLET BY MOUTH DAILY 90 tablet 1  . omega-3 acid ethyl esters (LOVAZA) 1 g capsule Take by mouth 2 (two) times daily.    . rosuvastatin (CRESTOR) 10 MG tablet Take 10 mg by mouth daily.    Marland Kitchen aspirin 81 MG tablet Take 1 tablet (81 mg total) by mouth daily. (Patient not taking: Reported on 10/15/2018) 30 tablet 6  . metFORMIN (GLUCOPHAGE) 500 MG tablet TAKE 1 TABLET(500 MG) BY MOUTH TWICE DAILY WITH A MEAL 180 tablet 3  . Multiple Vitamin (MULTIVITAMIN) tablet Take 1 tablet by mouth daily.    . Omega-3 Fatty Acids (FISH OIL) 1200 MG CAPS Take 1,200 mg by mouth daily. Reported on 06/14/2015    . rosuvastatin (CRESTOR) 10 MG tablet Take  1 tablet (10 mg total) by mouth daily. 90 tablet 3  . valACYclovir (VALTREX) 1000 MG tablet TAKE 2 TABLETS(2000 MG) BY MOUTH EVERY 12 HOURS FOR 1 DAY. START: ASAP AFTER SYMPTOM ONSET 30 tablet 1   Current Facility-Administered Medications on File Prior to Visit  Medication Dose Route Frequency Provider Last Rate Last Dose  . betamethasone acetate-betamethasone sodium phosphate (CELESTONE) injection 3 mg  3 mg Intramuscular Once Edrick Kins, DPM        Review of Systems Patient denies any headaches, blurred vision, shortness of breath, chest pain, abdominal pain, problems with bowel movements, urination, or intercourse.  Objective:  Ht 5\' 3"  (1.6 m)   Wt 152 lb 11.2 oz (69.3 kg)   BMI 27.05 kg/m  Physical Exam  General:  Well developed, well nourished, no acute distress. She is alert and oriented x3. Skin:  Warm and dry Neck:  Midline trachea, no thyromegaly or nodules Cardiovascular: Regular rate  and rhythm, no murmur heard Lungs:  Effort normal, all lung fields clear to auscultation bilaterally Breasts:  No dominant palpable mass, retraction, or nipple discharge Abdomen:  Soft, non tender, no hepatosplenomegaly or masses Pelvic:  External genitalia is normal in appearance.  The vagina is normal in appearance. The cervix is bulbous, no CMT.  Thin prep pap is not done . Uterus is surgically absent  No adnexal masses or tenderness noted. Extremities:  No swelling or varicosities noted Psych:  She has a normal mood and affect  Assessment:   Healthy well-woman exam Vertigo   Plan:  Information on vertigo in AVS to review and RX for meclizine sent in. F/U 1 year for AE, or sooner if needed Mammogram up to date Colonoscopy just done  Jinx Gilden Rockney Ghee, CNM

## 2019-01-25 ENCOUNTER — Other Ambulatory Visit: Payer: Self-pay | Admitting: Family

## 2019-01-25 DIAGNOSIS — E785 Hyperlipidemia, unspecified: Secondary | ICD-10-CM

## 2019-02-22 ENCOUNTER — Other Ambulatory Visit: Payer: Self-pay | Admitting: Family

## 2019-02-22 DIAGNOSIS — E039 Hypothyroidism, unspecified: Secondary | ICD-10-CM

## 2019-03-20 DIAGNOSIS — L718 Other rosacea: Secondary | ICD-10-CM | POA: Diagnosis not present

## 2019-03-20 DIAGNOSIS — Z09 Encounter for follow-up examination after completed treatment for conditions other than malignant neoplasm: Secondary | ICD-10-CM | POA: Diagnosis not present

## 2019-03-20 DIAGNOSIS — L821 Other seborrheic keratosis: Secondary | ICD-10-CM | POA: Diagnosis not present

## 2019-03-20 DIAGNOSIS — D225 Melanocytic nevi of trunk: Secondary | ICD-10-CM | POA: Diagnosis not present

## 2019-05-20 ENCOUNTER — Other Ambulatory Visit: Payer: Self-pay | Admitting: Family

## 2019-05-20 DIAGNOSIS — E039 Hypothyroidism, unspecified: Secondary | ICD-10-CM

## 2019-07-11 ENCOUNTER — Other Ambulatory Visit: Payer: Self-pay | Admitting: Family

## 2019-07-11 DIAGNOSIS — E119 Type 2 diabetes mellitus without complications: Secondary | ICD-10-CM

## 2019-07-12 ENCOUNTER — Ambulatory Visit: Payer: BC Managed Care – PPO | Attending: Internal Medicine

## 2019-07-12 DIAGNOSIS — Z23 Encounter for immunization: Secondary | ICD-10-CM

## 2019-07-12 NOTE — Progress Notes (Addendum)
   Covid-19 Vaccination Clinic  Name:  Lindsay Mejia    MRN: [id number] DOB: Apr 29, 1960  07/12/2019  Lindsay Mejia was observed post Covid-19 immunization for 30 minutes without incident. She was provided with Vaccine Information Sheet and instruction to access the V-Safe system.   Lindsay Mejia was instructed to call 911 with any severe reactions post vaccine: Marland Kitchen Difficulty breathing  . Swelling of face and throat  . A fast heartbeat  . A bad rash all over body  . Dizziness and weakness   Immunizations Administered    Name Date Dose VIS Date Route   Pfizer COVID-19 Vaccine 07/12/2019  8:50 AM 0.3 mL 03/27/2019 Intramuscular   Manufacturer: [place]   Lot: [id number]   [place]: [id number]

## 2019-08-04 ENCOUNTER — Ambulatory Visit: Payer: BC Managed Care – PPO | Attending: Internal Medicine

## 2019-08-04 DIAGNOSIS — Z23 Encounter for immunization: Secondary | ICD-10-CM

## 2019-08-04 NOTE — Progress Notes (Signed)
   Covid-19 Vaccination Clinic  Name:  Lindsay Mejia    MRN: [id number] DOB: 1960/12/04  08/04/2019  [name] was observed post Covid-19 immunization for 15 minutes without incident. She was provided with Vaccine Information Sheet and instruction to access the V-Safe system.   [name] was instructed to call 911 with any severe reactions post vaccine: Marland Kitchen Difficulty breathing  . Swelling of face and throat  . A fast heartbeat  . A bad rash all over body  . Dizziness and weakness   Immunizations Administered    Name Date Dose VIS Date Route   Pfizer COVID-19 Vaccine 08/04/2019  9:26 AM 0.3 mL 06/10/2018 Intramuscular   Manufacturer: [employer], [employer]   Lot: [id number]   [place]: [id number]

## 2019-08-10 ENCOUNTER — Other Ambulatory Visit: Payer: Self-pay | Admitting: Family

## 2019-08-10 DIAGNOSIS — E039 Hypothyroidism, unspecified: Secondary | ICD-10-CM

## 2019-09-23 ENCOUNTER — Other Ambulatory Visit: Payer: Self-pay | Admitting: Family

## 2019-09-23 DIAGNOSIS — Z1231 Encounter for screening mammogram for malignant neoplasm of breast: Secondary | ICD-10-CM

## 2019-09-23 DIAGNOSIS — D2262 Melanocytic nevi of left upper limb, including shoulder: Secondary | ICD-10-CM | POA: Diagnosis not present

## 2019-09-23 DIAGNOSIS — D2261 Melanocytic nevi of right upper limb, including shoulder: Secondary | ICD-10-CM | POA: Diagnosis not present

## 2019-09-23 DIAGNOSIS — D2271 Melanocytic nevi of right lower limb, including hip: Secondary | ICD-10-CM | POA: Diagnosis not present

## 2019-09-23 DIAGNOSIS — D225 Melanocytic nevi of trunk: Secondary | ICD-10-CM | POA: Diagnosis not present

## 2019-10-28 ENCOUNTER — Telehealth: Payer: Self-pay | Admitting: *Deleted

## 2019-10-28 ENCOUNTER — Ambulatory Visit: Payer: BC Managed Care – PPO | Admitting: Family

## 2019-10-28 ENCOUNTER — Encounter: Payer: Self-pay | Admitting: Family

## 2019-10-28 ENCOUNTER — Other Ambulatory Visit: Payer: Self-pay

## 2019-10-28 VITALS — BP 96/64 | HR 65 | Temp 98.5°F | Resp 14 | Ht 63.0 in | Wt 129.4 lb

## 2019-10-28 DIAGNOSIS — F419 Anxiety disorder, unspecified: Secondary | ICD-10-CM

## 2019-10-28 DIAGNOSIS — F32A Depression, unspecified: Secondary | ICD-10-CM

## 2019-10-28 DIAGNOSIS — E119 Type 2 diabetes mellitus without complications: Secondary | ICD-10-CM | POA: Diagnosis not present

## 2019-10-28 DIAGNOSIS — I10 Essential (primary) hypertension: Secondary | ICD-10-CM | POA: Diagnosis not present

## 2019-10-28 DIAGNOSIS — F329 Major depressive disorder, single episode, unspecified: Secondary | ICD-10-CM

## 2019-10-28 DIAGNOSIS — E039 Hypothyroidism, unspecified: Secondary | ICD-10-CM

## 2019-10-28 LAB — CBC WITH DIFFERENTIAL/PLATELET
Basophils Absolute: 0 10*3/uL (ref 0.0–0.1)
Basophils Relative: 0.4 % (ref 0.0–3.0)
Eosinophils Absolute: 0.1 10*3/uL (ref 0.0–0.7)
Eosinophils Relative: 2.1 % (ref 0.0–5.0)
HCT: 39.5 % (ref 36.0–46.0)
Hemoglobin: 13.3 g/dL (ref 12.0–15.0)
Lymphocytes Relative: 30.6 % (ref 12.0–46.0)
Lymphs Abs: 2.2 10*3/uL (ref 0.7–4.0)
MCHC: 33.6 g/dL (ref 30.0–36.0)
MCV: 90.8 fl (ref 78.0–100.0)
Monocytes Absolute: 0.5 10*3/uL (ref 0.1–1.0)
Monocytes Relative: 6.5 % (ref 3.0–12.0)
Neutro Abs: 4.3 10*3/uL (ref 1.4–7.7)
Neutrophils Relative %: 60.4 % (ref 43.0–77.0)
Platelets: 250 10*3/uL (ref 150.0–400.0)
RBC: 4.35 Mil/uL (ref 3.87–5.11)
RDW: 14.9 % (ref 11.5–15.5)
WBC: 7.1 10*3/uL (ref 4.0–10.5)

## 2019-10-28 LAB — COMPREHENSIVE METABOLIC PANEL
ALT: 17 U/L (ref 0–35)
AST: 19 U/L (ref 0–37)
Albumin: 4.4 g/dL (ref 3.5–5.2)
Alkaline Phosphatase: 46 U/L (ref 39–117)
BUN: 19 mg/dL (ref 6–23)
CO2: 32 mEq/L (ref 19–32)
Calcium: 9.5 mg/dL (ref 8.4–10.5)
Chloride: 99 mEq/L (ref 96–112)
Creatinine, Ser: 0.8 mg/dL (ref 0.40–1.20)
GFR: 72.93 mL/min (ref >60.00)
Glucose, Bld: 90 mg/dL (ref 70–99)
Potassium: 3.5 mEq/L (ref 3.5–5.1)
Sodium: 138 mEq/L (ref 135–145)
Total Bilirubin: 0.7 mg/dL (ref 0.2–1.2)
Total Protein: 7.2 g/dL (ref 6.0–8.3)

## 2019-10-28 LAB — TSH: TSH: 0.52 u[IU]/mL (ref 0.35–4.50)

## 2019-10-28 LAB — LIPID PANEL
Cholesterol: 155 mg/dL (ref 0–200)
HDL: 53.3 mg/dL (ref >39.00)
LDL Cholesterol: 79 mg/dL (ref 0–99)
NonHDL: 102.01
Total CHOL/HDL Ratio: 3
Triglycerides: 113 mg/dL (ref 0.0–149.0)
VLDL: 22.6 mg/dL (ref 0.0–40.0)

## 2019-10-28 LAB — HEMOGLOBIN A1C: Hgb A1c MFr Bld: 5.5 % (ref 4.6–6.5)

## 2019-10-28 LAB — B12 AND FOLATE PANEL
Folate: >24.8 ng/mL (ref >5.9)
Vitamin B-12: 603 pg/mL (ref 211–911)

## 2019-10-28 LAB — VITAMIN D 25 HYDROXY (VIT D DEFICIENCY, FRACTURES): VITD: >120 ng/mL

## 2019-10-28 NOTE — Assessment & Plan Note (Signed)
Expect very well controlled, continue metformin for now.  We did discuss de-escalating/discontinuing Metformin pending a1c

## 2019-10-28 NOTE — Assessment & Plan Note (Signed)
Stable, continue lexapro

## 2019-10-28 NOTE — Progress Notes (Signed)
Subjective:    Patient ID: Lindsay Mejia, female    DOB: 03-24-1961, 59 y.o.   MRN: [id number]  CC: Lindsay Mejia is a 59 y.o. female who presents today for follow up.   HPI: Feels well today No complaints.  Intentional weightloss by eating less sugar and carbs.   HTN- compliant with medication. No cp.   HLD- compliant with crestor 10mg  3 times per week.  Dm- compliant with metformin  Hypothyroidism-compliant with Synthroid.  Anxiety and depression- compliant with lexapro and feels that it is helpful. No si/hi    Had been on b12 injections and felt more energized. Would like to see b12 level again  following [facility] and pap smear scheduled  HISTORY:  Past Medical History:  Diagnosis Date  . Anxiety   . Diabetes mellitus without complication ([place])   . Hyperlipidemia   . Hypertension   . Hypothyroidism   . Rosacea    Dr. Lovell Sheehan  . Thyroid disease 2000   s/p XRT to thyroid, Dr. Sabra Heck at [facility]   Past Surgical History:  Procedure Laterality Date  . ABDOMINAL HYSTERECTOMY     has one ovary   . APPENDECTOMY    . BREAST BIOPSY Right 2006   neg  . BREAST SURGERY    . CESAREAN SECTION     4, Dr. Buford Dresser  . HERNIA REPAIR     abdominal  . KNEE ARTHROSCOPY W/ MENISCAL REPAIR     2   Family History  Problem Relation Age of Onset  . Hyperlipidemia Mother   . Hypertension Mother   . Breast cancer Mother 7  . Colon polyps Mother   . Hypertension Father   . Heart disease Father   . Hypertension Brother   . Colon cancer Neg Hx   . Ovarian cancer Neg Hx     Allergies: Atorvastatin Current Outpatient Medications on File Prior to Visit  Medication Sig Dispense Refill  . aspirin 81 MG tablet Take 1 tablet (81 mg total) by mouth daily. 30 tablet 6  . BIOTIN PO Take by mouth.    . escitalopram (LEXAPRO) 10 MG tablet Take one tablet by mouth daily. 90 tablet 1  . levothyroxine (SYNTHROID) 75 MCG tablet TAKE 1 TABLET(75 MCG) BY MOUTH DAILY  ON AN EMPTY STOMACH 90 tablet 0  . lisinopril-hydrochlorothiazide (ZESTORETIC) 10-12.5 MG tablet TAKE 1 TABLET BY MOUTH DAILY 90 tablet 1  . meclizine (ANTIVERT) 12.5 MG tablet Take 1 tablet (12.5 mg total) by mouth 3 (three) times daily as needed for dizziness. 50 tablet 2  . metFORMIN (GLUCOPHAGE) 500 MG tablet TAKE 1 TABLET(500 MG) BY MOUTH TWICE DAILY WITH A MEAL 180 tablet 1  . Multiple Vitamin (MULTIVITAMIN) tablet Take 1 tablet by mouth daily.    . Omega-3 Fatty Acids (FISH OIL) 1200 MG CAPS Take 1,200 mg by mouth daily. Reported on 06/14/2015    . rosuvastatin (CRESTOR) 10 MG tablet Take 10 mg by mouth daily.    . valACYclovir (VALTREX) 1000 MG tablet TAKE 2 TABLETS(2000 MG) BY MOUTH EVERY 12 HOURS FOR 1 DAY. START: ASAP AFTER SYMPTOM ONSET 30 tablet 1   Current Facility-Administered Medications on File Prior to Visit  Medication Dose Route Frequency Provider Last Rate Last Admin  . betamethasone acetate-betamethasone sodium phosphate (CELESTONE) injection 3 mg  3 mg Intramuscular Once Edrick Kins, DPM        Social History   Tobacco Use  . Smoking status: Never Smoker  .  Smokeless tobacco: Never Used  Substance Use Topics  . Alcohol use: Yes    Comment: occasionally  . Drug use: No    Review of Systems  Constitutional: Negative for chills and fever.  HENT: Negative for trouble swallowing.   Respiratory: Negative for cough.   Cardiovascular: Negative for chest pain and palpitations.  Gastrointestinal: Negative for nausea and vomiting.      Objective:    BP 96/64 (BP Location: Left Arm, Patient Position: Sitting, Cuff Size: Normal)   Pulse 65   Temp 98.5 F (36.9 C) (Oral)   Resp 14   Ht 5\' 3"  (1.6 m)   Wt 129 lb 6.4 oz (58.7 kg)   SpO2 98%   BMI 22.92 kg/m  BP Readings from Last 3 Encounters:  10/28/19 96/64  04/11/18 122/78  03/31/18 108/60   Wt Readings from Last 3 Encounters:  10/28/19 129 lb 6.4 oz (58.7 kg)  01/09/19 152 lb 11.2 oz (69.3 kg)    04/11/18 152 lb 12.8 oz (69.3 kg)    Physical Exam Vitals reviewed.  Constitutional:      Appearance: She is well-developed.  Eyes:     Conjunctiva/sclera: Conjunctivae normal.  Neck:     Thyroid: No thyroid mass or thyromegaly.  Cardiovascular:     Rate and Rhythm: Normal rate and regular rhythm.     Pulses: Normal pulses.     Heart sounds: Normal heart sounds.  Pulmonary:     Effort: Pulmonary effort is normal.     Breath sounds: Normal breath sounds. No wheezing, rhonchi or rales.  Lymphadenopathy:     Head:     Right side of head: No submental, submandibular, tonsillar, preauricular, posterior auricular or occipital adenopathy.     Left side of head: No submental, submandibular, tonsillar, preauricular, posterior auricular or occipital adenopathy.     Cervical: No cervical adenopathy.  Skin:    General: Skin is warm and dry.  Neurological:     Mental Status: She is alert.  Psychiatric:        Speech: Speech normal.        Behavior: Behavior normal.        Thought Content: Thought content normal.        Assessment & Plan:   Problem List Items Addressed This Visit      Cardiovascular and Mediastinum   Essential hypertension, benign - Primary (Chronic)    Stable, continue regimen      Relevant Orders   TSH   CBC with Differential/Platelet   Comprehensive metabolic panel   Hemoglobin A1c   Lipid panel   VITAMIN D 25 Hydroxy (Vit-D Deficiency, Fractures)   B12 and Folate Panel     Endocrine   Diabetes type 2, controlled (HCC) (Chronic)    Expect very well controlled, continue metformin for now.  We did discuss de-escalating/discontinuing Metformin pending a1c      Hypothyroidism   Relevant Orders   TSH     Other   Anxiety and depression    Stable , continue lexapro          I have discontinued Navi M. Hollman's omega-3 acid ethyl esters. I am also having her maintain her multivitamin, Fish Oil, BIOTIN PO, aspirin, valACYclovir, escitalopram,  rosuvastatin, meclizine, lisinopril-hydrochlorothiazide, metFORMIN, and levothyroxine. We will continue to administer betamethasone acetate-betamethasone sodium phosphate.   No orders of the defined types were placed in this encounter.   Return precautions given.   Risks, benefits, and alternatives of the medications and  treatment plan prescribed today were discussed, and patient expressed understanding.   Education regarding symptom management and diagnosis given to patient on AVS.  Continue to follow with Burnard Hawthorne, FNP for routine health maintenance.   Ky Barban and I agreed with plan.   Mable Paris, FNP

## 2019-10-28 NOTE — Telephone Encounter (Signed)
CRITICAL VALUE STICKER  CRITICAL VALUE: Vitamin D: Greater than 120  RECEIVER (on-site recipient of call): Jari Favre, CMA  DATE & TIME NOTIFIED: 10/28/19 @ 4:56pm  MESSENGER (representative from lab): [facility]  MD NOTIFIED: Vidal Schwalbe, NP  TIME OF NOTIFICATION: 4:57pm  RESPONSE: see note

## 2019-10-28 NOTE — Assessment & Plan Note (Signed)
Stable, continue regimen. 

## 2019-10-28 NOTE — Patient Instructions (Addendum)
It is imperative that you are seen AT least twice per year for labs and monitoring. Monitor blood pressure at home and me 5-6 reading on separate days. Goal is less than 120/80, based on newest guidelines, however we certainly want to be less than 130/80;  if persistently higher, please make sooner follow up appointment so we can recheck you blood pressure and manage/ adjust medications.  Nice to see you!   

## 2019-11-14 ENCOUNTER — Other Ambulatory Visit: Payer: Self-pay | Admitting: Family

## 2019-11-14 DIAGNOSIS — E039 Hypothyroidism, unspecified: Secondary | ICD-10-CM

## 2019-11-18 ENCOUNTER — Other Ambulatory Visit: Payer: Self-pay | Admitting: Family

## 2019-11-19 ENCOUNTER — Ambulatory Visit
Admission: RE | Admit: 2019-11-19 | Discharge: 2019-11-19 | Disposition: A | Discharge status: Home / Self Care | Payer: BC Managed Care – PPO | Source: Ambulatory Visit | Attending: Family | Admitting: Family

## 2019-11-19 ENCOUNTER — Other Ambulatory Visit: Payer: Self-pay

## 2019-11-19 DIAGNOSIS — Z1231 Encounter for screening mammogram for malignant neoplasm of breast: Secondary | ICD-10-CM | POA: Diagnosis not present

## 2020-01-07 NOTE — Progress Notes (Signed)
PCP: Burnard Hawthorne, FNP   Chief Complaint  Patient presents with  . Gynecologic Exam    HPI:      Ms. Lindsay Mejia is a 58 y.o. G4P4 whose LMP was No LMP recorded. Patient has had a hysterectomy., presents today for her NP annual examination.  Her menses are absent due to TAH and 1 ovary removed, although doesn't know which one, due to [place].  She does not have PMB. She does not have vasomotor sx.   Sex activity: not sexually active. She does not have vaginal dryness.  Last Pap: not recent due to TAH; hx of abn pap in past with neg repeat. Hx of STDs: none  Last mammogram: 11/19/19  Results were: normal--routine follow-up in 12 months There is a FH of breast cancer in her mom and PGM, genetic testing not indicated. There is no FH of ovarian cancer. The patient does do self-breast exams. Has noticed a rash under LT breast recently. Thinks it's "ringworm". No meds to treat, not itchy.  Colonoscopy: 2019 with Dr. Vira Agar;  Repeat due after 5 years.   Tobacco use: The patient denies current or previous tobacco use. Alcohol use: none  No drug use Exercise: moderately active  She does get adequate calcium but not Vitamin D in her diet. Was taking supp but levels too high, so stopped them.  Labs with PCP.   Past Medical History:  Diagnosis Date  . Anxiety   . Diabetes mellitus without complication ([place])   . Hyperlipidemia   . Hypertension   . Hypothyroidism   . Rosacea    Dr. Lovell Sheehan  . Thyroid disease 2000   s/p XRT to thyroid, Dr. Sabra Heck at [facility]    Past Surgical History:  Procedure Laterality Date  . ABDOMINAL HYSTERECTOMY     has one ovary   . APPENDECTOMY    . BREAST BIOPSY Right 2006   neg  . BREAST SURGERY    . CESAREAN SECTION     4, Dr. Buford Dresser  . HERNIA REPAIR     abdominal  . KNEE ARTHROSCOPY W/ MENISCAL REPAIR     2    Family History  Problem Relation Age of Onset  . Hyperlipidemia Mother   . Hypertension Mother   . Breast cancer  Mother 41  . Colon polyps Mother   . Hypertension Father   . Heart disease Father   . Hypertension Brother   . Breast cancer Paternal Grandmother        later in life  . Colon cancer Neg Hx   . Ovarian cancer Neg Hx     Social History   Socioeconomic History  . Marital status: Married    Spouse name: Not on file  . Number of children: Not on file  . Years of education: Not on file  . Highest education level: Not on file  Occupational History  . Not on file  Tobacco Use  . Smoking status: Never Smoker  . Smokeless tobacco: Never Used  Substance and Sexual Activity  . Alcohol use: Yes    Comment: occasionally  . Drug use: No  . Sexual activity: Not Currently    Birth control/protection: Surgical    Comment: Hysterectomy  Other Topics Concern  . Not on file  Social History Narrative   Lives in [place]. Has 4 children. 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       Social Determinants of Health   Financial Resource Strain:   .  Difficulty of Paying Living Expenses: Not on file  Food Insecurity:   . Worried About Charity fundraiser in the Last Year: Not on file  . Ran Out of Food in the Last Year: Not on file  Transportation Needs:   . Lack of Transportation (Medical): Not on file  . Lack of Transportation (Non-Medical): Not on file  Physical Activity:   . Days of Exercise per Week: Not on file  . Minutes of Exercise per Session: Not on file  Stress:   . Feeling of Stress : Not on file  Social Connections:   . Frequency of Communication with Friends and Family: Not on file  . Frequency of Social Gatherings with Friends and Family: Not on file  . Attends Religious Services: Not on file  . Active Member of Clubs or Organizations: Not on file  . Attends Archivist Meetings: Not on file  . Marital Status: Not on file  Intimate Partner Violence:   . Fear of Current or Ex-Partner: Not on file  . Emotionally Abused: Not on file  . Physically Abused:  Not on file  . Sexually Abused: Not on file     Current Outpatient Medications:  .  aspirin 81 MG tablet, Take 1 tablet (81 mg total) by mouth daily., Disp: 30 tablet, Rfl: 6 .  BIOTIN PO, Take by mouth., Disp: , Rfl:  .  escitalopram (LEXAPRO) 10 MG tablet, TAKE 1 TABLET BY MOUTH DAILY, Disp: 90 tablet, Rfl: 1 .  levothyroxine (SYNTHROID) 75 MCG tablet, TAKE 1 TABLET(75 MCG) BY MOUTH DAILY ON AN EMPTY STOMACH, Disp: 90 tablet, Rfl: 0 .  lisinopril-hydrochlorothiazide (ZESTORETIC) 10-12.5 MG tablet, TAKE 1 TABLET BY MOUTH DAILY, Disp: 90 tablet, Rfl: 1 .  meclizine (ANTIVERT) 12.5 MG tablet, Take 1 tablet (12.5 mg total) by mouth 3 (three) times daily as needed for dizziness., Disp: 50 tablet, Rfl: 2 .  metFORMIN (GLUCOPHAGE) 500 MG tablet, TAKE 1 TABLET(500 MG) BY MOUTH TWICE DAILY WITH A MEAL, Disp: 180 tablet, Rfl: 1 .  Multiple Vitamin (MULTIVITAMIN) tablet, Take 1 tablet by mouth daily., Disp: , Rfl:  .  rosuvastatin (CRESTOR) 10 MG tablet, TAKE 1 TABLET(10 MG) BY MOUTH DAILY, Disp: 90 tablet, Rfl: 0 .  valACYclovir (VALTREX) 1000 MG tablet, TAKE 2 TABLETS(2000 MG) BY MOUTH EVERY 12 HOURS FOR 1 DAY. START: ASAP AFTER SYMPTOM ONSET, Disp: 30 tablet, Rfl: 1  Current Facility-Administered Medications:  .  betamethasone acetate-betamethasone sodium phosphate (CELESTONE) injection 3 mg, 3 mg, Intramuscular, Once, Evans, Brent M, DPM     ROS:  Review of Systems  Constitutional: Negative for fatigue, fever and unexpected weight change.  Respiratory: Negative for cough, shortness of breath and wheezing.   Cardiovascular: Negative for chest pain, palpitations and leg swelling.  Gastrointestinal: Negative for blood in stool, constipation, diarrhea, nausea and vomiting.  Endocrine: Negative for cold intolerance, heat intolerance and polyuria.  Genitourinary: Negative for dyspareunia, dysuria, flank pain, frequency, genital sores, hematuria, menstrual problem, pelvic pain, urgency, vaginal  bleeding, vaginal discharge and vaginal pain.  Musculoskeletal: Negative for back pain, joint swelling and myalgias.  Skin: Negative for rash.  Neurological: Negative for dizziness, syncope, light-headedness, numbness and headaches.  Hematological: Negative for adenopathy.  Psychiatric/Behavioral: Negative for agitation, confusion, sleep disturbance and suicidal ideas. The patient is not nervous/anxious.   BREAST: No symptoms    Objective: BP 130/80   Ht 5\' 3"  (1.6 m)   Wt 133 lb (60.3 kg)   BMI 23.56 kg/m  Physical Exam Constitutional:      Appearance: She is well-developed.  Genitourinary:     Vulva, vagina, right adnexa and left adnexa normal.     No vaginal discharge, erythema or tenderness.     Cervix is absent.     Uterus is absent.     No right or left adnexal mass present.     Right adnexa not tender.     Left adnexa not tender.     Genitourinary Comments: UTERUS/CX SURG REM  Neck:     Thyroid: No thyromegaly.  Cardiovascular:     Rate and Rhythm: Normal rate and regular rhythm.     Heart sounds: Normal heart sounds. No murmur heard.   Pulmonary:     Effort: Pulmonary effort is normal.     Breath sounds: Normal breath sounds.  Chest:     Breasts:        Right: No mass, nipple discharge, skin change or tenderness.        Left: No mass, nipple discharge, skin change or tenderness.  Abdominal:     Palpations: Abdomen is soft.     Tenderness: There is no abdominal tenderness. There is no guarding.  Musculoskeletal:        General: Normal range of motion.     Cervical back: Normal range of motion.  Neurological:     General: No focal deficit present.     Mental Status: She is alert and oriented to person, place, and time.     Cranial Nerves: No cranial nerve deficit.  Skin:    General: Skin is warm and dry.     Findings: Rash present. Rash is scaling.       Psychiatric:        Mood and Affect: Mood normal.        Behavior: Behavior normal.         Thought Content: Thought content normal.        Judgment: Judgment normal.  Vitals reviewed.     Assessment/Plan:  Encounter for annual routine gynecological examination  Cervical cancer screening - Plan: Cytology - PAP  Screening for HPV (human papillomavirus) - Plan: Cytology - PAP  Encounter for screening mammogram for malignant neoplasm of breast--pt current on mammo  Screening for colon cancer--done in 2019, repeat due 2024  Tinea corporis--treat with clotrimazole for 2-4 wks.        GYN counsel breast self exam, menopause, adequate intake of calcium and vitamin D, diet and exercise    F/U  Return in about 1 year (around 01/10/2021).  Kenlee Maler B. Lejla Moeser, PA-C 01/11/2020 8:36 AM

## 2020-01-08 ENCOUNTER — Other Ambulatory Visit: Payer: Self-pay | Admitting: Family

## 2020-01-08 DIAGNOSIS — E119 Type 2 diabetes mellitus without complications: Secondary | ICD-10-CM

## 2020-01-11 ENCOUNTER — Ambulatory Visit (INDEPENDENT_AMBULATORY_CARE_PROVIDER_SITE_OTHER): Payer: BC Managed Care – PPO | Admitting: Obstetrics and Gynecology

## 2020-01-11 ENCOUNTER — Other Ambulatory Visit (HOSPITAL_COMMUNITY)
Admission: RE | Admit: 2020-01-11 | Discharge: 2020-01-11 | Disposition: A | Discharge status: Home / Self Care | Payer: BC Managed Care – PPO | Source: Ambulatory Visit | Attending: Obstetrics and Gynecology | Admitting: Obstetrics and Gynecology

## 2020-01-11 ENCOUNTER — Encounter: Payer: Self-pay | Admitting: Obstetrics and Gynecology

## 2020-01-11 ENCOUNTER — Other Ambulatory Visit: Payer: Self-pay

## 2020-01-11 VITALS — BP 130/80 | Ht 63.0 in | Wt 133.0 lb

## 2020-01-11 DIAGNOSIS — Z1151 Encounter for screening for human papillomavirus (HPV): Secondary | ICD-10-CM | POA: Diagnosis not present

## 2020-01-11 DIAGNOSIS — Z124 Encounter for screening for malignant neoplasm of cervix: Secondary | ICD-10-CM | POA: Diagnosis not present

## 2020-01-11 DIAGNOSIS — Z1231 Encounter for screening mammogram for malignant neoplasm of breast: Secondary | ICD-10-CM | POA: Diagnosis not present

## 2020-01-11 DIAGNOSIS — B354 Tinea corporis: Secondary | ICD-10-CM

## 2020-01-11 DIAGNOSIS — Z01419 Encounter for gynecological examination (general) (routine) without abnormal findings: Secondary | ICD-10-CM

## 2020-01-11 DIAGNOSIS — Z1211 Encounter for screening for malignant neoplasm of colon: Secondary | ICD-10-CM

## 2020-01-11 NOTE — Patient Instructions (Signed)
I value your feedback and entrusting us with your care. If you get a [facility] patient survey, I would appreciate you taking the time to let us know about your experience today. Thank you!  As of March 26, 2019, your lab results will be released to your MyChart immediately, before I even have a chance to see them. Please give me time to review them and contact you if there are any abnormalities. Thank you for your patience.  

## 2020-01-12 ENCOUNTER — Encounter: Payer: Self-pay | Admitting: Obstetrics and Gynecology

## 2020-01-13 LAB — CYTOLOGY - PAP
Comment: NEGATIVE
Diagnosis: NEGATIVE
Diagnosis: REACTIVE
High risk HPV: NEGATIVE

## 2020-01-14 ENCOUNTER — Encounter: Payer: Self-pay | Admitting: Certified Nurse Midwife

## 2020-01-20 ENCOUNTER — Encounter: Payer: Self-pay | Admitting: Family

## 2020-01-21 NOTE — Telephone Encounter (Signed)
Called and spoke with the patient. She states that she is not experiencing any SOB and does not need to visit an UC. 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 She scheduled a video visit with Mable Paris on 01/26/2020. I have called the infusion center and left her information for an appointment.

## 2020-01-21 NOTE — Telephone Encounter (Signed)
Pt called and wanted to schedule an infusion

## 2020-01-22 DIAGNOSIS — U071 COVID-19: Secondary | ICD-10-CM | POA: Diagnosis not present

## 2020-01-26 ENCOUNTER — Telehealth: Payer: BC Managed Care – PPO | Admitting: Family

## 2020-02-04 ENCOUNTER — Encounter: Payer: Self-pay | Admitting: Obstetrics and Gynecology

## 2020-02-15 ENCOUNTER — Other Ambulatory Visit: Payer: Self-pay | Admitting: Family

## 2020-02-15 DIAGNOSIS — E039 Hypothyroidism, unspecified: Secondary | ICD-10-CM

## 2020-02-23 DIAGNOSIS — L300 Nummular dermatitis: Secondary | ICD-10-CM | POA: Diagnosis not present

## 2020-03-30 DIAGNOSIS — L300 Nummular dermatitis: Secondary | ICD-10-CM | POA: Diagnosis not present

## 2020-03-30 DIAGNOSIS — D2261 Melanocytic nevi of right upper limb, including shoulder: Secondary | ICD-10-CM | POA: Diagnosis not present

## 2020-03-30 DIAGNOSIS — D2271 Melanocytic nevi of right lower limb, including hip: Secondary | ICD-10-CM | POA: Diagnosis not present

## 2020-03-30 DIAGNOSIS — D2262 Melanocytic nevi of left upper limb, including shoulder: Secondary | ICD-10-CM | POA: Diagnosis not present

## 2020-04-19 ENCOUNTER — Other Ambulatory Visit: Payer: Self-pay | Admitting: Family

## 2020-04-29 ENCOUNTER — Ambulatory Visit: Payer: BC Managed Care – PPO | Admitting: Family

## 2020-05-11 ENCOUNTER — Ambulatory Visit: Payer: BC Managed Care – PPO | Admitting: Family

## 2020-05-19 ENCOUNTER — Other Ambulatory Visit: Payer: Self-pay | Admitting: Family

## 2020-05-19 DIAGNOSIS — E039 Hypothyroidism, unspecified: Secondary | ICD-10-CM

## 2020-06-06 ENCOUNTER — Ambulatory Visit: Payer: BC Managed Care – PPO | Admitting: Family

## 2020-06-10 ENCOUNTER — Encounter: Payer: Self-pay | Admitting: Family

## 2020-06-10 ENCOUNTER — Ambulatory Visit: Payer: BC Managed Care – PPO | Admitting: Family

## 2020-06-10 ENCOUNTER — Other Ambulatory Visit: Payer: Self-pay

## 2020-06-10 VITALS — BP 110/72 | HR 61 | Temp 97.5°F | Ht 63.0 in | Wt 137.2 lb

## 2020-06-10 DIAGNOSIS — E782 Mixed hyperlipidemia: Secondary | ICD-10-CM

## 2020-06-10 DIAGNOSIS — I10 Essential (primary) hypertension: Secondary | ICD-10-CM | POA: Diagnosis not present

## 2020-06-10 DIAGNOSIS — F419 Anxiety disorder, unspecified: Secondary | ICD-10-CM

## 2020-06-10 DIAGNOSIS — F32A Depression, unspecified: Secondary | ICD-10-CM

## 2020-06-10 DIAGNOSIS — E119 Type 2 diabetes mellitus without complications: Secondary | ICD-10-CM

## 2020-06-10 LAB — COMPREHENSIVE METABOLIC PANEL
ALT: 17 U/L (ref 0–35)
AST: 19 U/L (ref 0–37)
Albumin: 4.4 g/dL (ref 3.5–5.2)
Alkaline Phosphatase: 43 U/L (ref 39–117)
BUN: 24 mg/dL — ABNORMAL HIGH (ref 6–23)
CO2: 34 mEq/L — ABNORMAL HIGH (ref 19–32)
Calcium: 9.6 mg/dL (ref 8.4–10.5)
Chloride: 100 mEq/L (ref 96–112)
Creatinine, Ser: 0.87 mg/dL (ref 0.40–1.20)
GFR: 71.83 mL/min (ref >60.00)
Glucose, Bld: 88 mg/dL (ref 70–99)
Potassium: 3.6 mEq/L (ref 3.5–5.1)
Sodium: 141 mEq/L (ref 135–145)
Total Bilirubin: 0.7 mg/dL (ref 0.2–1.2)
Total Protein: 7 g/dL (ref 6.0–8.3)

## 2020-06-10 LAB — POCT GLYCOSYLATED HEMOGLOBIN (HGB A1C): Hemoglobin A1C: 5.1 % (ref 4.0–5.6)

## 2020-06-10 LAB — LIPID PANEL
Cholesterol: 139 mg/dL (ref 0–200)
HDL: 53.6 mg/dL (ref >39.00)
LDL Cholesterol: 70 mg/dL (ref 0–99)
NonHDL: 85.03
Total CHOL/HDL Ratio: 3
Triglycerides: 75 mg/dL (ref 0.0–149.0)
VLDL: 15 mg/dL (ref 0.0–40.0)

## 2020-06-10 LAB — MICROALBUMIN / CREATININE URINE RATIO
Creatinine,U: 167.1 mg/dL
Microalb Creat Ratio: 0.5 mg/g (ref 0.0–30.0)
Microalb, Ur: 0.8 mg/dL (ref 0.0–1.9)

## 2020-06-10 LAB — TSH: TSH: 2.85 u[IU]/mL (ref 0.35–4.50)

## 2020-06-10 LAB — VITAMIN D 25 HYDROXY (VIT D DEFICIENCY, FRACTURES): VITD: 66.09 ng/mL (ref 30.00–100.00)

## 2020-06-10 MED ORDER — OZEMPIC (0.25 OR 0.5 MG/DOSE) 2 MG/1.5ML ~~LOC~~ SOPN: 0.2500 mg | PEN_INJECTOR | SUBCUTANEOUS | 3 refills | Status: DC

## 2020-06-10 NOTE — Assessment & Plan Note (Addendum)
Patient is no longer prediabetic We agreed to trial stop metformin 500mg  BID. She start ozempic 0.25mg  to aid in weight loss. Will follow

## 2020-06-10 NOTE — Progress Notes (Signed)
Subjective:    Patient ID: Lindsay Mejia, female    DOB: 1961-01-30, 60 y.o.   MRN: [id number]  CC: Lindsay Mejia is a 60 y.o. female who presents today for follow up.   HPI: Frustrated by 5 pound weight gain. She is eating healthy and doesn't eat large portions. She has been on victoza in the past and would like to start this medication again. No personal or family h/o thyroid cancer.  Feels well on lexapro 10mg . Depression is controlled on this dose.   She is not taking cretsor 10mg  daily now  She is no longer on vitamin D supplements  Compliant with lisinopril- hctz 10-12.5mg . No cp, sob.   HISTORY:  Past Medical History:  Diagnosis Date  . Anxiety   . Diabetes mellitus without complication ([place])   . Hyperlipidemia   . Hypertension   . Hypothyroidism   . Rosacea    Dr. Lovell Sheehan  . Thyroid disease 2000   s/p XRT to thyroid, Dr. Sabra Heck at [place]   Past Surgical History:  Procedure Laterality Date  . ABDOMINAL HYSTERECTOMY     has one ovary   . APPENDECTOMY    . BREAST BIOPSY Right 2006   neg  . BREAST SURGERY    . CESAREAN SECTION     4, Dr. Buford Dresser  . HERNIA REPAIR     abdominal  . KNEE ARTHROSCOPY W/ MENISCAL REPAIR     2   Family History  Problem Relation Age of Onset  . Hyperlipidemia Mother   . Hypertension Mother   . Breast cancer Mother 59  . Colon polyps Mother   . Hypertension Father   . Heart disease Father   . Hypertension Brother   . Breast cancer Paternal Grandmother        later in life  . Colon cancer Neg Hx   . Ovarian cancer Neg Hx   . Thyroid cancer Neg Hx     Allergies: Patient has no active allergies. Current Outpatient Medications on File Prior to Visit  Medication Sig Dispense Refill  . BIOTIN PO Take by mouth.    . escitalopram (LEXAPRO) 10 MG tablet TAKE 1 TABLET BY MOUTH DAILY 90 tablet 1  . levothyroxine (SYNTHROID) 75 MCG tablet TAKE 1 TABLET(75 MCG) BY MOUTH DAILY ON AN EMPTY STOMACH 90 tablet 0  .  lisinopril-hydrochlorothiazide (ZESTORETIC) 10-12.5 MG tablet TAKE 1 TABLET BY MOUTH DAILY 90 tablet 1  . meclizine (ANTIVERT) 12.5 MG tablet Take 1 tablet (12.5 mg total) by mouth 3 (three) times daily as needed for dizziness. 50 tablet 2  . metFORMIN (GLUCOPHAGE) 500 MG tablet TAKE 1 TABLET(500 MG) BY MOUTH TWICE DAILY WITH A MEAL 180 tablet 1  . Multiple Vitamin (MULTIVITAMIN) tablet Take 1 tablet by mouth daily.    . rosuvastatin (CRESTOR) 10 MG tablet TAKE 1 TABLET(10 MG) BY MOUTH DAILY 90 tablet 1  . valACYclovir (VALTREX) 1000 MG tablet TAKE 2 TABLETS(2000 MG) BY MOUTH EVERY 12 HOURS FOR 1 DAY. START: ASAP AFTER SYMPTOM ONSET 30 tablet 1   Current Facility-Administered Medications on File Prior to Visit  Medication Dose Route Frequency Provider Last Rate Last Admin  . betamethasone acetate-betamethasone sodium phosphate (CELESTONE) injection 3 mg  3 mg Intramuscular Once Edrick Kins, DPM        Social History   Tobacco Use  . Smoking status: Never Smoker  . Smokeless tobacco: Never Used  Substance Use Topics  . Alcohol use: Yes  Comment: occasionally  . Drug use: No    Review of Systems  Constitutional: Negative for chills and fever.  Respiratory: Negative for cough.   Cardiovascular: Negative for chest pain and palpitations.  Gastrointestinal: Negative for nausea and vomiting.  Psychiatric/Behavioral: The patient is not nervous/anxious.       Objective:    BP 110/72   Pulse 61   Temp (!) 97.5 F (36.4 C)   Ht 5\' 3"  (1.6 m)   Wt 137 lb 3.2 oz (62.2 kg)   SpO2 99%   BMI 24.30 kg/m  BP Readings from Last 3 Encounters:  06/10/20 110/72  01/11/20 130/80  10/28/19 96/64   Wt Readings from Last 3 Encounters:  06/10/20 137 lb 3.2 oz (62.2 kg)  01/11/20 133 lb (60.3 kg)  10/28/19 129 lb 6.4 oz (58.7 kg)    Physical Exam Vitals reviewed.  Constitutional:      Appearance: She is well-developed and well-nourished.  Eyes:     Conjunctiva/sclera: Conjunctivae  normal.  Cardiovascular:     Rate and Rhythm: Normal rate and regular rhythm.     Pulses: Normal pulses.     Heart sounds: Normal heart sounds.  Pulmonary:     Effort: Pulmonary effort is normal.     Breath sounds: Normal breath sounds. No wheezing, rhonchi or rales.  Skin:    General: Skin is warm and dry.  Neurological:     Mental Status: She is alert.  Psychiatric:        Mood and Affect: Mood and affect normal.        Speech: Speech normal.        Behavior: Behavior normal.        Thought Content: Thought content normal.        Assessment & Plan:   Problem List Items Addressed This Visit      Cardiovascular and Mediastinum   Essential hypertension, benign (Chronic)    Controlled. Continue lisinopril- hctz 10-12.5mg .        Endocrine   Diabetes type 2, controlled ([place]) - Primary (Chronic)    Patient is no longer prediabetic We agreed to trial stop metformin 500mg  BID. She start ozempic 0.25mg  to aid in weight loss. Will follow      Relevant Medications   Semaglutide,0.25 or 0.5MG /DOS, (OZEMPIC, 0.25 OR 0.5 MG/DOSE,) 2 MG/1.5ML SOPN   Other Relevant Orders   TSH   Comprehensive metabolic panel   Lipid panel   VITAMIN D 25 Hydroxy (Vit-D Deficiency, Fractures)   Microalbumin / creatinine urine ratio   POCT HgB A1C (Completed)     Other   Anxiety and depression    Controlled. Continue lexapro 10mg       HLD (hyperlipidemia)    Anticipate at goal. Continue crestor 10mg           I have discontinued Lindsay Mejia's aspirin. I am also having her start on Ozempic (0.25 or 0.5 MG/DOSE). Additionally, I am having her maintain her multivitamin, BIOTIN PO, valACYclovir, meclizine, escitalopram, metFORMIN, rosuvastatin, lisinopril-hydrochlorothiazide, and levothyroxine. We will continue to administer betamethasone acetate-betamethasone sodium phosphate.   Meds ordered this encounter  Medications  . Semaglutide,0.25 or 0.5MG /DOS, (OZEMPIC, 0.25 OR 0.5 MG/DOSE,)  2 MG/1.5ML SOPN    Sig: Inject 0.25 mg into the skin once a week. After 4 weeks, Increase to 0.5mg  [place] qwk    Dispense:  3 mL    Refill:  3    Order Specific Question:   Supervising Provider    Answer:  TULLO, TERESA L [[id number]]    Return precautions given.   Risks, benefits, and alternatives of the medications and treatment plan prescribed today were discussed, and patient expressed understanding.   Education regarding symptom management and diagnosis given to patient on AVS.  Continue to follow with Burnard Hawthorne, FNP for routine health maintenance.   Ky Barban and I agreed with plan.   Mable Paris, FNP

## 2020-06-10 NOTE — Assessment & Plan Note (Signed)
Controlled. Continue lexapro 10mg 

## 2020-06-10 NOTE — Assessment & Plan Note (Signed)
Anticipate at goal. Continue crestor 10mg 

## 2020-06-10 NOTE — Patient Instructions (Signed)
Stop metformin Start ozempic  We have discussed starting non insulin daily injectable medication called Ozempic  which is a glucagon like peptide (GLP 1) agonist and works by delaying gastric emptying and increasing insulin secretion.It is given once per week. Most patients see significant weight loss with this drug class.   You may NOT take either medication if you or your family has history of thyroid, parathyroid, OR adrenal cancer. Please confirm you and your family does NOT have this history as this drug class has black box warning on this medication for that reason.   Advise to follow with directions on prescription and slowly increase from 0.25mg  [place] once per week ;stay here for 4 weeks. You may then increase to 0.5mg  [place] once per week and stay there for 4 weeks.     goal of no more than 1-2 lbs weight loss per week.   Semaglutide injection solution What is this medicine? SEMAGLUTIDE (Sem a GLOO tide) is used to improve blood sugar control in adults with type 2 diabetes. This medicine may be used with other diabetes medicines. This drug may also reduce the risk of heart attack or stroke if you have type 2 diabetes and risk factors for heart disease. This medicine may be used for other purposes; ask your health care provider or pharmacist if you have questions. COMMON BRAND NAME(S): OZEMPIC What should I tell my health care provider before I take this medicine? They need to know if you have any of these conditions:  endocrine tumors (MEN 2) or if someone in your family had these tumors  eye disease, vision problems  history of pancreatitis  kidney disease  stomach problems  thyroid cancer or if someone in your family had thyroid cancer  an unusual or allergic reaction to semaglutide, other medicines, foods, dyes, or preservatives  pregnant or trying to get pregnant  breast-feeding How should I use this medicine? This medicine is for injection under the skin of your upper  leg (thigh), stomach area, or upper arm. It is given once every week (every 7 days). You will be taught how to prepare and give this medicine. Use exactly as directed. Take your medicine at regular intervals. Do not take it more often than directed. If you use this medicine with insulin, you should inject this medicine and the insulin separately. Do not mix them together. Do not give the injections right next to each other. Change (rotate) injection sites with each injection. It is important that you put your used needles and syringes in a special sharps container. Do not put them in a trash can. If you do not have a sharps container, call your pharmacist or healthcare provider to get one. A special MedGuide will be given to you by the pharmacist with each prescription and refill. Be sure to read this information carefully each time. This drug comes with INSTRUCTIONS FOR USE. Ask your pharmacist for directions on how to use this drug. Read the information carefully. Talk to your pharmacist or health care provider if you have questions. Talk to your pediatrician regarding the use of this medicine in children. Special care may be needed. Overdosage: If you think you have taken too much of this medicine contact a poison control center or emergency room at once. NOTE: This medicine is only for you. Do not share this medicine with others. What if I miss a dose? If you miss a dose, take it as soon as you can within 5 days after the missed  dose. Then take your next dose at your regular weekly time. If it has been longer than 5 days after the missed dose, do not take the missed dose. Take the next dose at your regular time. Do not take double or extra doses. If you have questions about a missed dose, contact your health care provider for advice. What may interact with this medicine?  other medicines for diabetes Many medications may cause changes in blood sugar, these include:  alcohol containing  beverages  antiviral medicines for HIV or AIDS  aspirin and aspirin-like drugs  certain medicines for blood pressure, heart disease, irregular heart beat  chromium  diuretics  female hormones, such as estrogens or progestins, birth control pills  fenofibrate  gemfibrozil  isoniazid  lanreotide  female hormones or anabolic steroids  MAOIs like Carbex, Eldepryl, Marplan, Nardil, and Parnate  medicines for weight loss  medicines for allergies, asthma, cold, or cough  medicines for depression, anxiety, or psychotic disturbances  niacin  nicotine  NSAIDs, medicines for pain and inflammation, like ibuprofen or naproxen  octreotide  pasireotide  pentamidine  phenytoin  probenecid  quinolone antibiotics such as ciprofloxacin, levofloxacin, ofloxacin  some herbal dietary supplements  steroid medicines such as prednisone or cortisone  sulfamethoxazole; trimethoprim  thyroid hormones Some medications can hide the warning symptoms of low blood sugar (hypoglycemia). You may need to monitor your blood sugar more closely if you are taking one of these medications. These include:  beta-blockers, often used for high blood pressure or heart problems (examples include atenolol, metoprolol, propranolol)  clonidine  guanethidine  reserpine This list may not describe all possible interactions. Give your health care provider a list of all the medicines, herbs, non-prescription drugs, or dietary supplements you use. Also tell them if you smoke, drink alcohol, or use illegal drugs. Some items may interact with your medicine. What should I watch for while using this medicine? Visit your doctor or health care professional for regular checks on your progress. Drink plenty of fluids while taking this medicine. Check with your doctor or health care professional if you get an attack of severe diarrhea, nausea, and vomiting. The loss of too much body fluid can make it dangerous for  you to take this medicine. A test called the HbA1C (A1C) will be monitored. This is a simple blood test. It measures your blood sugar control over the last 2 to 3 months. You will receive this test every 3 to 6 months. Learn how to check your blood sugar. Learn the symptoms of low and high blood sugar and how to manage them. Always carry a quick-source of sugar with you in case you have symptoms of low blood sugar. Examples include hard sugar candy or glucose tablets. Make sure others know that you can choke if you eat or drink when you develop serious symptoms of low blood sugar, such as seizures or unconsciousness. They must get medical help at once. Tell your doctor or health care professional if you have high blood sugar. You might need to change the dose of your medicine. If you are sick or exercising more than usual, you might need to change the dose of your medicine. Do not skip meals. Ask your doctor or health care professional if you should avoid alcohol. Many nonprescription cough and cold products contain sugar or alcohol. These can affect blood sugar. Pens should never be shared. Even if the needle is changed, sharing may result in passing of viruses like hepatitis or HIV. Wear  a medical ID bracelet or chain, and carry a card that describes your disease and details of your medicine and dosage times. Do not become pregnant while taking this medicine. Women should inform their doctor if they wish to become pregnant or think they might be pregnant. There is a potential for serious side effects to an unborn child. Talk to your health care professional or pharmacist for more information. What side effects may I notice from receiving this medicine? Side effects that you should report to your doctor or health care professional as soon as possible:  allergic reactions like skin rash, itching or hives, swelling of the face, lips, or tongue  breathing problems  changes in vision  diarrhea that  continues or is severe  lump or swelling on the neck  severe nausea  signs and symptoms of infection like fever or chills; cough; sore throat; pain or trouble passing urine  signs and symptoms of low blood sugar such as feeling anxious, confusion, dizziness, increased hunger, unusually weak or tired, sweating, shakiness, cold, irritable, headache, blurred vision, fast heartbeat, loss of consciousness  signs and symptoms of kidney injury like trouble passing urine or change in the amount of urine  trouble swallowing  unusual stomach upset or pain  vomiting Side effects that usually do not require medical attention (report to your doctor or health care professional if they continue or are bothersome):  constipation  diarrhea  nausea  pain, redness, or irritation at site where injected  stomach upset This list may not describe all possible side effects. Call your doctor for medical advice about side effects. You may report side effects to FDA at 1-800-FDA-1088. Where should I keep my medicine? Keep out of the reach of children. Store unopened pens in a refrigerator between 2 and 8 degrees C (36 and 46 degrees F). Do not freeze. Protect from light and heat. After you first use the pen, it can be stored for 56 days at room temperature between 15 and 30 degrees C (59 and 86 degrees F) or in a refrigerator. Throw away your used pen after 56 days or after the expiration date, whichever comes first. Do not store your pen with the needle attached. If the needle is left on, medicine may leak from the pen. NOTE: This sheet is a summary. It may not cover all possible information. If you have questions about this medicine, talk to your doctor, pharmacist, or health care provider.  2021 Elsevier/Gold Standard (2018-12-16 09:41:51)

## 2020-06-10 NOTE — Assessment & Plan Note (Signed)
Controlled. Continue lisinopril- hctz 10-12.5mg .

## 2020-06-26 ENCOUNTER — Encounter: Payer: Self-pay | Admitting: Family

## 2020-06-26 DIAGNOSIS — E119 Type 2 diabetes mellitus without complications: Secondary | ICD-10-CM

## 2020-06-26 IMAGING — MG DIGITAL SCREENING BILATERAL MAMMOGRAM WITH TOMO AND CAD
8 series · 8 of 24 positions shown · non-contrast
Comparison: Previous exam(s).

CLINICAL DATA: Screening.

EXAM:
DIGITAL SCREENING BILATERAL MAMMOGRAM WITH TOMO AND CAD

[L MLO synth-2D]
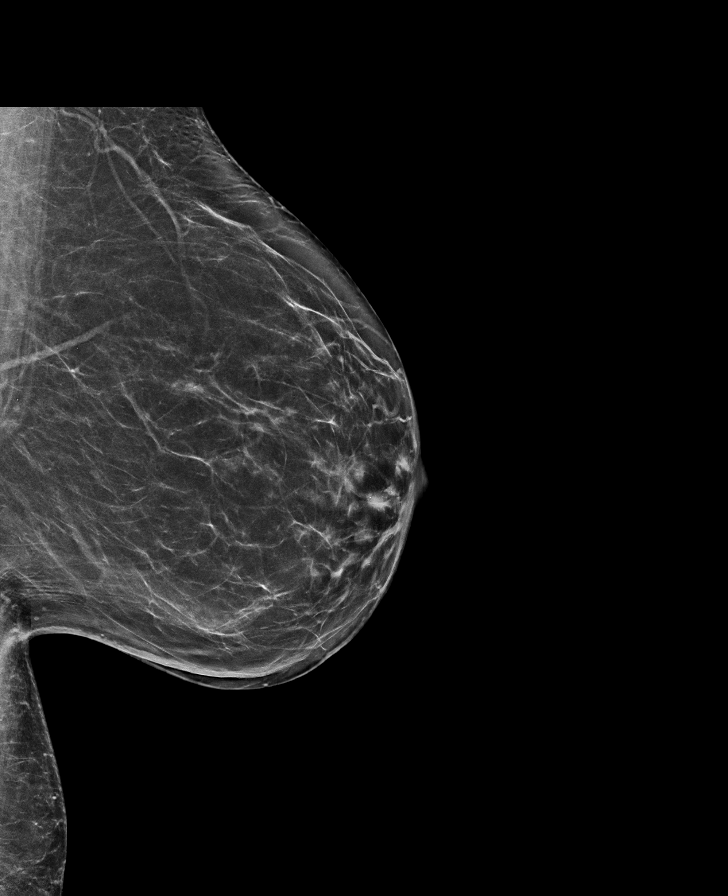

[L CC synth-2D]
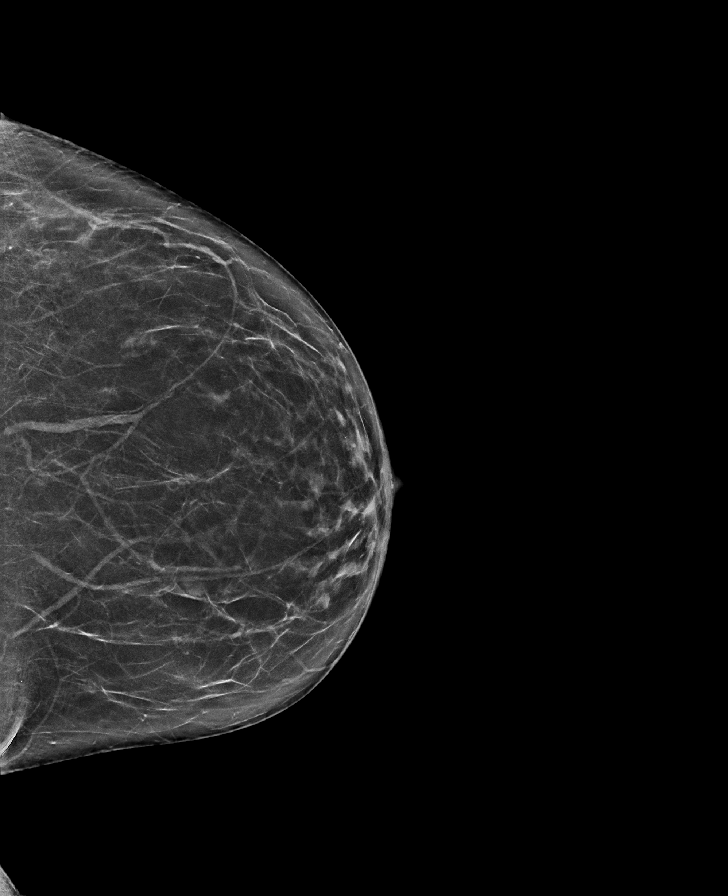

[R MLO synth-2D]
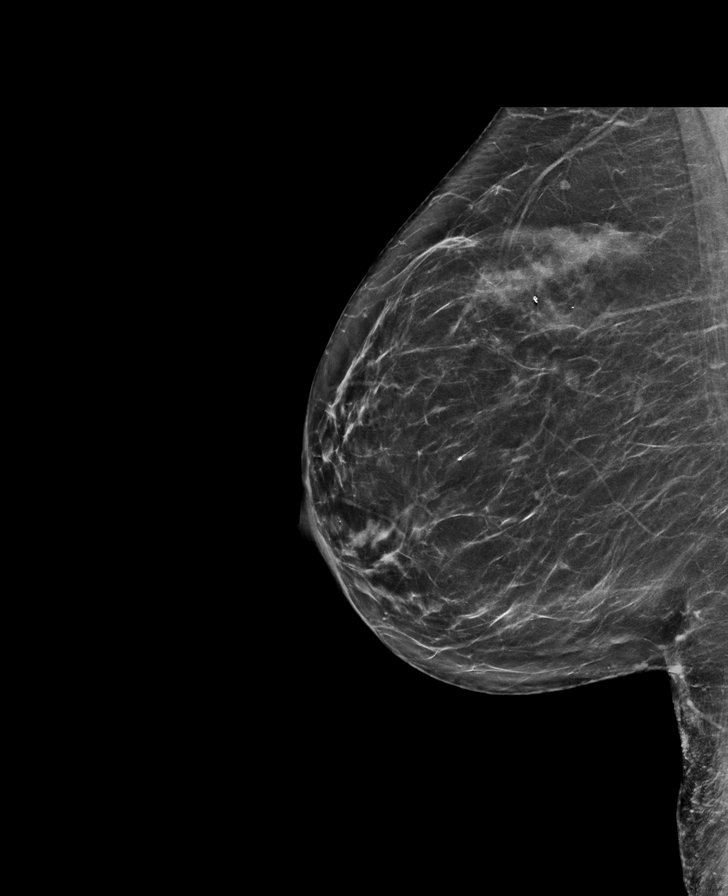

[R CC synth-2D]
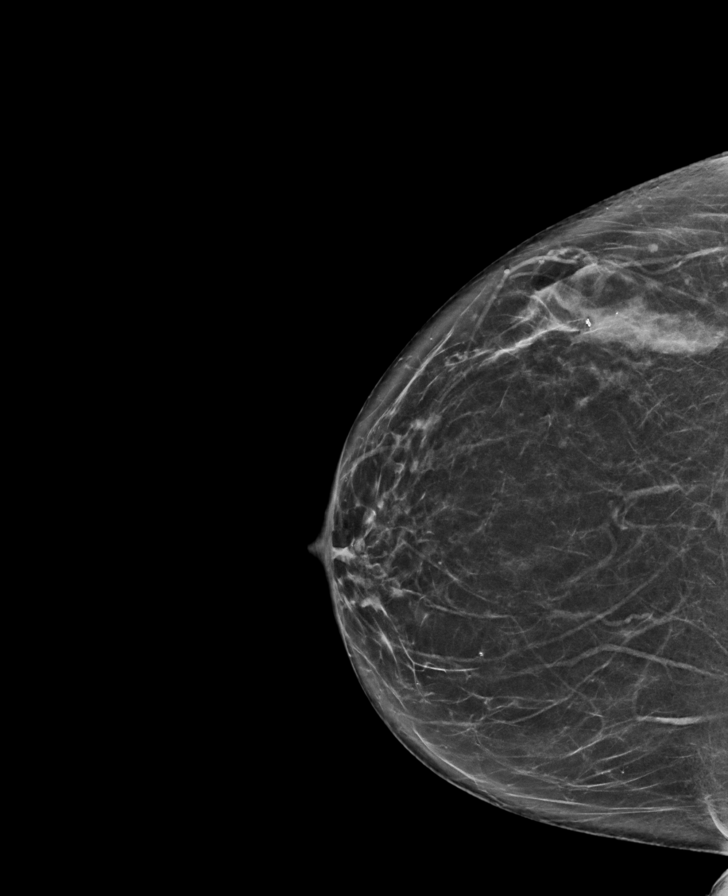

[R MLO tomo · tomo slice 37/73.0]
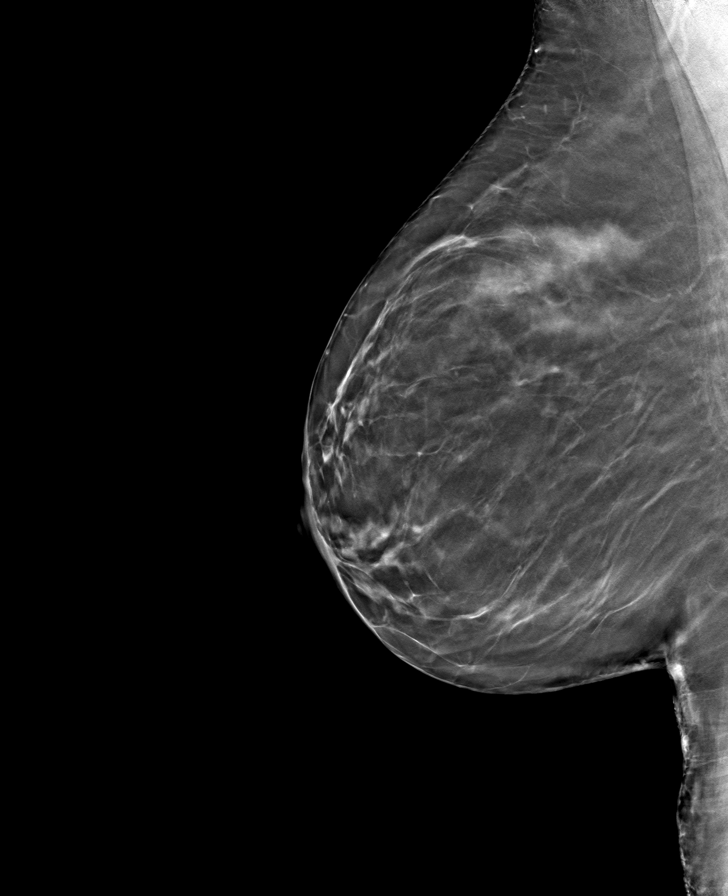

[R CC tomo · tomo slice 36/71.0]
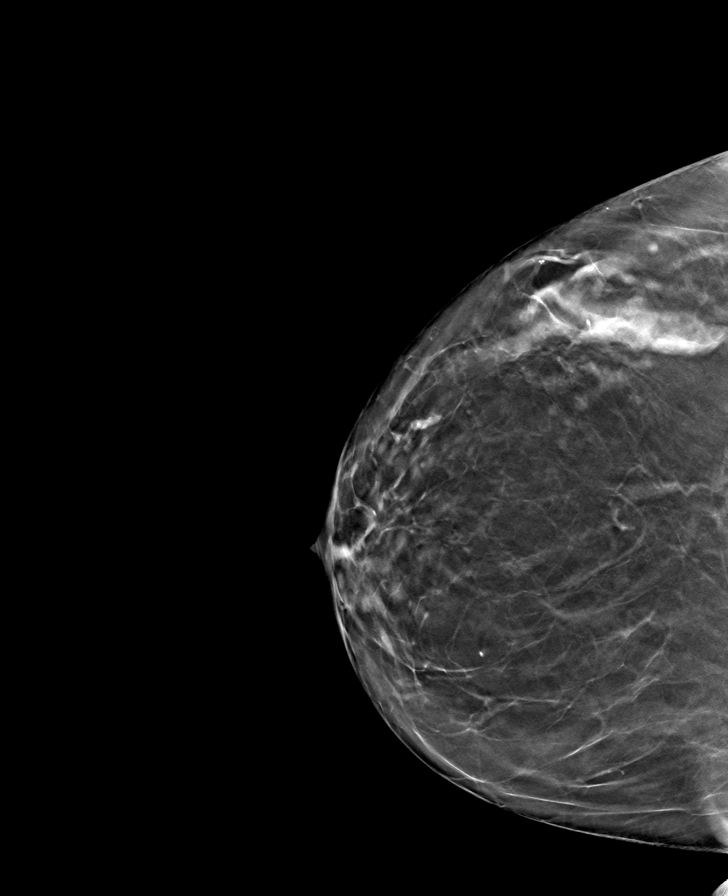

[L CC tomo · tomo slice 35/68.0]
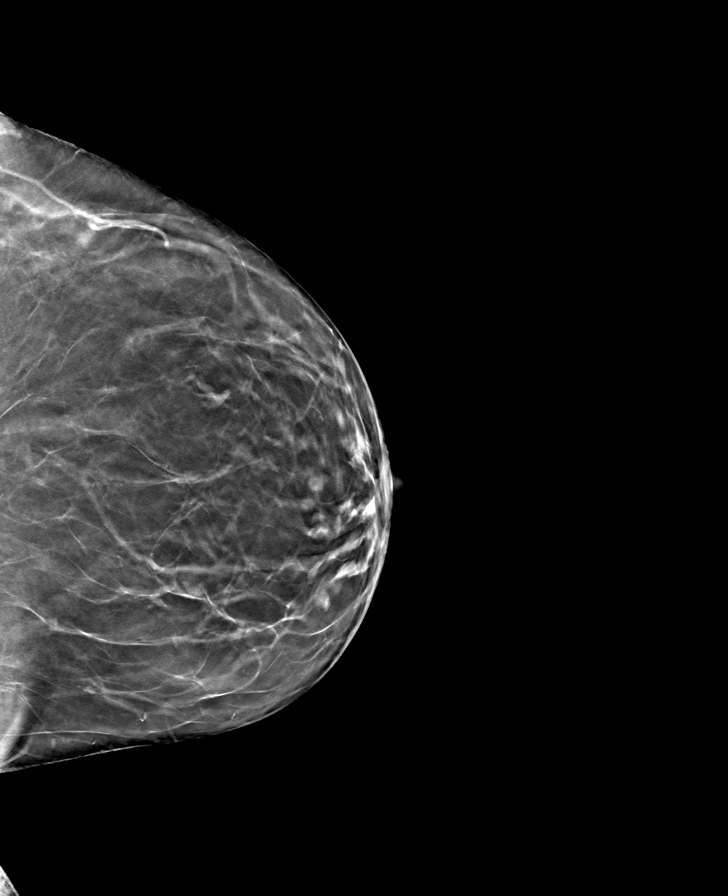

[L MLO tomo · tomo slice 37/72.0]
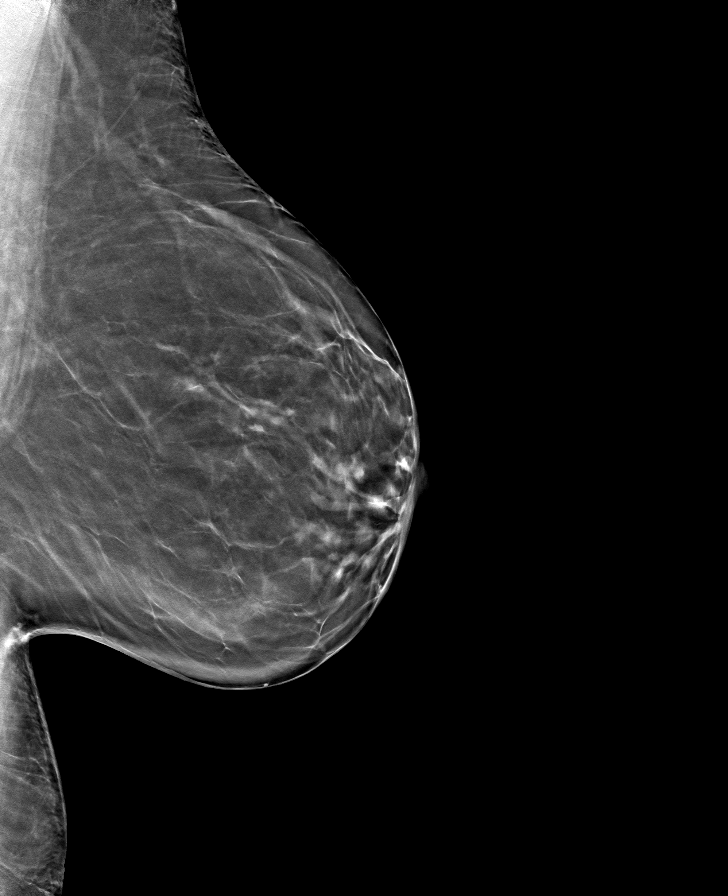

[8 of 24 positions shown; findings below may reference images not displayed]

ACR Breast Density Category b: There are scattered areas of
fibroglandular density.
FINDINGS: There are no findings suspicious for malignancy. Images were
processed with CAD.
IMPRESSION: No mammographic evidence of malignancy. A result letter of this
screening mammogram will be mailed directly to the patient.

RECOMMENDATION:
Screening mammogram in one year. (Code:[id number])

BI-RADS CATEGORY  1: Negative.

## 2020-06-27 MED ORDER — OZEMPIC (0.25 OR 0.5 MG/DOSE) 2 MG/1.5ML ~~LOC~~ SOPN: 0.2500 mg | PEN_INJECTOR | SUBCUTANEOUS | 1 refills | Status: DC

## 2020-07-05 DIAGNOSIS — H02409 Unspecified ptosis of unspecified eyelid: Secondary | ICD-10-CM | POA: Diagnosis not present

## 2020-07-09 ENCOUNTER — Other Ambulatory Visit: Payer: Self-pay | Admitting: Family

## 2020-07-18 ENCOUNTER — Other Ambulatory Visit: Payer: Self-pay | Admitting: Family

## 2020-07-18 DIAGNOSIS — E119 Type 2 diabetes mellitus without complications: Secondary | ICD-10-CM

## 2020-07-23 IMAGING — CT CT HEART SCORING
2 series · 16 of 20 positions shown, 18 images · non-contrast
Comparison: None.
COMPARISON: None.

Addendum:
EXAM:
OVER-READ INTERPRETATION  CT CHEST

The following report is an over-read performed by radiologist Dr.
Chung Wah Conceja [REDACTED] on 12/03/2018. This
over-read does not include interpretation of cardiac or coronary
anatomy or pathology. The coronary calcium score interpretation by
the cardiologist is attached.
CLINICAL DATA: Risk stratification
Coronary Calcium Score
TECHNIQUE: The patient was scanned on a Siemens Force scanner. Axial
non-contrast 3 mm slices were carried out through the heart. The
data set was analyzed on a dedicated work station and scored using
the Agatson method.

[Series 2: casc 3.0 i36f 2 bestdiast 74 % · axial · 0.33mm/px · z∈[-207,-96]mm · 8 of 49 slices shown, 10 images]
[im 6/49  vessel]
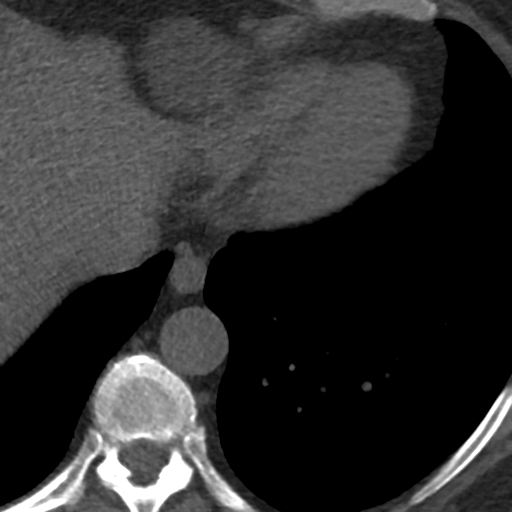
[im 6/49  lung]
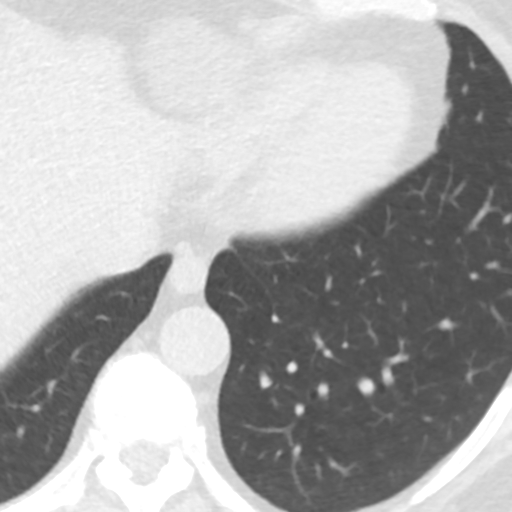
[im 11/49  vessel]
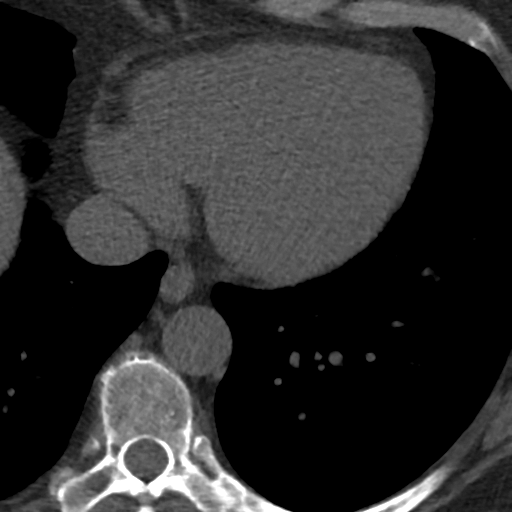
[im 17/49  vessel]
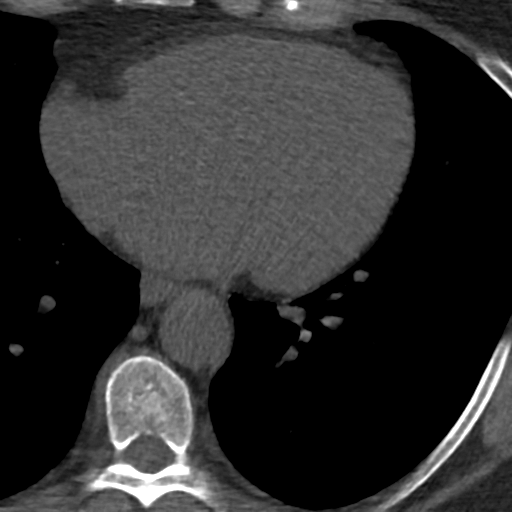
[im 22/49  vessel]
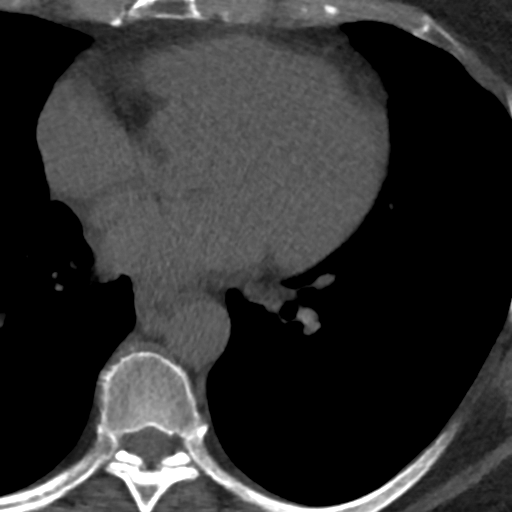
[im 27/49  vessel]
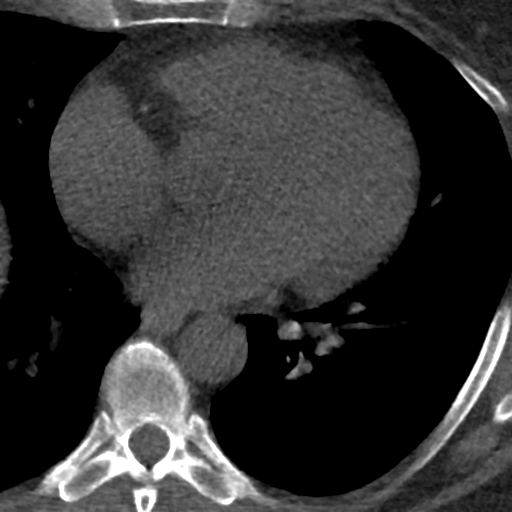
[im 27/49  lung]
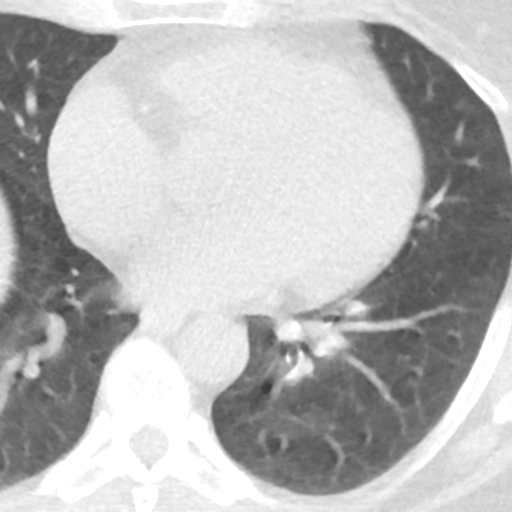
[im 33/49  vessel]
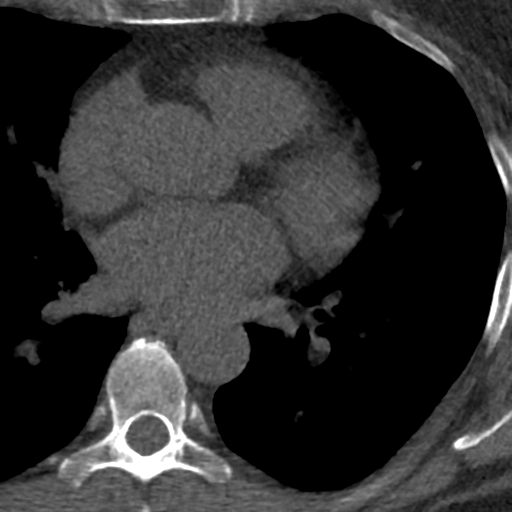
[im 38/49  vessel]
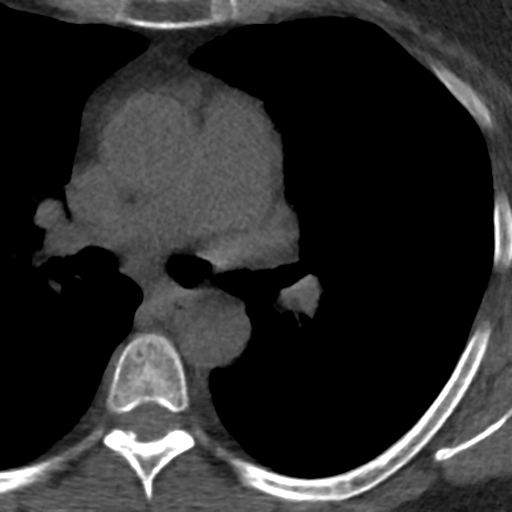
[im 43/49  vessel]
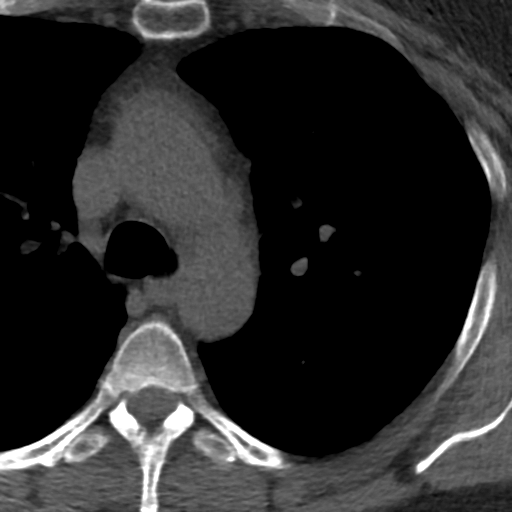

[Series 4: lung st 73 % · axial · 0.68mm/px · z∈[-212,-96]mm · 8 of 51 slices shown]
[im 6/51  lung]
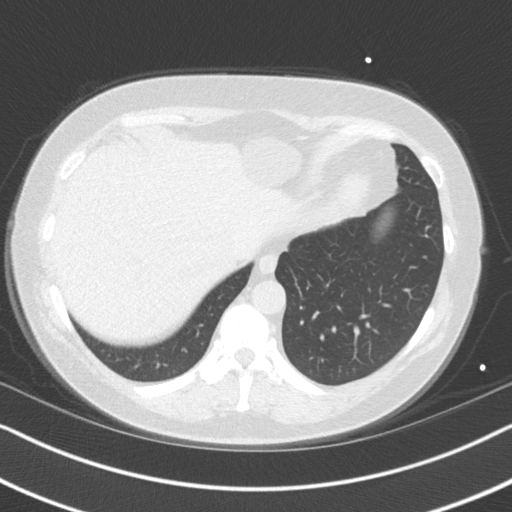
[im 12/51  lung]
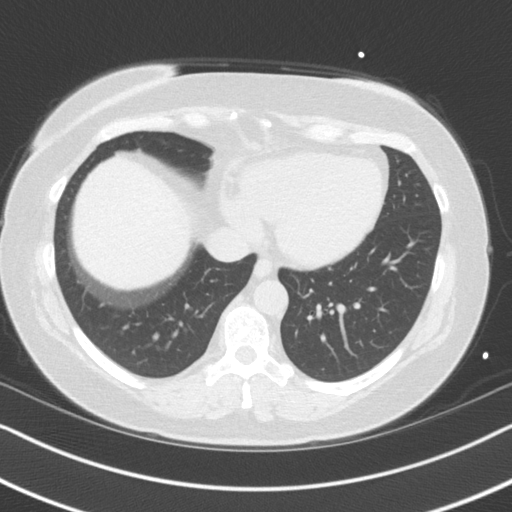
[im 17/51  lung]
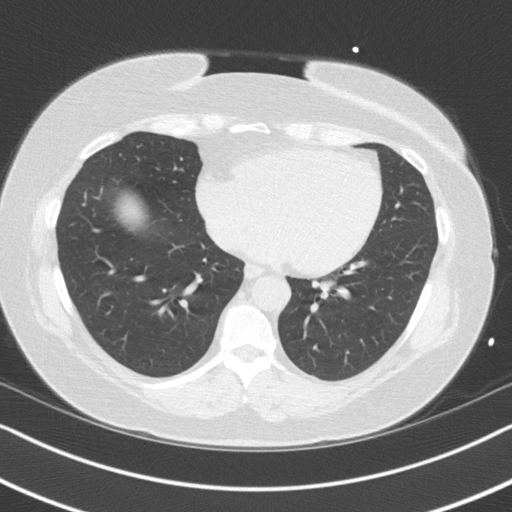
[im 23/51  lung]
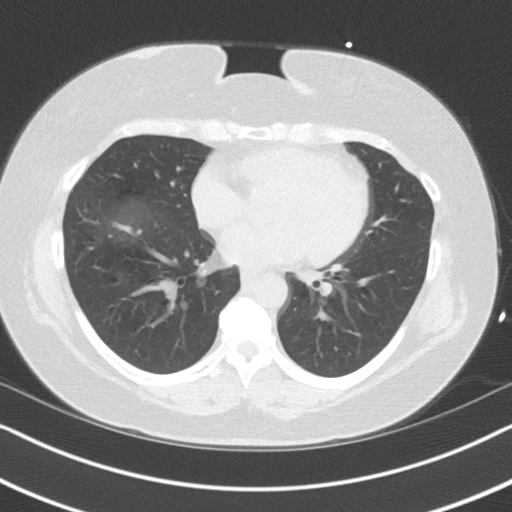
[im 28/51  lung]
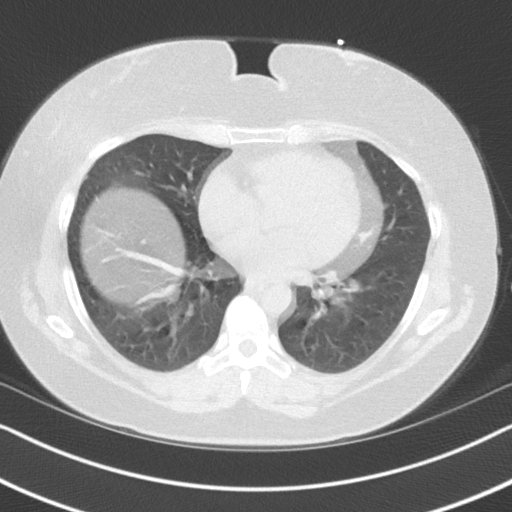
[im 34/51  lung]
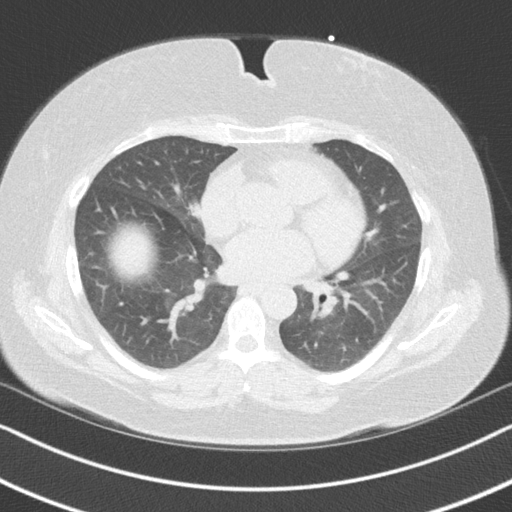
[im 39/51  lung]
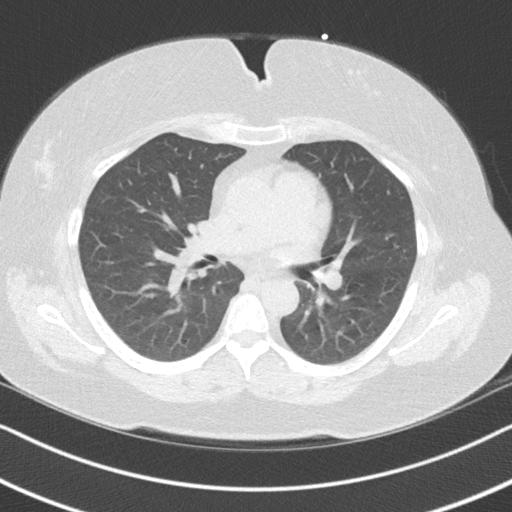
[im 45/51  lung]
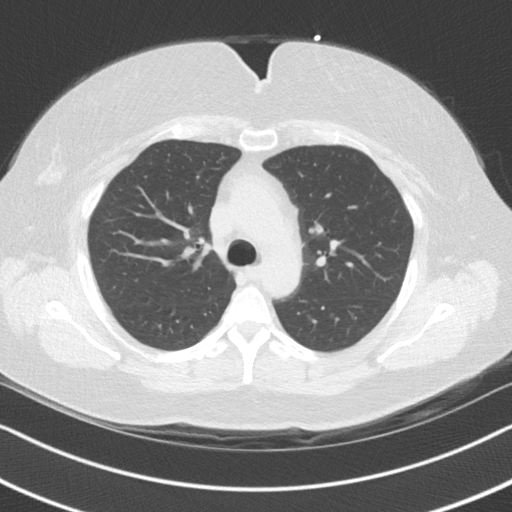

[16 of 20 positions shown; findings below may reference images not displayed]

FINDINGS: Vascular: No incidental vascular findings.

Mediastinum/Nodes: Visualized mediastinum and hilar regions
demonstrate no evidence of lymphadenopathy or mass.

Lungs/Pleura: Visualized lungs show no evidence of pulmonary edema,
consolidation, pneumothorax, nodule or pleural fluid.

Upper Abdomen: Well-circumscribed cystic structure is situated along
the superior subcapsular surface of the liver and immediately
inferior to the midline diaphragm, measuring approximately 3.6 x
cm and demonstrating simple cystic internal density. Findings are
consistent with a simple cyst.

Musculoskeletal: No chest wall mass or suspicious bone lesions
identified.
IMPRESSION: Simple cystic structure of the upper anterior abdomen just below the
diaphragm and abutting the superior capsule of the left lobe of the
liver. This appears benign.
FINDINGS: Non-cardiac: See separate report from [REDACTED].

Ascending Aorta: Normal size, no calcifications.

Pericardium: Normal.

Coronary arteries: Normal origin.
IMPRESSION: Coronary calcium score of 0. This was 0 percentile for age and sex
matched control.

*** End of Addendum ***
EXAM:
OVER-READ INTERPRETATION  CT CHEST

The following report is an over-read performed by radiologist Dr.
Chung Wah Conceja [REDACTED] on 12/03/2018. This
over-read does not include interpretation of cardiac or coronary
anatomy or pathology. The coronary calcium score interpretation by
the cardiologist is attached.
FINDINGS: Vascular: No incidental vascular findings.

Mediastinum/Nodes: Visualized mediastinum and hilar regions
demonstrate no evidence of lymphadenopathy or mass.

Lungs/Pleura: Visualized lungs show no evidence of pulmonary edema,
consolidation, pneumothorax, nodule or pleural fluid.

Upper Abdomen: Well-circumscribed cystic structure is situated along
the superior subcapsular surface of the liver and immediately
inferior to the midline diaphragm, measuring approximately 3.6 x
cm and demonstrating simple cystic internal density. Findings are
consistent with a simple cyst.

Musculoskeletal: No chest wall mass or suspicious bone lesions
identified.
IMPRESSION: Simple cystic structure of the upper anterior abdomen just below the
diaphragm and abutting the superior capsule of the left lobe of the
liver. This appears benign.

## 2020-07-25 ENCOUNTER — Other Ambulatory Visit: Payer: Self-pay

## 2020-07-25 ENCOUNTER — Encounter: Payer: Self-pay | Admitting: Family

## 2020-07-25 DIAGNOSIS — E039 Hypothyroidism, unspecified: Secondary | ICD-10-CM

## 2020-07-25 MED ORDER — LEVOTHYROXINE SODIUM 75 MCG PO TABS: ORAL_TABLET | ORAL | 0 refills | Status: DC

## 2020-10-10 ENCOUNTER — Other Ambulatory Visit: Payer: Self-pay | Admitting: Family

## 2020-10-10 DIAGNOSIS — Z1231 Encounter for screening mammogram for malignant neoplasm of breast: Secondary | ICD-10-CM

## 2020-10-19 ENCOUNTER — Other Ambulatory Visit: Payer: Self-pay | Admitting: Family

## 2020-10-19 DIAGNOSIS — E039 Hypothyroidism, unspecified: Secondary | ICD-10-CM

## 2020-11-22 ENCOUNTER — Other Ambulatory Visit: Payer: Self-pay

## 2020-11-22 ENCOUNTER — Ambulatory Visit
Admission: RE | Admit: 2020-11-22 | Discharge: 2020-11-22 | Disposition: A | Discharge status: Home / Self Care | Payer: 59 | Source: Ambulatory Visit | Attending: Family | Admitting: Family

## 2020-11-22 DIAGNOSIS — Z1231 Encounter for screening mammogram for malignant neoplasm of breast: Secondary | ICD-10-CM | POA: Insufficient documentation

## 2021-01-16 ENCOUNTER — Other Ambulatory Visit: Payer: Self-pay | Admitting: Family

## 2021-01-16 DIAGNOSIS — E039 Hypothyroidism, unspecified: Secondary | ICD-10-CM

## 2021-01-17 NOTE — Progress Notes (Signed)
PCP: Burnard Hawthorne, FNP   Chief Complaint  Patient presents with   Gynecologic Exam    No concerns    HPI:      Ms. Lindsay Mejia is a 60 y.o. G4P4 whose LMP was No LMP recorded. Patient has had a hysterectomy., presents today for her annual examination.  Her menses are absent due to TAH and 1 ovary removed, although doesn't know which one, due to [place].  She does not have PMB. She does not have vasomotor sx.   Sex activity: not sexually active. She does not have vaginal dryness.  Last Pap: 01/11/20 Results were normal/neg HPV DNA; no longer indicated due to TAH; hx of abn pap in past with neg repeat. Hx of STDs: none  Last mammogram: 11/22/20 Results were: normal--routine follow-up in 12 months There is a FH of breast cancer in her mom and PGM, genetic testing not indicated. There is no FH of ovarian cancer. The patient does do self-breast exams.   Colonoscopy: 2019 with Dr. Vira Agar;  Repeat due after 5 years.   Tobacco use: The patient denies current or previous tobacco use. Alcohol use: none  No drug use Exercise: moderately active  She does get adequate calcium and Vitamin D in her diet.  Labs with PCP.   Past Medical History:  Diagnosis Date   Anxiety    Diabetes mellitus without complication (HCC)    Hyperlipidemia    Hypertension    Hypothyroidism    Rosacea    Dr. Lovell Sheehan   Thyroid disease 2000   s/p XRT to thyroid, Dr. Sabra Heck at [place]    Past Surgical History:  Procedure Laterality Date   ABDOMINAL HYSTERECTOMY     has one ovary    APPENDECTOMY     BREAST BIOPSY Right 2006   neg   BREAST SURGERY     CESAREAN SECTION     4, Dr. Buford Dresser   HERNIA REPAIR     abdominal   KNEE ARTHROSCOPY W/ MENISCAL REPAIR     2    Family History  Problem Relation Age of Onset   Hyperlipidemia Mother    Hypertension Mother    Breast cancer Mother 53   Colon polyps Mother    Hypertension Father    Heart disease Father    Hypertension Brother     Breast cancer Paternal Grandmother        later in life   Colon cancer Neg Hx    Ovarian cancer Neg Hx    Thyroid cancer Neg Hx     Social History   Socioeconomic History   Marital status: Married    Spouse name: Not on file   Number of children: Not on file   Years of education: Not on file   Highest education level: Not on file  Occupational History   Not on file  Tobacco Use   Smoking status: Never   Smokeless tobacco: Never  Vaping Use   Vaping Use: Never used  Substance and Sexual Activity   Alcohol use: Yes    Comment: occasionally   Drug use: No   Sexual activity: Not Currently    Birth control/protection: Surgical    Comment: Hysterectomy  Other Topics Concern   Not on file  Social History Narrative   Lives in [place]. Has 4 children. 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       Social Determinants of Health   Financial Resource Strain: Not on file  Food Insecurity: Not on file  Transportation Needs: Not on file  Physical Activity: Not on file  Stress: Not on file  Social Connections: Not on file  Intimate Partner Violence: Not on file     Current Outpatient Medications:    escitalopram (LEXAPRO) 10 MG tablet, TAKE ONE TABLET BY MOUTH DAILY, Disp: 90 tablet, Rfl: 1   levothyroxine (SYNTHROID) 75 MCG tablet, TAKE ONE TABLET BY MOUTH DAILY ON AN EMPTY STOMACH, Disp: 90 tablet, Rfl: 0   lisinopril-hydrochlorothiazide (ZESTORETIC) 10-12.5 MG tablet, TAKE ONE TABLET BY MOUTH DAILY, Disp: 90 tablet, Rfl: 1   Multiple Vitamin (MULTIVITAMIN) tablet, Take 1 tablet by mouth daily., Disp: , Rfl:    rosuvastatin (CRESTOR) 10 MG tablet, TAKE 1 TABLET(10 MG) BY MOUTH DAILY, Disp: 90 tablet, Rfl: 1   Semaglutide,0.25 or 0.5MG /DOS, (OZEMPIC, 0.25 OR 0.5 MG/DOSE,) 2 MG/1.5ML SOPN, Inject 0.25 mg into the skin once a week. After 4 weeks, Increase to 0.5mg  [place] qwk, Disp: 6 mL, Rfl: 1   valACYclovir (VALTREX) 1000 MG tablet, TAKE 2 TABLETS(2000 MG) BY MOUTH EVERY 12  HOURS FOR 1 DAY. START: ASAP AFTER SYMPTOM ONSET, Disp: 30 tablet, Rfl: 1   metFORMIN (GLUCOPHAGE) 500 MG tablet, TAKE 1 TABLET(500 MG) BY MOUTH TWICE DAILY WITH A MEAL (Patient not taking: Reported on 01/18/2021), Disp: 180 tablet, Rfl: 1  Current Facility-Administered Medications:    betamethasone acetate-betamethasone sodium phosphate (CELESTONE) injection 3 mg, 3 mg, Intramuscular, Once, Evans, Brent M, DPM     ROS:  Review of Systems  Constitutional:  Negative for fatigue, fever and unexpected weight change.  Respiratory:  Negative for cough, shortness of breath and wheezing.   Cardiovascular:  Negative for chest pain, palpitations and leg swelling.  Gastrointestinal:  Negative for blood in stool, constipation, diarrhea, nausea and vomiting.  Endocrine: Negative for cold intolerance, heat intolerance and polyuria.  Genitourinary:  Negative for dyspareunia, dysuria, flank pain, frequency, genital sores, hematuria, menstrual problem, pelvic pain, urgency, vaginal bleeding, vaginal discharge and vaginal pain.  Musculoskeletal:  Negative for back pain, joint swelling and myalgias.  Skin:  Negative for rash.  Neurological:  Negative for dizziness, syncope, light-headedness, numbness and headaches.  Hematological:  Negative for adenopathy.  Psychiatric/Behavioral:  Negative for agitation, confusion, sleep disturbance and suicidal ideas. The patient is not nervous/anxious.  BREAST: No symptoms    Objective: BP 100/60   Ht 5\' 2"  (1.575 m)   Wt 134 lb (60.8 kg)   BMI 24.51 kg/m    Physical Exam Constitutional:      Appearance: She is well-developed.  Genitourinary:     Vulva normal.     Genitourinary Comments: UTERUS/CX SURG REM     Right Labia: No rash, tenderness or lesions.    Left Labia: No tenderness, lesions or rash.    Vaginal cuff intact.    No vaginal discharge, erythema or tenderness.     Mild vaginal atrophy present.     Right Adnexa: not tender and no mass  present.    Left Adnexa: not tender and no mass present.    Cervix is absent.     Uterus is absent.  Breasts:    Right: No mass, nipple discharge, skin change or tenderness.     Left: No mass, nipple discharge, skin change or tenderness.  Neck:     Thyroid: No thyromegaly.  Cardiovascular:     Rate and Rhythm: Normal rate and regular rhythm.     Heart sounds: Normal heart sounds. No murmur heard. Pulmonary:  Effort: Pulmonary effort is normal.     Breath sounds: Normal breath sounds.  Abdominal:     Palpations: Abdomen is soft.     Tenderness: There is no abdominal tenderness. There is no guarding.  Musculoskeletal:        General: Normal range of motion.     Cervical back: Normal range of motion.  Neurological:     General: No focal deficit present.     Mental Status: She is alert and oriented to person, place, and time.     Cranial Nerves: No cranial nerve deficit.  Skin:    General: Skin is warm and dry.  Psychiatric:        Mood and Affect: Mood normal.        Behavior: Behavior normal.        Thought Content: Thought content normal.        Judgment: Judgment normal.  Vitals reviewed.    Assessment/Plan: Encounter for annual routine gynecological examination  Encounter for screening mammogram for malignant neoplasm of breast; pt current on mammo.          GYN counsel breast self exam, menopause, adequate intake of calcium and vitamin D, diet and exercise    F/U  Return in about 1 year (around 01/18/2022).  Tysheka Fanguy B. Achilles Neville, PA-C 01/18/2021 10:22 AM

## 2021-01-18 ENCOUNTER — Encounter: Payer: Self-pay | Admitting: Obstetrics and Gynecology

## 2021-01-18 ENCOUNTER — Other Ambulatory Visit: Payer: Self-pay

## 2021-01-18 ENCOUNTER — Ambulatory Visit (INDEPENDENT_AMBULATORY_CARE_PROVIDER_SITE_OTHER): Payer: 59 | Admitting: Obstetrics and Gynecology

## 2021-01-18 VITALS — BP 100/60 | Ht 62.0 in | Wt 134.0 lb

## 2021-01-18 DIAGNOSIS — Z01419 Encounter for gynecological examination (general) (routine) without abnormal findings: Secondary | ICD-10-CM

## 2021-01-18 DIAGNOSIS — Z1231 Encounter for screening mammogram for malignant neoplasm of breast: Secondary | ICD-10-CM | POA: Diagnosis not present

## 2021-01-18 DIAGNOSIS — B354 Tinea corporis: Secondary | ICD-10-CM

## 2021-01-18 NOTE — Patient Instructions (Signed)
I value your feedback and you entrusting us with your care. If you get a  patient survey, I would appreciate you taking the time to let us know about your experience today. Thank you! ? ? ?

## 2021-03-29 ENCOUNTER — Ambulatory Visit: Payer: 59 | Admitting: Family

## 2021-03-31 ENCOUNTER — Ambulatory Visit (INDEPENDENT_AMBULATORY_CARE_PROVIDER_SITE_OTHER): Payer: 59 | Admitting: Family

## 2021-03-31 ENCOUNTER — Other Ambulatory Visit: Payer: Self-pay

## 2021-03-31 ENCOUNTER — Encounter: Payer: Self-pay | Admitting: Family

## 2021-03-31 VITALS — BP 118/76 | HR 74 | Ht 62.01 in | Wt 132.8 lb

## 2021-03-31 DIAGNOSIS — F419 Anxiety disorder, unspecified: Secondary | ICD-10-CM | POA: Diagnosis not present

## 2021-03-31 DIAGNOSIS — E039 Hypothyroidism, unspecified: Secondary | ICD-10-CM

## 2021-03-31 DIAGNOSIS — I10 Essential (primary) hypertension: Secondary | ICD-10-CM | POA: Diagnosis not present

## 2021-03-31 DIAGNOSIS — E119 Type 2 diabetes mellitus without complications: Secondary | ICD-10-CM

## 2021-03-31 DIAGNOSIS — E782 Mixed hyperlipidemia: Secondary | ICD-10-CM

## 2021-03-31 DIAGNOSIS — F32A Depression, unspecified: Secondary | ICD-10-CM

## 2021-03-31 LAB — COMPREHENSIVE METABOLIC PANEL
ALT: 21 U/L (ref 0–35)
AST: 23 U/L (ref 0–37)
Albumin: 4.3 g/dL (ref 3.5–5.2)
Alkaline Phosphatase: 52 U/L (ref 39–117)
BUN: 17 mg/dL (ref 6–23)
CO2: 31 mEq/L (ref 19–32)
Calcium: 9.5 mg/dL (ref 8.4–10.5)
Chloride: 103 mEq/L (ref 96–112)
Creatinine, Ser: 0.81 mg/dL (ref 0.40–1.20)
GFR: 77.82 mL/min (ref >60.00)
Glucose, Bld: 81 mg/dL (ref 70–99)
Potassium: 3.7 mEq/L (ref 3.5–5.1)
Sodium: 141 mEq/L (ref 135–145)
Total Bilirubin: 0.9 mg/dL (ref 0.2–1.2)
Total Protein: 7.3 g/dL (ref 6.0–8.3)

## 2021-03-31 LAB — LIPID PANEL
Cholesterol: 128 mg/dL (ref 0–200)
HDL: 55.1 mg/dL (ref >39.00)
LDL Cholesterol: 59 mg/dL (ref 0–99)
NonHDL: 72.56
Total CHOL/HDL Ratio: 2
Triglycerides: 67 mg/dL (ref 0.0–149.0)
VLDL: 13.4 mg/dL (ref 0.0–40.0)

## 2021-03-31 LAB — MICROALBUMIN / CREATININE URINE RATIO
Creatinine,U: 148.6 mg/dL
Microalb Creat Ratio: 1.1 mg/g (ref 0.0–30.0)
Microalb, Ur: 1.6 mg/dL (ref 0.0–1.9)

## 2021-03-31 LAB — CBC WITH DIFFERENTIAL/PLATELET
Basophils Absolute: 0 10*3/uL (ref 0.0–0.1)
Basophils Relative: 0.4 % (ref 0.0–3.0)
Eosinophils Absolute: 0.1 10*3/uL (ref 0.0–0.7)
Eosinophils Relative: 2.6 % (ref 0.0–5.0)
HCT: 40.9 % (ref 36.0–46.0)
Hemoglobin: 13.5 g/dL (ref 12.0–15.0)
Lymphocytes Relative: 35.8 % (ref 12.0–46.0)
Lymphs Abs: 1.9 10*3/uL (ref 0.7–4.0)
MCHC: 33.1 g/dL (ref 30.0–36.0)
MCV: 90.2 fl (ref 78.0–100.0)
Monocytes Absolute: 0.4 10*3/uL (ref 0.1–1.0)
Monocytes Relative: 8.2 % (ref 3.0–12.0)
Neutro Abs: 2.8 10*3/uL (ref 1.4–7.7)
Neutrophils Relative %: 53 % (ref 43.0–77.0)
Platelets: 257 10*3/uL (ref 150.0–400.0)
RBC: 4.53 Mil/uL (ref 3.87–5.11)
RDW: 13.3 % (ref 11.5–15.5)
WBC: 5.2 10*3/uL (ref 4.0–10.5)

## 2021-03-31 LAB — HEMOGLOBIN A1C: Hgb A1c MFr Bld: 5.4 % (ref 4.6–6.5)

## 2021-03-31 LAB — TSH: TSH: 0.5 u[IU]/mL (ref 0.35–5.50)

## 2021-03-31 MED ORDER — LEVOTHYROXINE SODIUM 75 MCG PO TABS: 75.0000 ug | ORAL_TABLET | ORAL | 2 refills | Status: DC

## 2021-03-31 MED ORDER — LISINOPRIL-HYDROCHLOROTHIAZIDE 10-12.5 MG PO TABS: 1.0000 | ORAL_TABLET | Freq: Every day | ORAL | 1 refills | Status: DC

## 2021-03-31 MED ORDER — ROSUVASTATIN CALCIUM 10 MG PO TABS: ORAL_TABLET | ORAL | 1 refills | Status: DC

## 2021-03-31 MED ORDER — OZEMPIC (1 MG/DOSE) 4 MG/3ML ~~LOC~~ SOPN: 1.0000 mg | PEN_INJECTOR | SUBCUTANEOUS | 1 refills | Status: DC

## 2021-03-31 MED ORDER — ESCITALOPRAM OXALATE 10 MG PO TABS: ORAL_TABLET | ORAL | 1 refills | Status: DC

## 2021-03-31 MED ORDER — LEVOTHYROXINE SODIUM 75 MCG PO TABS: ORAL_TABLET | ORAL | 0 refills | Status: DC

## 2021-03-31 NOTE — Assessment & Plan Note (Signed)
Chronic, stable.  Continue Lexapro 10 mg 

## 2021-03-31 NOTE — Assessment & Plan Note (Signed)
Excellent control, continue  lisinopril- hctz 10-12.5mg 

## 2021-03-31 NOTE — Assessment & Plan Note (Signed)
Anticipate well controlled.  Pending A1c.  Patient interested in increasing Ozempic, new prescription sent for Ozempic 1 mg.  She will remain off of metformin

## 2021-03-31 NOTE — Progress Notes (Signed)
Subjective:    Patient ID: Lindsay Mejia, female    DOB: [birth year], 60 y.o.   MRN: [id number]  CC: Lindsay Mejia is a 60 y.o. female who presents today for follow up.   HPI: She feels well today.  No new complaints    DM-we did a trial stop of metformin.  She is compliant with Ozempic 0.5 mg and is pleased with medication.  She is interested in increasing. she is tolerating well Ozempic HTN- compliant with  lisinopril- hctz 10-12.5mg .No CP.   HLD-compliant with Crestor 10 mg  Anxiety depression-compliant with Lexapro 10 mg  Declines tdap, influenza vaccine  HISTORY:  Past Medical History:  Diagnosis Date   Anxiety    Diabetes mellitus without complication (HCC)    Hyperlipidemia    Hypertension    Hypothyroidism    Rosacea    Dr. Lovell Sheehan   Thyroid disease 2000   s/p XRT to thyroid, Dr. Sabra Heck at [facility]   Past Surgical History:  Procedure Laterality Date   ABDOMINAL HYSTERECTOMY     has one ovary    APPENDECTOMY     BREAST BIOPSY Right 2006   neg   BREAST SURGERY     CESAREAN SECTION     4, Dr. Buford Dresser   HERNIA REPAIR     abdominal   KNEE ARTHROSCOPY W/ MENISCAL REPAIR     2   Family History  Problem Relation Age of Onset   Hyperlipidemia Mother    Hypertension Mother    Breast cancer Mother 95   Colon polyps Mother    Hypertension Father    Heart disease Father    Hypertension Brother    Breast cancer Paternal Grandmother        later in life   Colon cancer Neg Hx    Ovarian cancer Neg Hx    Thyroid cancer Neg Hx     Allergies: Patient has no active allergies. Current Outpatient Medications on File Prior to Visit  Medication Sig Dispense Refill   Multiple Vitamin (MULTIVITAMIN) tablet Take 1 tablet by mouth daily.     valACYclovir (VALTREX) 1000 MG tablet TAKE 2 TABLETS(2000 MG) BY MOUTH EVERY 12 HOURS FOR 1 DAY. START: ASAP AFTER SYMPTOM ONSET 30 tablet 1   Current Facility-Administered Medications on File Prior to Visit  Medication  Dose Route Frequency Provider Last Rate Last Admin   betamethasone acetate-betamethasone sodium phosphate (CELESTONE) injection 3 mg  3 mg Intramuscular Once Edrick Kins, DPM        Social History   Tobacco Use   Smoking status: Never   Smokeless tobacco: Never  Vaping Use   Vaping Use: Never used  Substance Use Topics   Alcohol use: Yes    Comment: occasionally   Drug use: No    Review of Systems  Constitutional:  Negative for chills and fever.  Respiratory:  Negative for cough.   Cardiovascular:  Negative for chest pain and palpitations.  Gastrointestinal:  Negative for nausea and vomiting.     Objective:    BP 118/76    Pulse 74    Ht 5' 2.01" (1.575 m)    Wt 132 lb 12.8 oz (60.2 kg)    SpO2 97%    BMI 24.28 kg/m  BP Readings from Last 3 Encounters:  03/31/21 118/76  01/18/21 100/60  06/10/20 110/72   Wt Readings from Last 3 Encounters:  03/31/21 132 lb 12.8 oz (60.2 kg)  01/18/21 134 lb (60.8 kg)  06/10/20  137 lb 3.2 oz (62.2 kg)    Physical Exam Vitals reviewed.  Constitutional:      Appearance: She is well-developed.  Eyes:     Conjunctiva/sclera: Conjunctivae normal.  Cardiovascular:     Rate and Rhythm: Normal rate and regular rhythm.     Pulses: Normal pulses.     Heart sounds: Normal heart sounds.  Pulmonary:     Effort: Pulmonary effort is normal.     Breath sounds: Normal breath sounds. No wheezing, rhonchi or rales.  Skin:    General: Skin is warm and dry.  Neurological:     Mental Status: She is alert.  Psychiatric:        Speech: Speech normal.        Behavior: Behavior normal.        Thought Content: Thought content normal.       Assessment & Plan:   Problem List Items Addressed This Visit       Cardiovascular and Mediastinum   Essential hypertension, benign - Primary (Chronic)    Excellent control, continue  lisinopril- hctz 10-12.5mg       Relevant Medications   lisinopril-hydrochlorothiazide (ZESTORETIC) 10-12.5 MG tablet    rosuvastatin (CRESTOR) 10 MG tablet     Endocrine   Diabetes type 2, controlled (HCC) (Chronic)    Anticipate well controlled.  Pending A1c.  Patient interested in increasing Ozempic, new prescription sent for Ozempic 1 mg.  She will remain off of metformin      Relevant Medications   lisinopril-hydrochlorothiazide (ZESTORETIC) 10-12.5 MG tablet   rosuvastatin (CRESTOR) 10 MG tablet   Semaglutide, 1 MG/DOSE, (OZEMPIC, 1 MG/DOSE,) 4 MG/3ML SOPN   Other Relevant Orders   CBC with Differential/Platelet   Comprehensive metabolic panel   Hemoglobin A1c   Microalbumin / creatinine urine ratio   Lipid panel   Hypothyroidism   Relevant Medications   levothyroxine (SYNTHROID) 75 MCG tablet   Other Relevant Orders   TSH     Other   Anxiety and depression    Chronic, stable.  Continue Lexapro 10 mg      Relevant Medications   escitalopram (LEXAPRO) 10 MG tablet   HLD (hyperlipidemia)    Chronic, stable.  Continue Crestor 10 mg      Relevant Medications   lisinopril-hydrochlorothiazide (ZESTORETIC) 10-12.5 MG tablet   rosuvastatin (CRESTOR) 10 MG tablet     I have discontinued Kyara M. Wingrove's Ozempic (0.25 or 0.5 MG/DOSE), metFORMIN, and levothyroxine. I have also changed her lisinopril-hydrochlorothiazide and levothyroxine. Additionally, I am having her start on Ozempic (1 MG/DOSE). Lastly, I am having her maintain her multivitamin, valACYclovir, escitalopram, and rosuvastatin. We will continue to administer betamethasone acetate-betamethasone sodium phosphate.   Meds ordered this encounter  Medications   escitalopram (LEXAPRO) 10 MG tablet    Sig: TAKE ONE TABLET BY MOUTH DAILY    Dispense:  90 tablet    Refill:  1   DISCONTD: levothyroxine (SYNTHROID) 75 MCG tablet    Sig: TAKE ONE TABLET BY MOUTH DAILY ON AN EMPTY STOMACH  Strength: 75 mcg    Dispense:  90 tablet    Refill:  0   lisinopril-hydrochlorothiazide (ZESTORETIC) 10-12.5 MG tablet    Sig: Take 1 tablet by  mouth daily.    Dispense:  90 tablet    Refill:  1   rosuvastatin (CRESTOR) 10 MG tablet    Sig: TAKE 1 TABLET(10 MG) BY MOUTH DAILY    Dispense:  90 tablet    Refill:  1   Semaglutide, 1 MG/DOSE, (OZEMPIC, 1 MG/DOSE,) 4 MG/3ML SOPN    Sig: Inject 1 mg into the skin once a week.    Dispense:  9 mL    Refill:  1    Order Specific Question:   Supervising Provider    Answer:   Crecencio Mc [[id number]]   levothyroxine (SYNTHROID) 75 MCG tablet    Sig: Take 1 tablet (75 mcg total) by mouth every morning.    Dispense:  90 tablet    Refill:  2    Order Specific Question:   Supervising Provider    Answer:   Crecencio Mc [[id number]]    Return precautions given.   Risks, benefits, and alternatives of the medications and treatment plan prescribed today were discussed, and patient expressed understanding.   Education regarding symptom management and diagnosis given to patient on AVS.  Continue to follow with Burnard Hawthorne, FNP for routine health maintenance.   Ky Barban and I agreed with plan.   Mable Paris, FNP

## 2021-03-31 NOTE — Assessment & Plan Note (Signed)
Chronic, stable.  Continue Crestor 10 mg 

## 2021-05-29 ENCOUNTER — Encounter: Payer: Self-pay | Admitting: Family

## 2021-05-31 ENCOUNTER — Other Ambulatory Visit: Payer: Self-pay | Admitting: Family

## 2021-05-31 DIAGNOSIS — E119 Type 2 diabetes mellitus without complications: Secondary | ICD-10-CM

## 2021-05-31 MED ORDER — TRULICITY 0.75 MG/0.5ML ~~LOC~~ SOAJ: 0.7500 mg | SUBCUTANEOUS | 2 refills | Status: DC

## 2021-06-07 ENCOUNTER — Telehealth: Payer: Self-pay

## 2021-06-07 NOTE — Telephone Encounter (Signed)
Close  

## 2021-07-05 DIAGNOSIS — L821 Other seborrheic keratosis: Secondary | ICD-10-CM | POA: Diagnosis not present

## 2021-07-05 DIAGNOSIS — R208 Other disturbances of skin sensation: Secondary | ICD-10-CM | POA: Diagnosis not present

## 2021-07-05 DIAGNOSIS — L814 Other melanin hyperpigmentation: Secondary | ICD-10-CM | POA: Diagnosis not present

## 2021-07-05 DIAGNOSIS — D485 Neoplasm of uncertain behavior of skin: Secondary | ICD-10-CM | POA: Diagnosis not present

## 2021-07-05 DIAGNOSIS — D2261 Melanocytic nevi of right upper limb, including shoulder: Secondary | ICD-10-CM | POA: Diagnosis not present

## 2021-07-05 DIAGNOSIS — D2262 Melanocytic nevi of left upper limb, including shoulder: Secondary | ICD-10-CM | POA: Diagnosis not present

## 2021-07-05 DIAGNOSIS — D2271 Melanocytic nevi of right lower limb, including hip: Secondary | ICD-10-CM | POA: Diagnosis not present

## 2021-07-05 DIAGNOSIS — Z85828 Personal history of other malignant neoplasm of skin: Secondary | ICD-10-CM | POA: Diagnosis not present

## 2021-07-27 DIAGNOSIS — D0362 Melanoma in situ of left upper limb, including shoulder: Secondary | ICD-10-CM | POA: Diagnosis not present

## 2021-08-01 ENCOUNTER — Encounter: Payer: Self-pay | Admitting: Family

## 2021-08-04 ENCOUNTER — Other Ambulatory Visit: Payer: Self-pay | Admitting: Family

## 2021-08-04 DIAGNOSIS — E663 Overweight: Secondary | ICD-10-CM

## 2021-08-04 MED ORDER — TRULICITY 1.5 MG/0.5ML ~~LOC~~ SOAJ: 1.5000 mg | SUBCUTANEOUS | 3 refills | Status: DC

## 2021-08-29 NOTE — Telephone Encounter (Signed)
[place] office to inform patient that she can go on website to get coupon for Trulicity ?

## 2021-08-30 ENCOUNTER — Telehealth: Payer: Self-pay

## 2021-08-30 NOTE — Telephone Encounter (Signed)
NOTED

## 2021-08-30 NOTE — Telephone Encounter (Signed)
Patient called back and I relayed message from Jenate Martinique, [place]. ?

## 2021-09-27 ENCOUNTER — Other Ambulatory Visit: Payer: Self-pay | Admitting: Family

## 2021-09-27 DIAGNOSIS — E119 Type 2 diabetes mellitus without complications: Secondary | ICD-10-CM

## 2021-09-27 DIAGNOSIS — I10 Essential (primary) hypertension: Secondary | ICD-10-CM

## 2021-09-27 DIAGNOSIS — F32A Depression, unspecified: Secondary | ICD-10-CM

## 2021-10-01 ENCOUNTER — Encounter: Payer: Self-pay | Admitting: Family

## 2021-10-05 ENCOUNTER — Other Ambulatory Visit: Payer: Self-pay

## 2021-10-05 DIAGNOSIS — E119 Type 2 diabetes mellitus without complications: Secondary | ICD-10-CM

## 2021-10-05 MED ORDER — SEMAGLUTIDE(0.25 OR 0.5MG/DOS) 2 MG/3ML ~~LOC~~ SOPN: PEN_INJECTOR | SUBCUTANEOUS | 1 refills | Status: DC

## 2021-10-11 ENCOUNTER — Other Ambulatory Visit: Payer: Self-pay

## 2021-10-11 DIAGNOSIS — E119 Type 2 diabetes mellitus without complications: Secondary | ICD-10-CM

## 2021-10-11 MED ORDER — SEMAGLUTIDE(0.25 OR 0.5MG/DOS) 2 MG/3ML ~~LOC~~ SOPN: PEN_INJECTOR | SUBCUTANEOUS | 1 refills | Status: DC

## 2021-10-19 ENCOUNTER — Other Ambulatory Visit: Payer: Self-pay | Admitting: Family

## 2021-10-19 DIAGNOSIS — Z1231 Encounter for screening mammogram for malignant neoplasm of breast: Secondary | ICD-10-CM

## 2021-11-01 ENCOUNTER — Ambulatory Visit: Payer: Self-pay | Admitting: Family

## 2021-11-24 ENCOUNTER — Other Ambulatory Visit: Payer: Self-pay | Admitting: Family

## 2021-11-24 DIAGNOSIS — E663 Overweight: Secondary | ICD-10-CM

## 2021-12-04 ENCOUNTER — Ambulatory Visit
Admission: RE | Admit: 2021-12-04 | Discharge: 2021-12-04 | Disposition: A | Discharge status: Home / Self Care | Payer: 59 | Source: Ambulatory Visit | Attending: Family | Admitting: Family

## 2021-12-04 DIAGNOSIS — Z1231 Encounter for screening mammogram for malignant neoplasm of breast: Secondary | ICD-10-CM | POA: Insufficient documentation

## 2021-12-12 ENCOUNTER — Encounter: Payer: Self-pay | Admitting: Family

## 2021-12-12 ENCOUNTER — Ambulatory Visit: Payer: 59 | Admitting: Family

## 2021-12-12 VITALS — BP 124/72 | HR 70 | Temp 98.2°F | Ht 62.0 in | Wt 133.6 lb

## 2021-12-12 DIAGNOSIS — I10 Essential (primary) hypertension: Secondary | ICD-10-CM | POA: Diagnosis not present

## 2021-12-12 DIAGNOSIS — E119 Type 2 diabetes mellitus without complications: Secondary | ICD-10-CM

## 2021-12-12 DIAGNOSIS — C439 Malignant melanoma of skin, unspecified: Secondary | ICD-10-CM | POA: Diagnosis not present

## 2021-12-12 LAB — BASIC METABOLIC PANEL
BUN: 21 mg/dL (ref 6–23)
CO2: 31 mEq/L (ref 19–32)
Calcium: 9.6 mg/dL (ref 8.4–10.5)
Chloride: 102 mEq/L (ref 96–112)
Creatinine, Ser: 0.8 mg/dL (ref 0.40–1.20)
GFR: 78.6 mL/min (ref >60.00)
Glucose, Bld: 75 mg/dL (ref 70–99)
Potassium: 4.2 mEq/L (ref 3.5–5.1)
Sodium: 142 mEq/L (ref 135–145)

## 2021-12-12 LAB — HEMOGLOBIN A1C: Hgb A1c MFr Bld: 5.7 % (ref 4.6–6.5)

## 2021-12-12 MED ORDER — TRULICITY 3 MG/0.5ML ~~LOC~~ SOAJ: 3.0000 mg | SUBCUTANEOUS | 2 refills | Status: DC

## 2021-12-12 NOTE — Progress Notes (Signed)
Subjective:    Patient ID: Lindsay Mejia, female    DOB: 1960/07/05, 61 y.o.   MRN: [id number]  CC: Lindsay Mejia is a 61 y.o. female who presents today for follow up.   HPI: Feels well today.  No new complaints     HTN- compliant with lisinopril- hctz 10-12.'5mg'$   She had previously been on ozempic '1mg'$  however cost has increased. She was pleased with weight maintenance and decreased appetite.  She tried trulicity and didn't find benefit on 0.75 dose.  Previously been on metformin.  She has never tried Wellbutrin.  No history of anorexia and bulimia, heavy alcohol use or seizure    HISTORY:  Past Medical History:  Diagnosis Date   Anxiety    Diabetes mellitus without complication ([place])    Hyperlipidemia    Hypertension    Hypothyroidism    Rosacea    Dr. Lovell Sheehan   Thyroid disease 2000   s/p XRT to thyroid, Dr. Sabra Heck at [place]   Past Surgical History:  Procedure Laterality Date   ABDOMINAL HYSTERECTOMY     has one ovary    APPENDECTOMY     BREAST BIOPSY Right 2006   neg   BREAST SURGERY     CESAREAN SECTION     4, Dr. Buford Dresser   HERNIA REPAIR     abdominal   KNEE ARTHROSCOPY W/ MENISCAL REPAIR     2   Family History  Problem Relation Age of Onset   Hyperlipidemia Mother    Hypertension Mother    Breast cancer Mother 2   Colon polyps Mother    Hypertension Father    Heart disease Father    Hypertension Brother    Breast cancer Paternal Grandmother        later in life   Colon cancer Neg Hx    Ovarian cancer Neg Hx    Thyroid cancer Neg Hx     Allergies: Patient has no active allergies. Current Outpatient Medications on File Prior to Visit  Medication Sig Dispense Refill   escitalopram (LEXAPRO) 10 MG tablet TAKE ONE TABLET BY MOUTH DAILY 30 tablet 3   levothyroxine (SYNTHROID) 75 MCG tablet Take 1 tablet (75 mcg total) by mouth every morning. 90 tablet 2   lisinopril-hydrochlorothiazide (ZESTORETIC) 10-12.5 MG tablet TAKE ONE TABLET BY MOUTH  DAILY 30 tablet 3   Multiple Vitamin (MULTIVITAMIN) tablet Take 1 tablet by mouth daily.     rosuvastatin (CRESTOR) 10 MG tablet TAKE ONE TABLET BY MOUTH DAILY 30 tablet 3   valACYclovir (VALTREX) 1000 MG tablet TAKE 2 TABLETS(2000 MG) BY MOUTH EVERY 12 HOURS FOR 1 DAY. START: ASAP AFTER SYMPTOM ONSET 30 tablet 1   Current Facility-Administered Medications on File Prior to Visit  Medication Dose Route Frequency Provider Last Rate Last Admin   betamethasone acetate-betamethasone sodium phosphate (CELESTONE) injection 3 mg  3 mg Intramuscular Once Edrick Kins, DPM        Social History   Tobacco Use   Smoking status: Never   Smokeless tobacco: Never  Vaping Use   Vaping Use: Never used  Substance Use Topics   Alcohol use: Yes    Comment: occasionally   Drug use: No    Review of Systems  Constitutional:  Negative for chills and fever.  Respiratory:  Negative for cough.   Cardiovascular:  Negative for chest pain and palpitations.  Gastrointestinal:  Negative for nausea and vomiting.      Objective:    BP 124/72 (  BP Location: Left Arm, Patient Position: Sitting, Cuff Size: Normal)   Pulse 70   Temp 98.2 F (36.8 C) (Oral)   Ht '5\' 2"'$  (1.575 m)   Wt 133 lb 9.6 oz (60.6 kg)   SpO2 99%   BMI 24.44 kg/m  BP Readings from Last 3 Encounters:  12/12/21 124/72  03/31/21 118/76  01/18/21 100/60   Wt Readings from Last 3 Encounters:  12/12/21 133 lb 9.6 oz (60.6 kg)  03/31/21 132 lb 12.8 oz (60.2 kg)  01/18/21 134 lb (60.8 kg)    Physical Exam Vitals reviewed.  Constitutional:      Appearance: She is well-developed.  Eyes:     Conjunctiva/sclera: Conjunctivae normal.  Cardiovascular:     Rate and Rhythm: Normal rate and regular rhythm.     Pulses: Normal pulses.     Heart sounds: Normal heart sounds.  Pulmonary:     Effort: Pulmonary effort is normal.     Breath sounds: Normal breath sounds. No wheezing, rhonchi or rales.  Skin:    General: Skin is warm and dry.   Neurological:     Mental Status: She is alert.  Psychiatric:        Speech: Speech normal.        Behavior: Behavior normal.        Thought Content: Thought content normal.        Assessment & Plan:   Problem List Items Addressed This Visit       Cardiovascular and Mediastinum   Essential hypertension, benign (Chronic)    Excellent control, continue lisinopril hydrochlorothiazide 10-12.5 mg        Endocrine   Diabetes type 2, controlled ([place]) - Primary (Chronic)    Anticipate well-controlled has been for the past couple of years.  Patient prefers to remain on GLP 1 for weight maintenance, diabetic control.  Increase Trulicity to 3 mg dose.  We also discussed trial of metformin or Wellbutrin.  Patient has no history of anorexia or bulimia, heavy alcohol use or seizure.  She will also inquire about cost of Victoza as another option      Relevant Medications   Dulaglutide (TRULICITY) 3 XV/4.0GQ SOPN   Other Relevant Orders   Hemoglobin Q7Y   Basic metabolic panel     Musculoskeletal and Integument   Melanoma of skin ([place])     I have discontinued Lindy M. Carras's Semaglutide(0.25 or 0.'5MG'$ /DOS). I am also having her start on Trulicity. Additionally, I am having her maintain her multivitamin, valACYclovir, levothyroxine, rosuvastatin, lisinopril-hydrochlorothiazide, and escitalopram. We will continue to administer betamethasone acetate-betamethasone sodium phosphate.   Meds ordered this encounter  Medications   Dulaglutide (TRULICITY) 3 PP/5.0DT SOPN    Sig: Inject 3 mg as directed once a week.    Dispense:  2 mL    Refill:  2    Order Specific Question:   Supervising Provider    Answer:   Crecencio Mc [[id number]]    Return precautions given.   Risks, benefits, and alternatives of the medications and treatment plan prescribed today were discussed, and patient expressed understanding.   Education regarding symptom management and diagnosis given to patient on  AVS.  Continue to follow with Burnard Hawthorne, FNP for routine health maintenance.   Ky Barban and I agreed with plan.   Mable Paris, FNP

## 2021-12-12 NOTE — Progress Notes (Signed)
Patient stated that she will schedule eye exam

## 2021-12-12 NOTE — Assessment & Plan Note (Signed)
Anticipate well-controlled has been for the past couple of years.  Patient prefers to remain on GLP 1 for weight maintenance, diabetic control.  Increase Trulicity to 3 mg dose.  We also discussed trial of metformin or Wellbutrin.  Patient has no history of anorexia or bulimia, heavy alcohol use or seizure.  She will also inquire about cost of Victoza as another option

## 2021-12-12 NOTE — Patient Instructions (Addendum)
Ask pharmacist about Victoza  Increase trulicity to '3mg'$  weekly  Nice to see you

## 2021-12-12 NOTE — Assessment & Plan Note (Signed)
Excellent control, continue lisinopril hydrochlorothiazide 10-12.5 mg 

## 2022-01-06 ENCOUNTER — Other Ambulatory Visit: Payer: Self-pay | Admitting: Family

## 2022-01-06 DIAGNOSIS — E039 Hypothyroidism, unspecified: Secondary | ICD-10-CM

## 2022-01-10 DIAGNOSIS — Z85820 Personal history of malignant melanoma of skin: Secondary | ICD-10-CM | POA: Diagnosis not present

## 2022-01-10 DIAGNOSIS — L814 Other melanin hyperpigmentation: Secondary | ICD-10-CM | POA: Diagnosis not present

## 2022-01-10 DIAGNOSIS — D2272 Melanocytic nevi of left lower limb, including hip: Secondary | ICD-10-CM | POA: Diagnosis not present

## 2022-01-10 DIAGNOSIS — L570 Actinic keratosis: Secondary | ICD-10-CM | POA: Diagnosis not present

## 2022-01-10 DIAGNOSIS — Z86006 Personal history of melanoma in-situ: Secondary | ICD-10-CM | POA: Diagnosis not present

## 2022-01-10 DIAGNOSIS — Z85828 Personal history of other malignant neoplasm of skin: Secondary | ICD-10-CM | POA: Diagnosis not present

## 2022-01-30 ENCOUNTER — Other Ambulatory Visit: Payer: Self-pay | Admitting: Family

## 2022-01-30 DIAGNOSIS — I10 Essential (primary) hypertension: Secondary | ICD-10-CM

## 2022-01-30 DIAGNOSIS — E119 Type 2 diabetes mellitus without complications: Secondary | ICD-10-CM

## 2022-01-30 DIAGNOSIS — F32A Depression, unspecified: Secondary | ICD-10-CM

## 2022-03-08 ENCOUNTER — Other Ambulatory Visit: Payer: Self-pay | Admitting: Family

## 2022-03-08 DIAGNOSIS — E119 Type 2 diabetes mellitus without complications: Secondary | ICD-10-CM

## 2022-04-19 ENCOUNTER — Ambulatory Visit: Payer: 59 | Admitting: Obstetrics and Gynecology

## 2022-04-20 NOTE — Progress Notes (Unsigned)
PCP: Burnard Hawthorne, FNP   No chief complaint on file.   HPI:      Ms. Lindsay Mejia is a 62 y.o. G4P4 whose LMP was No LMP recorded. Patient has had a hysterectomy., presents today for her annual examination.  Her menses are absent due to TAH and 1 ovary removed, although doesn't know which one, due to [place].  She does not have PMB. She does not have vasomotor sx.   Sex activity: not sexually active. She does not have vaginal dryness.  Last Pap: 01/11/20 Results were normal/neg HPV DNA; no longer indicated due to TAH; hx of abn pap in past with neg repeat. Hx of STDs: none  Last mammogram: 11/14/21 with PCP; Results were: normal--routine follow-up in 12 months There is a FH of breast cancer in her mom and PGM, genetic testing not indicated. There is no FH of ovarian cancer. The patient does do self-breast exams.   Colonoscopy: 2019 with Dr. Vira Agar;  Repeat due after 5 years.   Tobacco use: The patient denies current or previous tobacco use. Alcohol use: none  No drug use Exercise: moderately active  She does get adequate calcium and Vitamin D in her diet.  Labs with PCP.   Past Medical History:  Diagnosis Date   Anxiety    Diabetes mellitus without complication (HCC)    Hyperlipidemia    Hypertension    Hypothyroidism    Rosacea    Dr. Lovell Sheehan   Thyroid disease 2000   s/p XRT to thyroid, Dr. Sabra Heck at [facility]    Past Surgical History:  Procedure Laterality Date   ABDOMINAL HYSTERECTOMY     has one ovary    APPENDECTOMY     BREAST BIOPSY Right 2006   neg   BREAST SURGERY     CESAREAN SECTION     4, Dr. Buford Dresser   HERNIA REPAIR     abdominal   KNEE ARTHROSCOPY W/ MENISCAL REPAIR     2    Family History  Problem Relation Age of Onset   Hyperlipidemia Mother    Hypertension Mother    Breast cancer Mother 64   Colon polyps Mother    Hypertension Father    Heart disease Father    Hypertension Brother    Breast cancer Paternal Grandmother         later in life   Colon cancer Neg Hx    Ovarian cancer Neg Hx    Thyroid cancer Neg Hx     Social History   Socioeconomic History   Marital status: Married    Spouse name: Not on file   Number of children: Not on file   Years of education: Not on file   Highest education level: Not on file  Occupational History   Not on file  Tobacco Use   Smoking status: Never   Smokeless tobacco: Never  Vaping Use   Vaping Use: Never used  Substance and Sexual Activity   Alcohol use: Yes    Comment: occasionally   Drug use: No   Sexual activity: Not Currently    Birth control/protection: Surgical    Comment: Hysterectomy  Other Topics Concern   Not on file  Social History Narrative   Lives in [place]. Has 4 children. 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       Social Determinants of Health   Financial Resource Strain: Not on file  Food Insecurity: Not on file  Transportation Needs: Not  on file  Physical Activity: Not on file  Stress: Not on file  Social Connections: Not on file  Intimate Partner Violence: Not on file     Current Outpatient Medications:    escitalopram (LEXAPRO) 10 MG tablet, TAKE 1 TABLET BY MOUTH DAILY, Disp: 30 tablet, Rfl: 3   levothyroxine (SYNTHROID) 75 MCG tablet, TAKE ONE TABLET BY MOUTH EVERY MORNING, Disp: 90 tablet, Rfl: 2   lisinopril-hydrochlorothiazide (ZESTORETIC) 10-12.5 MG tablet, TAKE 1 TABLET BY MOUTH DAILY, Disp: 30 tablet, Rfl: 3   Multiple Vitamin (MULTIVITAMIN) tablet, Take 1 tablet by mouth daily., Disp: , Rfl:    rosuvastatin (CRESTOR) 10 MG tablet, TAKE 1 TABLET BY MOUTH DAILY, Disp: 30 tablet, Rfl: 3   TRULICITY 3 EL/3.8BO SOPN, INJECT 3 MG UNDER THE SKIN ONCE WEEKLY, Disp: 2 mL, Rfl: 2   valACYclovir (VALTREX) 1000 MG tablet, TAKE 2 TABLETS(2000 MG) BY MOUTH EVERY 12 HOURS FOR 1 DAY. START: ASAP AFTER SYMPTOM ONSET, Disp: 30 tablet, Rfl: 1  Current Facility-Administered Medications:    betamethasone acetate-betamethasone  sodium phosphate (CELESTONE) injection 3 mg, 3 mg, Intramuscular, Once, Evans, Brent M, DPM     ROS:  Review of Systems  Constitutional:  Negative for fatigue, fever and unexpected weight change.  Respiratory:  Negative for cough, shortness of breath and wheezing.   Cardiovascular:  Negative for chest pain, palpitations and leg swelling.  Gastrointestinal:  Negative for blood in stool, constipation, diarrhea, nausea and vomiting.  Endocrine: Negative for cold intolerance, heat intolerance and polyuria.  Genitourinary:  Negative for dyspareunia, dysuria, flank pain, frequency, genital sores, hematuria, menstrual problem, pelvic pain, urgency, vaginal bleeding, vaginal discharge and vaginal pain.  Musculoskeletal:  Negative for back pain, joint swelling and myalgias.  Skin:  Negative for rash.  Neurological:  Negative for dizziness, syncope, light-headedness, numbness and headaches.  Hematological:  Negative for adenopathy.  Psychiatric/Behavioral:  Negative for agitation, confusion, sleep disturbance and suicidal ideas. The patient is not nervous/anxious.   BREAST: No symptoms    Objective: There were no vitals taken for this visit.   Physical Exam Constitutional:      Appearance: She is well-developed.  Genitourinary:     Vulva normal.     Genitourinary Comments: UTERUS/CX SURG REM     Right Labia: No rash, tenderness or lesions.    Left Labia: No tenderness, lesions or rash.    Vaginal cuff intact.    No vaginal discharge, erythema or tenderness.     Mild vaginal atrophy present.     Right Adnexa: not tender and no mass present.    Left Adnexa: not tender and no mass present.    Cervix is absent.     Uterus is absent.  Breasts:    Right: No mass, nipple discharge, skin change or tenderness.     Left: No mass, nipple discharge, skin change or tenderness.  Neck:     Thyroid: No thyromegaly.  Cardiovascular:     Rate and Rhythm: Normal rate and regular rhythm.      Heart sounds: Normal heart sounds. No murmur heard. Pulmonary:     Effort: Pulmonary effort is normal.     Breath sounds: Normal breath sounds.  Abdominal:     Palpations: Abdomen is soft.     Tenderness: There is no abdominal tenderness. There is no guarding.  Musculoskeletal:        General: Normal range of motion.     Cervical back: Normal range of motion.  Neurological:  General: No focal deficit present.     Mental Status: She is alert and oriented to person, place, and time.     Cranial Nerves: No cranial nerve deficit.  Skin:    General: Skin is warm and dry.  Psychiatric:        Mood and Affect: Mood normal.        Behavior: Behavior normal.        Thought Content: Thought content normal.        Judgment: Judgment normal.  Vitals reviewed.     Assessment/Plan: Encounter for annual routine gynecological examination  Encounter for screening mammogram for malignant neoplasm of breast; pt current on mammo.          GYN counsel breast self exam, menopause, adequate intake of calcium and vitamin D, diet and exercise    F/U  No follow-ups on file.  Kenika Sahm B. Osamah Schmader, PA-C 04/20/2022 10:04 PM

## 2022-04-23 ENCOUNTER — Ambulatory Visit (INDEPENDENT_AMBULATORY_CARE_PROVIDER_SITE_OTHER): Payer: 59 | Admitting: Obstetrics and Gynecology

## 2022-04-23 ENCOUNTER — Encounter: Payer: Self-pay | Admitting: Obstetrics and Gynecology

## 2022-04-23 VITALS — BP 110/64 | Ht 62.5 in | Wt 138.0 lb

## 2022-04-23 DIAGNOSIS — Z1211 Encounter for screening for malignant neoplasm of colon: Secondary | ICD-10-CM

## 2022-04-23 DIAGNOSIS — Z01419 Encounter for gynecological examination (general) (routine) without abnormal findings: Secondary | ICD-10-CM | POA: Diagnosis not present

## 2022-04-23 DIAGNOSIS — Z1231 Encounter for screening mammogram for malignant neoplasm of breast: Secondary | ICD-10-CM

## 2022-04-23 NOTE — Patient Instructions (Signed)
I value your feedback and you entrusting us with your care. If you get a [facility] patient survey, I would appreciate you taking the time to let us know about your experience today. Thank you! ? ? ?

## 2022-04-25 ENCOUNTER — Encounter: Payer: Self-pay | Admitting: Family

## 2022-04-25 ENCOUNTER — Ambulatory Visit: Payer: 59 | Admitting: Family

## 2022-04-25 VITALS — BP 102/80 | HR 76 | Temp 98.2°F | Ht 62.0 in | Wt 136.4 lb

## 2022-04-25 DIAGNOSIS — E119 Type 2 diabetes mellitus without complications: Secondary | ICD-10-CM | POA: Diagnosis not present

## 2022-04-25 DIAGNOSIS — E039 Hypothyroidism, unspecified: Secondary | ICD-10-CM

## 2022-04-25 DIAGNOSIS — I10 Essential (primary) hypertension: Secondary | ICD-10-CM | POA: Diagnosis not present

## 2022-04-25 LAB — COMPREHENSIVE METABOLIC PANEL
ALT: 17 U/L (ref 0–35)
AST: 22 U/L (ref 0–37)
Albumin: 4.4 g/dL (ref 3.5–5.2)
Alkaline Phosphatase: 59 U/L (ref 39–117)
BUN: 20 mg/dL (ref 6–23)
CO2: 32 mEq/L (ref 19–32)
Calcium: 9.4 mg/dL (ref 8.4–10.5)
Chloride: 101 mEq/L (ref 96–112)
Creatinine, Ser: 0.8 mg/dL (ref 0.40–1.20)
GFR: 78.4 mL/min (ref >60.00)
Glucose, Bld: 86 mg/dL (ref 70–99)
Potassium: 3.6 mEq/L (ref 3.5–5.1)
Sodium: 142 mEq/L (ref 135–145)
Total Bilirubin: 0.7 mg/dL (ref 0.2–1.2)
Total Protein: 7.2 g/dL (ref 6.0–8.3)

## 2022-04-25 LAB — MICROALBUMIN / CREATININE URINE RATIO
Creatinine,U: 228 mg/dL
Microalb Creat Ratio: 0.5 mg/g (ref 0.0–30.0)
Microalb, Ur: 1.2 mg/dL (ref 0.0–1.9)

## 2022-04-25 LAB — LIPID PANEL
Cholesterol: 115 mg/dL (ref 0–200)
HDL: 48.6 mg/dL (ref >39.00)
LDL Cholesterol: 52 mg/dL (ref 0–99)
NonHDL: 66.52
Total CHOL/HDL Ratio: 2
Triglycerides: 75 mg/dL (ref 0.0–149.0)
VLDL: 15 mg/dL (ref 0.0–40.0)

## 2022-04-25 LAB — TSH: TSH: 0.57 u[IU]/mL (ref 0.35–5.50)

## 2022-04-25 LAB — VITAMIN D 25 HYDROXY (VIT D DEFICIENCY, FRACTURES): VITD: 46.65 ng/mL (ref 30.00–100.00)

## 2022-04-25 MED ORDER — TIRZEPATIDE 5 MG/0.5ML ~~LOC~~ SOAJ: 5.0000 mg | SUBCUTANEOUS | 1 refills | Status: DC

## 2022-04-25 MED ORDER — TIRZEPATIDE 2.5 MG/0.5ML ~~LOC~~ SOAJ: 2.5000 mg | SUBCUTANEOUS | 1 refills | Status: DC

## 2022-04-25 NOTE — Assessment & Plan Note (Signed)
Excellent control, continue lisinopril hydrochlorothiazide 10-12.5 mg

## 2022-04-25 NOTE — Patient Instructions (Signed)
If Lindsay Mejia is not covered through your insurance, you may go to the [employer] at [email or web address] to complete information for savings card.   You may also use the link below.   [email or web address]  You may take this savings to [facility], [facility] or [facility]. If you are unable to get mounjaro, please call to schedule an appointment with me so we can discuss alternative weight loss medications.   start Mounjaro 2.'5mg'$  once per week injected subcutaneously ( [place])  in stomach. Please clean with alcohol swab prior to injection and be sure to rotate site. You may schedule a nurse visit if you would like to first injection.   After 4 weeks, and if tolerated and weight loss has not reached 1-2 lbs per week, please increase to '5mg'$  once per week [place].    Please read information on medication below and remember black box warning that you may not take if you or a family member is diagnosed with thyroid cancer (medullary thyroid cancer), or multiple endocrine neoplasia.       Tirzepatide Injection Lindsay Mejia) What is this medication? TIRZEPATIDE (tir ZEP a tide) treats type 2 diabetes. It works by increasing insulin levels in your body, which decreases your blood sugar (glucose). Changes to diet and exercise are often combined with this medication. This medicine may be used for other purposes; ask your health care provider or pharmacist if you have questions. COMMON BRAND NAME(S): MOUNJARO What should I tell my care team before I take this medication? They need to know if you have any of these conditions: Endocrine tumors (MEN 2) or if someone in your family had these tumors Eye disease, vision problems Gallbladder disease History of pancreatitis Kidney disease Stomach or intestine problems Thyroid cancer or if someone in your family had thyroid cancer An unusual or allergic reaction to tirzepatide, other medications, foods, dyes, or preservatives Pregnant or  trying to get pregnant Breast-feeding How should I use this medication? This medication is injected under the skin. You will be taught how to prepare and give it. It is given once every week (every 7 days). Keep taking it unless your health care provider tells you to stop. If you use this medication with insulin, you should inject this medication and the insulin separately. Do not mix them together. Do not give the injections right next to each other. Change (rotate) injection sites with each injection. This medication comes with INSTRUCTIONS FOR USE. Ask your pharmacist for directions on how to use this medication. Read the information carefully. Talk to your pharmacist or care team if you have questions. It is important that you put your used needles and syringes in a special sharps container. Do not put them in a trash can. If you do not have a sharps container, call your pharmacist or care team to get one. A special MedGuide will be given to you by the pharmacist with each prescription and refill. Be sure to read this information carefully each time. Talk to your care team about the use of this medication in children. Special care may be needed. Overdosage: If you think you have taken too much of this medicine contact a poison control center or emergency room at once. NOTE: This medicine is only for you. Do not share this medicine with others. What if I miss a dose? If you miss a dose, take it as soon as you can unless it is more than 4 days (96 hours) late. If it is more than 4 days late,  skip the missed dose. Take the next dose at the normal time. Do not take 2 doses within 3 days of each other. What may interact with this medication? Alcohol containing beverages Antiviral medications for HIV or AIDS Aspirin and aspirin-like medications Beta-blockers like atenolol, metoprolol, propranolol Certain medications for blood pressure, heart disease, irregular heart  beat Chromium Clonidine Diuretics Female hormones, such as estrogens or progestins, birth control pills Fenofibrate Gemfibrozil Guanethidine Isoniazid Lanreotide Female hormones or anabolic steroids MAOIs like Carbex, Eldepryl, Marplan, Nardil, and Parnate Medications for weight loss Medications for allergies, asthma, cold, or cough Medications for depression, anxiety, or psychotic disturbances Niacin Nicotine NSAIDs, medications for pain and inflammation, like ibuprofen or naproxen Octreotide Other medications for diabetes, like glyburide, glipizide, or glimepiride Pasireotide Pentamidine Phenytoin Probenecid Quinolone antibiotics such as ciprofloxacin, levofloxacin, ofloxacin Reserpine Some herbal dietary supplements Steroid medications such as prednisone or cortisone Sulfamethoxazole; trimethoprim Thyroid hormones Warfarin This list may not describe all possible interactions. Give your health care provider a list of all the medicines, herbs, non-prescription drugs, or dietary supplements you use. Also tell them if you smoke, drink alcohol, or use illegal drugs. Some items may interact with your medicine. What should I watch for while using this medication? Visit your care team for regular checks on your progress. Drink plenty of fluids while taking this medication. Check with your care team if you get an attack of severe diarrhea, nausea, and vomiting. The loss of too much body fluid can make it dangerous for you to take this medication. A test called the HbA1C (A1C) will be monitored. This is a simple blood test. It measures your blood sugar control over the last 2 to 3 months. You will receive this test every 3 to 6 months. Learn how to check your blood sugar. Learn the symptoms of low and high blood sugar and how to manage them. Always carry a quick-source of sugar with you in case you have symptoms of low blood sugar. Examples include hard sugar candy or glucose tablets.  Make sure others know that you can choke if you eat or drink when you develop serious symptoms of low blood sugar, such as seizures or unconsciousness. They must get medical help at once. Tell your care team if you have high blood sugar. You might need to change the dose of your medication. If you are sick or exercising more than usual, you might need to change the dose of your medication. Do not skip meals. Ask your care team if you should avoid alcohol. Many nonprescription cough and cold products contain sugar or alcohol. These can affect blood sugar. Pens should never be shared. Even if the needle is changed, sharing may result in passing of viruses like hepatitis or HIV. Wear a medical ID bracelet or chain, and carry a card that describes your disease and details of your medication and dosage times. Birth control may not work properly while you are taking this medication. If you take birth control pills by mouth, your care team may recommend another type of birth control for 4 weeks after you start this medication and for 4 weeks after each increase in your dose of this medication. Ask your care team which birth control methods you should use. What side effects may I notice from receiving this medication? Side effects that you should report to your care team as soon as possible: Allergic reactions-skin rash, itching, hives, swelling of the face, lips, tongue, or throat Change in vision Dehydration-increased thirst, dry mouth,  feeling faint or lightheaded, headache, dark yellow or brown urine Gallbladder problems-severe stomach pain, nausea, vomiting, fever Kidney injury-decrease in the amount of urine, swelling of the ankles, hands, or feet Pancreatitis-severe stomach pain that spreads to your back or gets worse after eating or when touched, fever, nausea, vomiting Thyroid cancer-new mass or lump in the neck, pain or trouble swallowing, trouble breathing, hoarseness Side effects that usually do  not require medical attention (report these to your care team if they continue or are bothersome): Constipation Diarrhea Loss of Appetite Nausea Stomach pain Upset stomach Vomiting This list may not describe all possible side effects. Call your doctor for medical advice about side effects. You may report side effects to FDA at 1-800-FDA-1088. Where should I keep my medication? Keep out of the reach of children and pets. Refrigeration (preferred): Store unopened pens in a refrigerator between 2 and 8 degrees C (36 and 46 degrees F). Keep it in the original carton until you are ready to take it. Do not freeze or use if the medication has been frozen. Protect from light. Get rid of any unused medication after the expiration date on the label. Room Temperature: The pen may be stored at room temperature below 30 degrees C (86 degrees F) for up to a total of 21 days if needed. Protect from light. Avoid exposure to extreme heat. If it is stored at room temperature, throw away any unused medication after 21 days or after it expires, whichever is first. The pen has glass parts. Handle it carefully. If you drop the pen on a hard surface, do not use it. Use a new pen for your injection. To get rid of medications that are no longer needed or have expired: Take the medication to a medication take-back program. Check with your pharmacy or law enforcement to find a location. If you cannot return the medication, ask your pharmacist or care team how to get rid of this medication safely. NOTE: This sheet is a summary. It may not cover all possible information. If you have questions about this medicine, talk to your doctor, pharmacist, or health care provider.  2022 Elsevier/Gold Standard (2020-08-30 13:57:48)

## 2022-04-25 NOTE — Progress Notes (Signed)
Assessment & Plan:  Controlled type 2 diabetes mellitus without complication, without long-term current use of insulin (HCC) Assessment & Plan: Pending A1c.  We discussed a trial Mounjaro and we will see if this is covered by insurance or patient can obtain manufactures coupon.  Counseled on side effects, mechanism of action.  If unable to get [facility], patient would prefer to increase Trulicity to 4.5 mg  Orders: -     Lipid panel -     Comprehensive metabolic panel -     Microalbumin / creatinine urine ratio -     Tirzepatide; Inject 2.5 mg into the skin once a week. X 4 weeks  Dispense: 2 mL; Refill: 1 -     Tirzepatide; Inject 5 mg into the skin once a week. Start after 4 weeks on mounjaro 2.'5mg'$   Dispense: 6 mL; Refill: 1  Hypothyroidism, unspecified type Assessment & Plan: Pending TSH.  Continue Synthroid 75 mcg  Orders: -     TSH -     VITAMIN D 25 Hydroxy (Vit-D Deficiency, Fractures)  Essential hypertension, benign Assessment & Plan: Excellent control, continue lisinopril hydrochlorothiazide 10-12.5 mg  Orders: -     Comprehensive metabolic panel     Return precautions given.   Risks, benefits, and alternatives of the medications and treatment plan prescribed today were discussed, and patient expressed understanding.   Education regarding symptom management and diagnosis given to patient on AVS either electronically or printed.  No follow-ups on file.  Mable Paris, FNP  Subjective:    Patient ID: Lindsay Mejia, Lindsay Mejia    DOB: 23-Jan-1961, 63 y.o.   MRN: [id number]  CC: Lindsay Mejia is a 62 y.o. Lindsay Mejia who presents today for follow up.   HPI: Feels well today.  No new complaints.  Trulicity has been expensive for her.  She would also be interested in increasing the dose.     Allergies: Patient has no active allergies. Current Outpatient Medications on File Prior to Visit  Medication Sig Dispense Refill   escitalopram (LEXAPRO) 10 MG tablet TAKE 1  TABLET BY MOUTH DAILY 30 tablet 3   levothyroxine (SYNTHROID) 75 MCG tablet TAKE ONE TABLET BY MOUTH EVERY MORNING 90 tablet 2   lisinopril-hydrochlorothiazide (ZESTORETIC) 10-12.5 MG tablet TAKE 1 TABLET BY MOUTH DAILY 30 tablet 3   Multiple Vitamin (MULTIVITAMIN) tablet Take 1 tablet by mouth daily.     rosuvastatin (CRESTOR) 10 MG tablet TAKE 1 TABLET BY MOUTH DAILY 30 tablet 3   valACYclovir (VALTREX) 1000 MG tablet TAKE 2 TABLETS(2000 MG) BY MOUTH EVERY 12 HOURS FOR 1 DAY. START: ASAP AFTER SYMPTOM ONSET 30 tablet 1   Current Facility-Administered Medications on File Prior to Visit  Medication Dose Route Frequency Provider Last Rate Last Admin   betamethasone acetate-betamethasone sodium phosphate (CELESTONE) injection 3 mg  3 mg Intramuscular Once Edrick Kins, DPM        Review of Systems  Constitutional:  Negative for chills, fever and unexpected weight change.  HENT:  Negative for congestion.   Respiratory:  Negative for cough.   Cardiovascular:  Negative for chest pain, palpitations and leg swelling.  Gastrointestinal:  Negative for nausea and vomiting.  Musculoskeletal:  Negative for arthralgias and myalgias.  Skin:  Negative for rash.  Neurological:  Negative for headaches.  Hematological:  Negative for adenopathy.  Psychiatric/Behavioral:  Negative for confusion.       Objective:    BP 102/80   Pulse 76   Temp 98.2 F (  36.8 C) (Oral)   Ht '5\' 2"'$  (1.575 m)   Wt 136 lb 6.4 oz (61.9 kg)   SpO2 96%   BMI 24.95 kg/m  BP Readings from Last 3 Encounters:  04/25/22 102/80  04/23/22 110/64  12/12/21 124/72   Wt Readings from Last 3 Encounters:  04/25/22 136 lb 6.4 oz (61.9 kg)  04/23/22 138 lb (62.6 kg)  12/12/21 133 lb 9.6 oz (60.6 kg)    Physical Exam Vitals reviewed.  Constitutional:      Appearance: She is well-developed.  Eyes:     Conjunctiva/sclera: Conjunctivae normal.  Cardiovascular:     Rate and Rhythm: Normal rate and regular rhythm.     Pulses:  Normal pulses.     Heart sounds: Normal heart sounds.  Pulmonary:     Effort: Pulmonary effort is normal.     Breath sounds: Normal breath sounds. No wheezing, rhonchi or rales.  Skin:    General: Skin is warm and dry.  Neurological:     Mental Status: She is alert.  Psychiatric:        Speech: Speech normal.        Behavior: Behavior normal.        Thought Content: Thought content normal.

## 2022-04-25 NOTE — Assessment & Plan Note (Signed)
Pending TSH.  Continue Synthroid 75 mcg

## 2022-04-25 NOTE — Assessment & Plan Note (Signed)
Pending A1c.  We discussed a trial Mounjaro and we will see if this is covered by insurance or patient can obtain manufactures coupon.  Counseled on side effects, mechanism of action.  If unable to get [facility], patient would prefer to increase Trulicity to 4.5 mg

## 2022-04-30 ENCOUNTER — Other Ambulatory Visit (HOSPITAL_COMMUNITY): Payer: Self-pay

## 2022-05-06 ENCOUNTER — Encounter: Payer: Self-pay | Admitting: Family

## 2022-05-09 NOTE — Telephone Encounter (Signed)
Patient Advocate Encounter   Received notification from [facility] that prior authorization for Lindsay Mejia is required.   PA submitted on 05/09/2022 Key [id number] Status is pending

## 2022-05-11 ENCOUNTER — Other Ambulatory Visit: Payer: Self-pay

## 2022-05-11 MED ORDER — TRULICITY 4.5 MG/0.5ML ~~LOC~~ SOAJ: 4.5000 mg | SUBCUTANEOUS | 3 refills | Status: DC

## 2022-05-11 NOTE — Telephone Encounter (Signed)
Rx sent Pt advised

## 2022-05-15 NOTE — Telephone Encounter (Signed)
Spoke to pt and informed her of the Denial of Mounjaro. Informed pt also that they were going to resubmit to see if they would approve it. I told pt I will contact her as soon as I hear something back from them.

## 2022-05-15 NOTE — Telephone Encounter (Signed)
Pharmacy Patient Advocate Encounter  Received notification from [facility] that the request for prior authorization for Lindsay Mejia has been denied due to .    Original PA included documentation of trial/failure of trulicity and victoza. Currently submitting new CMM, with same information, hopefully we will get a determination. New Key: [id number]

## 2022-05-16 NOTE — Telephone Encounter (Signed)
Received message regarding new CMM key, [id number], stating that PA can't be completed due to previous denial. PA forwarded to [facility] department.

## 2022-06-04 ENCOUNTER — Other Ambulatory Visit: Payer: Self-pay | Admitting: Family

## 2022-06-04 DIAGNOSIS — E119 Type 2 diabetes mellitus without complications: Secondary | ICD-10-CM

## 2022-06-06 ENCOUNTER — Other Ambulatory Visit: Payer: Self-pay | Admitting: Family

## 2022-06-06 DIAGNOSIS — I10 Essential (primary) hypertension: Secondary | ICD-10-CM

## 2022-06-06 DIAGNOSIS — E119 Type 2 diabetes mellitus without complications: Secondary | ICD-10-CM

## 2022-06-06 DIAGNOSIS — F32A Depression, unspecified: Secondary | ICD-10-CM

## 2022-06-15 ENCOUNTER — Other Ambulatory Visit: Payer: Self-pay | Admitting: Family

## 2022-06-15 DIAGNOSIS — E119 Type 2 diabetes mellitus without complications: Secondary | ICD-10-CM

## 2022-06-18 ENCOUNTER — Encounter: Payer: Self-pay | Admitting: Family

## 2022-06-19 ENCOUNTER — Other Ambulatory Visit: Payer: Self-pay

## 2022-06-19 MED ORDER — TRULICITY 3 MG/0.5ML ~~LOC~~ SOAJ: 3.0000 mg | SUBCUTANEOUS | 2 refills | Status: DC

## 2022-06-22 ENCOUNTER — Telehealth: Payer: Self-pay

## 2022-06-22 ENCOUNTER — Other Ambulatory Visit (HOSPITAL_COMMUNITY): Payer: Self-pay

## 2022-06-22 NOTE — Telephone Encounter (Signed)
Pharmacy Patient Advocate Encounter   Received notification from [facility] that prior authorization for Trulicity '3MG'$ /0.5ML pen-injectors is required/requested.  Per Test Claim: PA required, Step therapy required   PA submitted on 06/22/22 to (ins) [employer] via [employer] or [facility]) confirmation # [id number] Status is pending

## 2022-06-25 ENCOUNTER — Other Ambulatory Visit (HOSPITAL_COMMUNITY): Payer: Self-pay

## 2022-06-25 NOTE — Telephone Encounter (Signed)
Patient Advocate Encounter  Prior Authorization for Trulicity '3MG'$ /0.5ML pen-injectors has been approved.    PA# [id number] Effective dates: 06/22/22 through 06/22/23

## 2022-07-30 ENCOUNTER — Other Ambulatory Visit: Payer: Self-pay | Admitting: Family

## 2022-07-30 ENCOUNTER — Encounter: Payer: Self-pay | Admitting: Family

## 2022-07-30 DIAGNOSIS — E119 Type 2 diabetes mellitus without complications: Secondary | ICD-10-CM

## 2022-07-30 MED ORDER — TIRZEPATIDE 2.5 MG/0.5ML ~~LOC~~ SOAJ: 2.5000 mg | SUBCUTANEOUS | 2 refills | Status: DC

## 2022-08-14 ENCOUNTER — Encounter: Payer: Self-pay | Admitting: Family

## 2022-08-14 ENCOUNTER — Ambulatory Visit: Payer: 59 | Admitting: Family

## 2022-08-14 VITALS — BP 118/70 | HR 59 | Temp 97.7°F | Ht 62.0 in | Wt 133.6 lb

## 2022-08-14 DIAGNOSIS — Z7985 Long-term (current) use of injectable non-insulin antidiabetic drugs: Secondary | ICD-10-CM

## 2022-08-14 DIAGNOSIS — Z23 Encounter for immunization: Secondary | ICD-10-CM

## 2022-08-14 DIAGNOSIS — Z789 Other specified health status: Secondary | ICD-10-CM | POA: Diagnosis not present

## 2022-08-14 DIAGNOSIS — Z111 Encounter for screening for respiratory tuberculosis: Secondary | ICD-10-CM | POA: Diagnosis not present

## 2022-08-14 DIAGNOSIS — I10 Essential (primary) hypertension: Secondary | ICD-10-CM

## 2022-08-14 DIAGNOSIS — E119 Type 2 diabetes mellitus without complications: Secondary | ICD-10-CM

## 2022-08-14 DIAGNOSIS — Z1159 Encounter for screening for other viral diseases: Secondary | ICD-10-CM | POA: Diagnosis not present

## 2022-08-14 LAB — HEMOGLOBIN A1C: Hgb A1c MFr Bld: 5.7 % (ref 4.6–6.5)

## 2022-08-14 MED ORDER — TRULICITY 3 MG/0.5ML ~~LOC~~ SOAJ
3.0000 mg | SUBCUTANEOUS | 3 refills | Status: DC
Start: 2022-08-14 — End: 2023-01-25

## 2022-08-14 MED ORDER — TRULICITY 4.5 MG/0.5ML ~~LOC~~ SOAJ: 4.5000 mg | SUBCUTANEOUS | 3 refills | Status: DC

## 2022-08-14 NOTE — Progress Notes (Addendum)
Assessment & Plan:  Controlled type 2 diabetes mellitus without complication, without long-term current use of insulin (HCC) Assessment & Plan: Excellent control.  Greggory Keen has increasingly become expensive for her.  I have sent in Trulcity 4.5mg . If  cannot get trulity  4.5mg , she would prefer to  resume trulicity 3mg  as she had previously taken.   Addendum: Patient notified us while in our lab that pharmacy does not have Trulicity 4.5 mg.  I have sent in Trulicity 3 mg    Orders: -     Hemoglobin A1c -     Trulicity; Inject 3 mg as directed once a week.  Dispense: 2 mL; Refill: 3  No history of vaccination for measles, mumps, rubella (MMR) -     Measles/Mumps/Rubella Immunity  Need for hepatitis B screening test -     Hepatitis B surface antibody,quantitative  Essential hypertension, benign Assessment & Plan: Excellent control, continue lisinopril hydrochlorothiazide 10-12.5 mg   Screening for tuberculosis -     TB Skin Test     Return precautions given.   Risks, benefits, and alternatives of the medications and treatment plan prescribed today were discussed, and patient expressed understanding.   Education regarding symptom management and diagnosis given to patient on AVS either electronically or printed.  Return in about 6 months (around 02/13/2023).  Rennie Plowman, FNP  Subjective:    Patient ID: Lindsay Mejia, female    DOB: Sep 13, 1960, 62 y.o.   MRN: [id number]  CC: Lindsay Mejia is a 62 y.o. female who presents today for follow up.   HPI: Feels well today.  No new complaints.  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  Greggory Keen has become increasingly expensive for her.  If continues to be expensive, she would like to change back to Trulicity and try higher dose 4.5 mg.  Previously she had tolerated 3 mg dose.  Trulicity worked well for  Lindsay Mejia maintenance as well as control of diabetes    Allergies: Patient has no active  allergies. Current Outpatient Medications on File Prior to Visit  Medication Sig Dispense Refill   escitalopram (LEXAPRO) 10 MG tablet TAKE 1 TABLET BY MOUTH DAILY 30 tablet 3   levothyroxine (SYNTHROID) 75 MCG tablet TAKE ONE TABLET BY MOUTH EVERY MORNING 90 tablet 2   lisinopril-hydrochlorothiazide (ZESTORETIC) 10-12.5 MG tablet TAKE 1 TABLET BY MOUTH DAILY 30 tablet 3   Multiple Vitamin (MULTIVITAMIN) tablet Take 1 tablet by mouth daily.     rosuvastatin (CRESTOR) 10 MG tablet TAKE 1 TABLET BY MOUTH DAILY 30 tablet 3   valACYclovir (VALTREX) 1000 MG tablet TAKE 2 TABLETS(2000 MG) BY MOUTH EVERY 12 HOURS FOR 1 DAY. START: ASAP AFTER SYMPTOM ONSET 30 tablet 1   Current Facility-Administered Medications on File Prior to Visit  Medication Dose Route Frequency Provider Last Rate Last Admin   betamethasone acetate-betamethasone sodium phosphate (CELESTONE) injection 3 mg  3 mg Intramuscular Once Felecia Shelling, DPM        Review of Systems  Constitutional:  Negative for chills and fever.  Respiratory:  Negative for cough.   Cardiovascular:  Negative for chest pain and palpitations.  Gastrointestinal:  Negative for nausea and vomiting.      Objective:    BP 118/70   Pulse (!) 59   Temp 97.7 F (36.5 C) (Oral)   Ht 5\' 2"  (1.575 m)   Wt 133 lb 9.6 oz (60.6 kg)   SpO2 99%   BMI 24.44 kg/m  BP Readings from Last 3 Encounters:  08/14/22 118/70  04/25/22 102/80  04/23/22 110/64   Wt Readings from Last 3 Encounters:  08/14/22 133 lb 9.6 oz (60.6 kg)  04/25/22 136 lb 6.4 oz (61.9 kg)  04/23/22 138 lb (62.6 kg)    Physical Exam Vitals reviewed.  Constitutional:      Appearance: She is well-developed.  Eyes:     Conjunctiva/sclera: Conjunctivae normal.  Cardiovascular:     Rate and Rhythm: Normal rate and regular rhythm.     Pulses: Normal pulses.     Heart sounds: Normal heart sounds.  Pulmonary:     Effort: Pulmonary effort is normal.     Breath sounds: Normal breath  sounds. No wheezing, rhonchi or rales.  Skin:    General: Skin is warm and dry.  Neurological:     Mental Status: She is alert.  Psychiatric:        Speech: Speech normal.        Behavior: Behavior normal.        Thought Content: Thought content normal.

## 2022-08-14 NOTE — Addendum Note (Signed)
Addended by: Allegra Grana on: 08/14/2022 09:54 AM   Modules accepted: Orders

## 2022-08-14 NOTE — Assessment & Plan Note (Addendum)
Excellent control.  Lindsay Mejia has increasingly become expensive for her.  I have sent in Trulcity 4.5mg . If  cannot get trulity  4.5mg , she would prefer to  resume trulicity 3mg  as she had previously taken.   Addendum: Patient notified us while in our lab that pharmacy does not have Trulicity 4.5 mg.  I have sent in Trulicity 3 mg

## 2022-08-14 NOTE — Assessment & Plan Note (Signed)
Excellent control, continue lisinopril hydrochlorothiazide 10-12.5 mg 

## 2022-08-14 NOTE — Addendum Note (Signed)
Addended by: [place], Kaaren Nass on: 08/14/2022 10:11 AM   Modules accepted: Orders

## 2022-08-14 NOTE — Telephone Encounter (Signed)
Noted  

## 2022-08-14 NOTE — Addendum Note (Signed)
Addended by: Sandy Salaam on: 08/14/2022 09:53 AM   Modules accepted: Orders

## 2022-08-14 NOTE — Patient Instructions (Signed)
Start trulicity 4.5mg   Stop mounjaro

## 2022-08-15 LAB — MEASLES/MUMPS/RUBELLA IMMUNITY
Mumps IgG: <9 AU/mL — ABNORMAL LOW
Rubella: 6.56 Index
Rubeola IgG: <13.5 AU/mL — ABNORMAL LOW

## 2022-08-15 LAB — HEPATITIS B SURFACE ANTIBODY, QUANTITATIVE: Hep B S AB Quant (Post): <5 m[IU]/mL — ABNORMAL LOW (ref >=10)

## 2022-08-16 ENCOUNTER — Ambulatory Visit: Payer: 59 | Admitting: *Deleted

## 2022-08-16 DIAGNOSIS — D2272 Melanocytic nevi of left lower limb, including hip: Secondary | ICD-10-CM | POA: Diagnosis not present

## 2022-08-16 DIAGNOSIS — L298 Other pruritus: Secondary | ICD-10-CM | POA: Diagnosis not present

## 2022-08-16 DIAGNOSIS — L820 Inflamed seborrheic keratosis: Secondary | ICD-10-CM | POA: Diagnosis not present

## 2022-08-16 DIAGNOSIS — L538 Other specified erythematous conditions: Secondary | ICD-10-CM | POA: Diagnosis not present

## 2022-08-16 DIAGNOSIS — Z85820 Personal history of malignant melanoma of skin: Secondary | ICD-10-CM | POA: Diagnosis not present

## 2022-08-16 DIAGNOSIS — D225 Melanocytic nevi of trunk: Secondary | ICD-10-CM | POA: Diagnosis not present

## 2022-08-16 DIAGNOSIS — D2261 Melanocytic nevi of right upper limb, including shoulder: Secondary | ICD-10-CM | POA: Diagnosis not present

## 2022-08-16 DIAGNOSIS — Z85828 Personal history of other malignant neoplasm of skin: Secondary | ICD-10-CM | POA: Diagnosis not present

## 2022-08-16 LAB — TB SKIN TEST
Induration: 0 mm
TB Skin Test: NEGATIVE

## 2022-08-16 NOTE — Progress Notes (Signed)
PPD Reading Note  PPD read and results entered in Epic[employer].  Result: 0 mm induration.  Interpretation: Negative  If test not read within 48-72 hours of initial placement, patient advised to repeat in other arm 1-3 weeks after this test.  Allergic reaction: no

## 2022-08-24 ENCOUNTER — Ambulatory Visit: Payer: 59 | Admitting: Family

## 2022-09-28 DIAGNOSIS — Z1211 Encounter for screening for malignant neoplasm of colon: Secondary | ICD-10-CM | POA: Diagnosis not present

## 2022-10-05 ENCOUNTER — Other Ambulatory Visit: Payer: Self-pay | Admitting: Family

## 2022-10-05 DIAGNOSIS — F419 Anxiety disorder, unspecified: Secondary | ICD-10-CM

## 2022-10-05 DIAGNOSIS — I10 Essential (primary) hypertension: Secondary | ICD-10-CM

## 2022-10-05 DIAGNOSIS — E119 Type 2 diabetes mellitus without complications: Secondary | ICD-10-CM

## 2022-10-14 ENCOUNTER — Other Ambulatory Visit: Payer: Self-pay | Admitting: Family

## 2022-10-14 DIAGNOSIS — E039 Hypothyroidism, unspecified: Secondary | ICD-10-CM

## 2022-10-22 ENCOUNTER — Other Ambulatory Visit: Payer: Self-pay | Admitting: Family

## 2022-10-22 DIAGNOSIS — Z1231 Encounter for screening mammogram for malignant neoplasm of breast: Secondary | ICD-10-CM

## 2022-12-05 DIAGNOSIS — D485 Neoplasm of uncertain behavior of skin: Secondary | ICD-10-CM | POA: Diagnosis not present

## 2022-12-06 ENCOUNTER — Ambulatory Visit
Admission: RE | Admit: 2022-12-06 | Discharge: 2022-12-06 | Disposition: A | Discharge status: Home / Self Care | Payer: 59 | Source: Ambulatory Visit | Attending: Family | Admitting: Family

## 2022-12-06 DIAGNOSIS — Z1231 Encounter for screening mammogram for malignant neoplasm of breast: Secondary | ICD-10-CM | POA: Insufficient documentation

## 2022-12-24 ENCOUNTER — Ambulatory Visit: Payer: 59 | Admitting: Anesthesiology

## 2022-12-24 ENCOUNTER — Ambulatory Visit
Admission: RE | Admit: 2022-12-24 | Discharge: 2022-12-24 | Disposition: A | Discharge status: Home / Self Care | Payer: 59 | Attending: Gastroenterology | Admitting: Gastroenterology

## 2022-12-24 ENCOUNTER — Encounter: Payer: Self-pay | Admitting: Gastroenterology

## 2022-12-24 ENCOUNTER — Encounter
Admission: RE | Disposition: A | Discharge status: Home / Self Care | Payer: Self-pay | Source: Home / Self Care | Attending: Gastroenterology

## 2022-12-24 DIAGNOSIS — K648 Other hemorrhoids: Secondary | ICD-10-CM | POA: Diagnosis not present

## 2022-12-24 DIAGNOSIS — Z7985 Long-term (current) use of injectable non-insulin antidiabetic drugs: Secondary | ICD-10-CM | POA: Insufficient documentation

## 2022-12-24 DIAGNOSIS — Z1211 Encounter for screening for malignant neoplasm of colon: Secondary | ICD-10-CM | POA: Diagnosis not present

## 2022-12-24 DIAGNOSIS — E119 Type 2 diabetes mellitus without complications: Secondary | ICD-10-CM | POA: Diagnosis not present

## 2022-12-24 DIAGNOSIS — Z83719 Family history of colon polyps, unspecified: Secondary | ICD-10-CM | POA: Insufficient documentation

## 2022-12-24 DIAGNOSIS — K6289 Other specified diseases of anus and rectum: Secondary | ICD-10-CM | POA: Diagnosis not present

## 2022-12-24 HISTORY — PX: COLONOSCOPY WITH PROPOFOL: SHX5780

## 2022-12-24 SURGERY — COLONOSCOPY WITH PROPOFOL
Anesthesia: General

## 2022-12-24 MED ORDER — DEXMEDETOMIDINE HCL IN NACL 200 MCG/50ML IV SOLN
INTRAVENOUS | Status: DC | PRN
  Administered 2022-12-24: 12 ug via INTRAVENOUS
  Administered 2022-12-24: 8 ug via INTRAVENOUS

## 2022-12-24 MED ORDER — SODIUM CHLORIDE 0.9 % IV SOLN: INTRAVENOUS | Status: DC

## 2022-12-24 MED ORDER — PHENYLEPHRINE 80 MCG/ML (10ML) SYRINGE FOR IV PUSH (FOR BLOOD PRESSURE SUPPORT)
PREFILLED_SYRINGE | INTRAVENOUS | Status: AC
  Filled 2022-12-24: qty 10

## 2022-12-24 MED ORDER — LIDOCAINE HCL (PF) 2 % IJ SOLN
INTRAMUSCULAR | Status: AC
  Filled 2022-12-24: qty 5

## 2022-12-24 MED ORDER — PROPOFOL 500 MG/50ML IV EMUL
INTRAVENOUS | Status: DC | PRN
  Administered 2022-12-24: 75 ug/kg/min via INTRAVENOUS

## 2022-12-24 MED ORDER — PHENYLEPHRINE HCL (PRESSORS) 10 MG/ML IV SOLN
INTRAVENOUS | Status: DC | PRN
  Administered 2022-12-24 (×2): 100 ug via INTRAVENOUS

## 2022-12-24 MED ORDER — LIDOCAINE HCL (CARDIAC) PF 100 MG/5ML IV SOSY
PREFILLED_SYRINGE | INTRAVENOUS | Status: DC | PRN
  Administered 2022-12-24: 60 mg via INTRAVENOUS

## 2022-12-24 MED ORDER — ONDANSETRON HCL 4 MG/2ML IJ SOLN
INTRAMUSCULAR | Status: AC
  Filled 2022-12-24: qty 2

## 2022-12-24 MED ORDER — PROPOFOL 10 MG/ML IV BOLUS
INTRAVENOUS | Status: DC | PRN
  Administered 2022-12-24: 50 mg via INTRAVENOUS

## 2022-12-24 NOTE — Transfer of Care (Signed)
Immediate Anesthesia Transfer of Care Note  Patient: Lindsay Mejia  Procedure(s) Performed: COLONOSCOPY WITH PROPOFOL  Patient Location: PACU  Anesthesia Type:General  Level of Consciousness: sedated  Airway & Oxygen Therapy: Patient Spontanous Breathing  Post-op Assessment: Report given to RN and Post -op Vital signs reviewed and stable  Post vital signs: Reviewed and stable  Last Vitals:  Vitals Value Taken Time  BP 89/51 12/24/22 1406  Temp    Pulse 69 12/24/22 1406  Resp 13 12/24/22 1406  SpO2 98 % 12/24/22 1406  Vitals shown include unfiled device data.  Last Pain:  Vitals:   12/24/22 1235  TempSrc: Temporal  PainSc: 0-No pain         Complications: No notable events documented.

## 2022-12-24 NOTE — Anesthesia Preprocedure Evaluation (Signed)
Anesthesia Evaluation  Patient identified by MRN, date of birth, ID band Patient awake    Reviewed: Allergy & Precautions, NPO status , Patient's Chart, lab work & pertinent test results  History of Anesthesia Complications Negative for: history of anesthetic complications  Airway Mallampati: II  TM Distance: >3 FB Neck ROM: Full    Dental no notable dental hx. (+) Teeth Intact   Pulmonary neg pulmonary ROS, neg sleep apnea, neg COPD, Patient abstained from smoking.Not current smoker   Pulmonary exam normal breath sounds clear to auscultation       Cardiovascular Exercise Tolerance: Good METShypertension, Pt. on medications (-) CAD and (-) Past MI (-) dysrhythmias  Rhythm:Regular Rate:Normal - Systolic murmurs    Neuro/Psych  PSYCHIATRIC DISORDERS Anxiety Depression    negative neurological ROS     GI/Hepatic ,neg GERD  ,,(+)     (-) substance abuse    Endo/Other  diabetes, Well ControlledHypothyroidism  Off of GLP1 for 8 days. Denies GI symptoms today  Renal/GU negative Renal ROS     Musculoskeletal   Abdominal   Peds  Hematology   Anesthesia Other Findings Past Medical History: No date: Anxiety No date: Diabetes mellitus without complication (HCC) No date: Hyperlipidemia No date: Hypertension No date: Hypothyroidism No date: Rosacea     Comment:  Dr. Wende Neighbors 2000: Thyroid disease     Comment:  s/p XRT to thyroid, Dr. Hyacinth Meeker at [facility]  Reproductive/Obstetrics                             Anesthesia Physical Anesthesia Plan  ASA: 2  Anesthesia Plan: General   Post-op Pain Management: Minimal or no pain anticipated   Induction: Intravenous  PONV Risk Score and Plan: 3 and Propofol infusion, TIVA and Ondansetron  Airway Management Planned: Nasal Cannula  Additional Equipment: None  Intra-op Plan:   Post-operative Plan:   Informed Consent: I have reviewed the  patients History and Physical, chart, labs and discussed the procedure including the risks, benefits and alternatives for the proposed anesthesia with the patient or authorized representative who has indicated his/her understanding and acceptance.     Dental advisory given  Plan Discussed with: CRNA and Surgeon  Anesthesia Plan Comments: (Discussed risks of anesthesia with patient, including possibility of difficulty with spontaneous ventilation under anesthesia necessitating airway intervention, PONV, and rare risks such as cardiac or respiratory or neurological events, and allergic reactions. Discussed the role of CRNA in patient's perioperative care. Patient understands.)       Anesthesia Quick Evaluation

## 2022-12-24 NOTE — H&P (Signed)
Pre-Procedure H&P   Patient ID: Lindsay Mejia is a 62 y.o. female.  Gastroenterology Provider: Jaynie Collins, DO  Referring Provider: Rennie Plowman, FNP PCP: Allegra Grana, FNP  Date: 12/24/2022  HPI Lindsay Mejia is a 62 y.o. female who presents today for Colonoscopy for Colorectal cancer screening .  Family history of colon polyps in her mother.  No family history of colon cancer  Last underwent colonoscopy in August 2019 demonstrating tortuous colon with internal hemorrhoids.  No polyps. 1-2 bowel movements per day.  No melena or hematochezia  Trulicity has been held for this procedure- last dose 12/16/22   Past Medical History:  Diagnosis Date   Anxiety    Diabetes mellitus without complication (HCC)    Hyperlipidemia    Hypertension    Hypothyroidism    Rosacea    Dr. Wende Neighbors   Thyroid disease 2000   s/p XRT to thyroid, Dr. Hyacinth Meeker at [facility]    Past Surgical History:  Procedure Laterality Date   ABDOMINAL HYSTERECTOMY     has one ovary    APPENDECTOMY     BREAST BIOPSY Right 2006   neg   BREAST SURGERY     CESAREAN SECTION     4, Dr. Weston Anna   HERNIA REPAIR     abdominal   KNEE ARTHROSCOPY W/ MENISCAL REPAIR     2    Family History Mother- colon polyps No other h/o GI disease or malignancy  Review of Systems  Constitutional:  Negative for activity change, appetite change, chills, diaphoresis, fatigue, fever and unexpected weight change.  HENT:  Negative for trouble swallowing and voice change.   Respiratory:  Negative for shortness of breath and wheezing.   Cardiovascular:  Negative for chest pain, palpitations and leg swelling.  Gastrointestinal:  Negative for abdominal distention, abdominal pain, anal bleeding, blood in stool, constipation, diarrhea, nausea, rectal pain and vomiting.  Musculoskeletal:  Negative for arthralgias and myalgias.  Skin:  Negative for color change and pallor.  Neurological:  Negative for  dizziness, syncope and weakness.  Psychiatric/Behavioral:  Negative for confusion.   All other systems reviewed and are negative.    Medications No current facility-administered medications on file prior to encounter.   Current Outpatient Medications on File Prior to Encounter  Medication Sig Dispense Refill   Dulaglutide (TRULICITY) 3 MG/0.5ML SOPN Inject 3 mg as directed once a week. 2 mL 3   Multiple Vitamin (MULTIVITAMIN) tablet Take 1 tablet by mouth daily.     valACYclovir (VALTREX) 1000 MG tablet TAKE 2 TABLETS(2000 MG) BY MOUTH EVERY 12 HOURS FOR 1 DAY. START: ASAP AFTER SYMPTOM ONSET 30 tablet 1    Pertinent medications related to GI and procedure were reviewed by me with the patient prior to the procedure   Current Facility-Administered Medications:    0.9 %  sodium chloride infusion, , Intravenous, Continuous, Jaynie Collins, DO, Last Rate: 20 mL/hr at 12/24/22 1246, New Bag at 12/24/22 1246  sodium chloride 20 mL/hr at 12/24/22 1246       No Active Allergies Allergies were reviewed by me prior to the procedure  Objective   Body mass index is 22.82 kg/m. Vitals:   12/24/22 1235  BP: 120/67  Pulse: 69  Resp: 18  Temp: (!) 97.2 F (36.2 C)  TempSrc: Temporal  SpO2: 99%  Weight: 58.4 kg  Height: 5\' 3"  (1.6 m)     Physical Exam Vitals and nursing note reviewed.  Constitutional:  General: She is not in acute distress.    Appearance: Normal appearance. She is not ill-appearing, toxic-appearing or diaphoretic.  HENT:     Head: Normocephalic and atraumatic.     Nose: Nose normal.     Mouth/Throat:     Mouth: Mucous membranes are moist.     Pharynx: Oropharynx is clear.  Eyes:     General: No scleral icterus.    Extraocular Movements: Extraocular movements intact.  Cardiovascular:     Rate and Rhythm: Normal rate and regular rhythm.     Heart sounds: Normal heart sounds. No murmur heard.    No friction rub. No gallop.  Pulmonary:      Effort: Pulmonary effort is normal. No respiratory distress.     Breath sounds: Normal breath sounds. No wheezing, rhonchi or rales.  Abdominal:     General: Bowel sounds are normal. There is no distension.     Palpations: Abdomen is soft.     Tenderness: There is no abdominal tenderness. There is no guarding or rebound.  Musculoskeletal:     Cervical back: Neck supple.     Right lower leg: No edema.     Left lower leg: No edema.  Skin:    General: Skin is warm and dry.     Coloration: Skin is not jaundiced or pale.  Neurological:     General: No focal deficit present.     Mental Status: She is alert and oriented to person, place, and time. Mental status is at baseline.  Psychiatric:        Mood and Affect: Mood normal.        Behavior: Behavior normal.        Thought Content: Thought content normal.        Judgment: Judgment normal.      Assessment:  Lindsay Mejia is a 63 y.o. female  who presents today for Colonoscopy for Colorectal cancer screening .  Plan:  Colonoscopy with possible intervention today  Colonoscopy with possible biopsy, control of bleeding, polypectomy, and interventions as necessary has been discussed with the patient/patient representative. Informed consent was obtained from the patient/patient representative after explaining the indication, nature, and risks of the procedure including but not limited to death, bleeding, perforation, missed neoplasm/lesions, cardiorespiratory compromise, and reaction to medications. Opportunity for questions was given and appropriate answers were provided. Patient/patient representative has verbalized understanding is amenable to undergoing the procedure.   Jaynie Collins, DO  [facility] Gastroenter[facility]  Portions of the record may have been created with voice recognition software. Occasional wrong-word or 'sound-a-like' substitutions may have occurred due to the inherent limitations of voice recognition  software.  Read the chart carefully and recognize, using context, where substitutions may have occurred.

## 2022-12-24 NOTE — Interval H&P Note (Signed)
History and Physical Interval Note: Preprocedure H&P from 12/24/22  was reviewed and there was no interval change after seeing and examining the patient.  Written consent was obtained from the patient after discussion of risks, benefits, and alternatives. Patient has consented to proceed with Colonoscopy with possible intervention   12/24/2022 1:30 PM  Lindsay Mejia KENNEDY LEBECK  has presented today for surgery, with the diagnosis of Z12.11 (ICD-10-CM) - Colon cancer screening Z83.719 (ICD-10-CM) - Family hx colonic polyps.  The various methods of treatment have been discussed with the patient and family. After consideration of risks, benefits and other options for treatment, the patient has consented to  Procedure(s): COLONOSCOPY WITH PROPOFOL (N/A) as a surgical intervention.  The patient's history has been reviewed, patient examined, no change in status, stable for surgery.  I have reviewed the patient's chart and labs.  Questions were answered to the patient's satisfaction.     Lindsay Mejia

## 2022-12-24 NOTE — Op Note (Signed)
[facility]enter[facility] Patient Name: Lindsay Mejia Procedure Date: 12/24/2022 1:27 PM MRN: [id number] Account #: [id number] Date of Birth: 06-16-1960 Admit Type: Outpatient Age: 62 Room: [facility] ENDO ROOM 2 Gender: Female Note Status: Supervisor Override Instrument Name: Peds Colonoscope [id number] Procedure:             Colonoscopy Indications:           Screening for colorectal malignant neoplasm, Colon                         cancer screening in patient at increased risk: Family                         history of 1st-degree relative with colon polyps Providers:             Jaynie Collins DO, DO Medicines:             Monitored Anesthesia Care Complications:         No immediate complications. Estimated blood loss:                         Minimal. Procedure:             Pre-Anesthesia Assessment:                        - Prior to the procedure, a History and Physical was                         performed, and patient medications and allergies were                         reviewed. The patient is competent. The risks and                         benefits of the procedure and the sedation options and                         risks were discussed with the patient. All questions                         were answered and informed consent was obtained.                         Patient identification and proposed procedure were                         verified by the physician, the nurse, the anesthetist                         and the technician in the endoscopy suite. Mental                         Status Examination: alert and oriented. Airway                         Examination: normal oropharyngeal airway and neck                         mobility. Respiratory Examination:  clear to                         auscultation. CV Examination: RRR, no murmurs, no S3                         or S4. Prophylactic Antibiotics: The patient does not                          require prophylactic antibiotics. Prior                         Anticoagulants: The patient has taken no anticoagulant                         or antiplatelet agents. ASA Grade Assessment: II - A                         patient with mild systemic disease. After reviewing                         the risks and benefits, the patient was deemed in                         satisfactory condition to undergo the procedure. The                         anesthesia plan was to use monitored anesthesia care                         (MAC). Immediately prior to administration of                         medications, the patient was re-assessed for adequacy                         to receive sedatives. The heart rate, respiratory                         rate, oxygen saturations, blood pressure, adequacy of                         pulmonary ventilation, and response to care were                         monitored throughout the procedure. The physical                         status of the patient was re-assessed after the                         procedure.                        After obtaining informed consent, the colonoscope was                         passed under direct vision. Throughout the procedure,  the patient's blood pressure, pulse, and oxygen                         saturations were monitored continuously. The                         Colonoscope was introduced through the anus and                         advanced to the the terminal ileum, with                         identification of the appendiceal orifice and IC                         valve. The colonoscopy was performed without                         difficulty. The patient tolerated the procedure well.                         The quality of the bowel preparation was evaluated                         using the BBPS [facility] Bowel Preparation Scale) with                         scores of: Right Colon = 3, Transverse  Colon = 3 and                         Left Colon = 3 (entire mucosa seen well with no                         residual staining, small fragments of stool or opaque                         liquid). The total BBPS score equals 9. The terminal                         ileum, ileocecal valve, appendiceal orifice, and                         rectum were photographed. Findings:      The perianal and digital rectal examinations were normal. Pertinent       negatives include normal sphincter tone.      The terminal ileum appeared normal. Estimated blood loss: none.      Retroflexion in the right colon was performed.      Anal papilla(e) were hypertrophied. Estimated blood loss: none.      The entire examined colon appeared normal on direct and retroflexion       views. Impression:            - The examined portion of the ileum was normal.                        - Anal papilla(e) were hypertrophied.                        -  The entire examined colon is normal on direct and                         retroflexion views.                        - No specimens collected. Recommendation:        - Patient has a contact number available for                         emergencies. The signs and symptoms of potential                         delayed complications were discussed with the patient.                         Return to normal activities tomorrow. Written                         discharge instructions were provided to the patient.                        - Discharge patient to home.                        - Resume previous diet.                        - Continue present medications.                        - Repeat colonoscopy 5 years if mother with advanced                         polyps or <60 y/o. Otherwise 7 years for screening                         purposes.                        - Return to referring physician as previously                         scheduled.                        - The  findings and recommendations were discussed with                         the patient. Procedure Code(s):     --- Professional ---                        [phone], Colonoscopy, flexible; diagnostic, including                         collection of specimen(s) by brushing or washing, when                         performed (separate procedure) Diagnosis Code(s):     --- Professional ---  Z12.11, Encounter for screening for malignant neoplasm                         of colon                        K62.89, Other specified diseases of anus and rectum CPT copyright 2022 American Medical Association. All rights reserved. The codes documented in this report are preliminary and upon coder review may  be revised to meet current compliance requirements. Attending Participation:      I personally performed the entire procedure. Elfredia Nevins, DO Jaynie Collins DO, DO 12/24/2022 2:09:45 PM This report has been signed electronically. Number of Addenda: 0 Note Initiated On: 12/24/2022 1:27 PM Scope Withdrawal Time: 0 hours 11 minutes 59 seconds  Total Procedure Duration: 0 hours 18 minutes 28 seconds  Estimated Blood Loss:  Estimated blood loss was minimal.      [facility]

## 2022-12-24 NOTE — Group Note (Deleted)

## 2022-12-25 ENCOUNTER — Encounter: Payer: Self-pay | Admitting: Gastroenterology

## 2022-12-25 NOTE — Anesthesia Postprocedure Evaluation (Signed)
Anesthesia Post Note  Patient: DESTYN WADDY  Procedure(s) Performed: COLONOSCOPY WITH PROPOFOL  Patient location during evaluation: Endoscopy Anesthesia Type: General Level of consciousness: awake and alert Pain management: pain level controlled Vital Signs Assessment: post-procedure vital signs reviewed and stable Respiratory status: spontaneous breathing, nonlabored ventilation, respiratory function stable and patient connected to nasal cannula oxygen Cardiovascular status: blood pressure returned to baseline and stable Postop Assessment: no apparent nausea or vomiting Anesthetic complications: no   No notable events documented.   Last Vitals:  Vitals:   12/24/22 1426 12/24/22 1436  BP: 98/63   Pulse: 67   Resp:    Temp:    SpO2: 96% 99%    Last Pain:  Vitals:   12/25/22 0735  TempSrc:   PainSc: 0-No pain                 Corinda Gubler

## 2023-01-25 ENCOUNTER — Other Ambulatory Visit: Payer: Self-pay | Admitting: Family

## 2023-01-25 DIAGNOSIS — E119 Type 2 diabetes mellitus without complications: Secondary | ICD-10-CM

## 2023-02-07 ENCOUNTER — Encounter: Payer: Self-pay | Admitting: Family

## 2023-02-07 ENCOUNTER — Ambulatory Visit: Payer: 59 | Admitting: Family

## 2023-02-07 VITALS — BP 118/72 | HR 68 | Temp 97.9°F | Wt 128.4 lb

## 2023-02-07 DIAGNOSIS — F419 Anxiety disorder, unspecified: Secondary | ICD-10-CM | POA: Diagnosis not present

## 2023-02-07 DIAGNOSIS — B001 Herpesviral vesicular dermatitis: Secondary | ICD-10-CM

## 2023-02-07 DIAGNOSIS — F32A Depression, unspecified: Secondary | ICD-10-CM

## 2023-02-07 DIAGNOSIS — E119 Type 2 diabetes mellitus without complications: Secondary | ICD-10-CM | POA: Diagnosis not present

## 2023-02-07 LAB — POCT GLYCOSYLATED HEMOGLOBIN (HGB A1C): Hemoglobin A1C: 5 % (ref 4.0–5.6)

## 2023-02-07 MED ORDER — VALACYCLOVIR HCL 1 G PO TABS
ORAL_TABLET | ORAL | 1 refills | Status: DC
Start: 2023-02-07 — End: 2024-02-20

## 2023-02-07 NOTE — Progress Notes (Signed)
Assessment & Plan:  Controlled type 2 diabetes mellitus without complication, without long-term current use of insulin [facility]) Assessment & Plan: Lab Results  Component Value Date   HGBA1C 5.0 02/07/2023   Excellent control.  Continue Trulicity 3 mg for diabetic control, weight maintenance and patient preference.     Orders: -     POCT glycosylated hemoglobin (Hb A1C)  Herpes labialis -     valACYclovir HCl; TAKE 2 TABLETS(2000 MG) BY MOUTH EVERY 12 HOURS FOR 1 DAY. START: ASAP AFTER SYMPTOM ONSET  Dispense: 30 tablet; Refill: 1  Anxiety and depression Assessment & Plan: Chronic, stable.  Continue Lexapro 10 mg      Return precautions given.   Risks, benefits, and alternatives of the medications and treatment plan prescribed today were discussed, and patient expressed understanding.   Education regarding symptom management and diagnosis given to patient on AVS either electronically or printed.  Return in about 9 months (around 11/07/2023).  Rennie Plowman, FNP  Subjective:    Patient ID: Lindsay Mejia, female    DOB: [birth year], 62 y.o.   MRN: [id number]  CC: Lindsay Mejia is a 62 y.o. female who presents today for follow up.   HPI: Feels well today.  No new complaints.  Requests refill of Valtrex.  She remains compliant with Trulicity 3 mg weekly     Allergies: Patient has no active allergies. Current Outpatient Medications on File Prior to Visit  Medication Sig Dispense Refill   escitalopram (LEXAPRO) 10 MG tablet TAKE 1 TABLET BY MOUTH DAILY 30 tablet 3   levothyroxine (SYNTHROID) 75 MCG tablet TAKE ONE TABLET BY MOUTH EVERY MORNING 90 tablet 2   lisinopril-hydrochlorothiazide (ZESTORETIC) 10-12.5 MG tablet TAKE 1 TABLET BY MOUTH DAILY 30 tablet 3   Multiple Vitamin (MULTIVITAMIN) tablet Take 1 tablet by mouth daily.     rosuvastatin (CRESTOR) 10 MG tablet TAKE 1 TABLET BY MOUTH DAILY 30 tablet 3   TRULICITY 3 MG/0.5ML SOPN INJECT 3 MG UNDER THE SKIN ONCE  WEEKLY 2 mL 3   Current Facility-Administered Medications on File Prior to Visit  Medication Dose Route Frequency Provider Last Rate Last Admin   betamethasone acetate-betamethasone sodium phosphate (CELESTONE) injection 3 mg  3 mg Intramuscular Once Felecia Shelling, DPM        Review of Systems  Constitutional:  Negative for chills and fever.  Respiratory:  Negative for cough.   Cardiovascular:  Negative for chest pain and palpitations.  Gastrointestinal:  Negative for nausea and vomiting.      Objective:    BP 118/72   Pulse 68   Temp 97.9 F (36.6 C) (Temporal)   Wt 128 lb 6.4 oz (58.2 kg)   SpO2 97%   BMI 22.75 kg/m  BP Readings from Last 3 Encounters:  02/07/23 118/72  12/24/22 98/63  08/14/22 118/70   Wt Readings from Last 3 Encounters:  02/07/23 128 lb 6.4 oz (58.2 kg)  12/24/22 128 lb 12.8 oz (58.4 kg)  08/14/22 133 lb 9.6 oz (60.6 kg)    Physical Exam Vitals reviewed.  Constitutional:      Appearance: She is well-developed.  Eyes:     Conjunctiva/sclera: Conjunctivae normal.  Cardiovascular:     Rate and Rhythm: Normal rate and regular rhythm.     Pulses: Normal pulses.     Heart sounds: Normal heart sounds.  Pulmonary:     Effort: Pulmonary effort is normal.     Breath sounds: Normal breath sounds. No wheezing,  rhonchi or rales.  Skin:    General: Skin is warm and dry.  Neurological:     Mental Status: She is alert.  Psychiatric:        Speech: Speech normal.        Behavior: Behavior normal.        Thought Content: Thought content normal.

## 2023-02-11 ENCOUNTER — Ambulatory Visit: Payer: 59 | Admitting: Family

## 2023-02-12 ENCOUNTER — Other Ambulatory Visit: Payer: Self-pay | Admitting: Family

## 2023-02-12 DIAGNOSIS — I10 Essential (primary) hypertension: Secondary | ICD-10-CM

## 2023-02-12 DIAGNOSIS — F32A Depression, unspecified: Secondary | ICD-10-CM

## 2023-02-12 DIAGNOSIS — E119 Type 2 diabetes mellitus without complications: Secondary | ICD-10-CM

## 2023-02-12 NOTE — Assessment & Plan Note (Signed)
Lab Results  Component Value Date   HGBA1C 5.0 02/07/2023   Excellent control.  Continue Trulicity 3 mg for diabetic control, weight maintenance and patient preference.

## 2023-02-12 NOTE — Assessment & Plan Note (Signed)
Chronic, stable  Continue Lexapro 10 mg

## 2023-02-13 ENCOUNTER — Ambulatory Visit: Payer: 59 | Admitting: Family

## 2023-03-05 NOTE — Telephone Encounter (Signed)
Error

## 2023-03-15 ENCOUNTER — Encounter: Payer: Self-pay | Admitting: Emergency Medicine

## 2023-03-15 ENCOUNTER — Ambulatory Visit
Admission: EM | Admit: 2023-03-15 | Discharge: 2023-03-15 | Disposition: A | Discharge status: Home / Self Care | Payer: 59 | Attending: Emergency Medicine | Admitting: Emergency Medicine

## 2023-03-15 ENCOUNTER — Other Ambulatory Visit: Payer: Self-pay

## 2023-03-15 DIAGNOSIS — J0100 Acute maxillary sinusitis, unspecified: Secondary | ICD-10-CM

## 2023-03-15 MED ORDER — AMOXICILLIN-POT CLAVULANATE 875-125 MG PO TABS: 1.0000 | ORAL_TABLET | Freq: Two times a day (BID) | ORAL | 0 refills | Status: DC

## 2023-03-15 NOTE — Discharge Instructions (Signed)
Take the Augmentin as directed.  Follow up with your primary care provider if your symptoms are not improving.

## 2023-03-15 NOTE — ED Provider Notes (Signed)
[name]    CSN: [id number] Arrival date & time: 03/15/23  1478      History   Chief Complaint Chief Complaint  Patient presents with   URI    HPI Lindsay Mejia is a 61 y.o. female.  Patient presents with 3-week history of cough, sinus pressure, congestion, postnasal drip.  Treating with Mucinex and Robitussin.  No fever, shortness of breath, or other symptoms.  Her medical history includes diabetes and hypertension.  The history is provided by the patient and medical records.    Past Medical History:  Diagnosis Date   Anxiety    Diabetes mellitus without complication (HCC)    Hyperlipidemia    Hypertension    Hypothyroidism    Rosacea    Dr. Wende Neighbors   Thyroid disease 2000   s/p XRT to thyroid, Dr. Hyacinth Meeker at [facility]    Patient Active Problem List   Diagnosis Date Noted   Melanoma of skin (HCC) 12/12/2021   Vertigo 10/15/2018   Chest tightness 04/11/2018   Anxiety and depression 07/26/2017   HLD (hyperlipidemia) 07/25/2016   Herpes labialis 04/18/2016   Rosacea 09/16/2012   Overweight (BMI 25.0-29.9) 08/06/2012   Diabetes type 2, controlled (HCC) 06/26/2012   Essential hypertension, benign 06/26/2012   Hypothyroidism 09/03/2011    Past Surgical History:  Procedure Laterality Date   ABDOMINAL HYSTERECTOMY     has one ovary    APPENDECTOMY     BREAST BIOPSY Right 2006   neg   BREAST SURGERY     CESAREAN SECTION     4, Dr. Weston Anna   COLONOSCOPY WITH PROPOFOL N/A 12/24/2022   Procedure: COLONOSCOPY WITH PROPOFOL;  Surgeon: Jaynie Collins, DO;  Location: [facility];  Service: Gastroenterology;  Laterality: N/A;   HERNIA REPAIR     abdominal   KNEE ARTHROSCOPY W/ MENISCAL REPAIR     2    OB History     Gravida  4   Para  4   Term      Preterm      AB      Living         SAB      IAB      Ectopic      Multiple      Live Births               Home Medications    Prior to Admission medications    Medication Sig Start Date End Date Taking? Authorizing Provider  amoxicillin-clavulanate (AUGMENTIN) 875-125 MG tablet Take 1 tablet by mouth every 12 (twelve) hours. 03/15/23  Yes Mickie Bail, NP  escitalopram (LEXAPRO) 10 MG tablet TAKE 1 TABLET BY MOUTH DAILY 02/12/23   Allegra Grana, FNP  levothyroxine (SYNTHROID) 75 MCG tablet TAKE ONE TABLET BY MOUTH EVERY MORNING 10/15/22   Allegra Grana, FNP  lisinopril-hydrochlorothiazide (ZESTORETIC) 10-12.5 MG tablet TAKE 1 TABLET BY MOUTH DAILY 02/12/23   Allegra Grana, FNP  Multiple Vitamin (MULTIVITAMIN) tablet Take 1 tablet by mouth daily.    [provider]  rosuvastatin (CRESTOR) 10 MG tablet TAKE 1 TABLET BY MOUTH DAILY 02/12/23   Allegra Grana, FNP  TRULICITY 3 MG/0.5ML SOPN INJECT 3 MG UNDER THE SKIN ONCE WEEKLY 01/25/23   Allegra Grana, FNP  valACYclovir (VALTREX) 1000 MG tablet TAKE 2 TABLETS(2000 MG) BY MOUTH EVERY 12 HOURS FOR 1 DAY. START: ASAP AFTER SYMPTOM ONSET 02/07/23   Allegra Grana, FNP  Family History Family History  Problem Relation Age of Onset   Hyperlipidemia Mother    Hypertension Mother    Breast cancer Mother 16   Colon polyps Mother    Hypertension Father    Heart disease Father    Heart attack Father 62   Hypertension Brother    Breast cancer Paternal Grandmother        later in life   Colon cancer Neg Hx    Ovarian cancer Neg Hx    Thyroid cancer Neg Hx     Social History Social History   Tobacco Use   Smoking status: Never   Smokeless tobacco: Never  Vaping Use   Vaping status: Never Used  Substance Use Topics   Alcohol use: Yes    Comment: occasionally   Drug use: No     Allergies   Patient has no known allergies.   Review of Systems Review of Systems  Constitutional:  Negative for chills and fever.  HENT:  Positive for congestion, postnasal drip, rhinorrhea and sinus pressure. Negative for ear pain and sore throat.   Respiratory:  Positive for  cough. Negative for shortness of breath.   Cardiovascular:  Negative for chest pain and palpitations.     Physical Exam Triage Vital Signs ED Triage Vitals  Encounter Vitals Group     BP 03/15/23 0859 111/61     Systolic BP Percentile --      Diastolic BP Percentile --      Pulse Rate 03/15/23 0859 70     Resp 03/15/23 0859 18     Temp 03/15/23 0859 99.1 F (37.3 C)     Temp Source 03/15/23 0859 Oral     SpO2 03/15/23 0859 98 %     Weight --      Height --      Head Circumference --      Peak Flow --      Pain Score 03/15/23 0857 0     Pain Loc --      Pain Education --      Exclude from Growth Chart --    No data found.  Updated Vital Signs BP 111/61 (BP Location: Left Arm)   Pulse 70   Temp 99.1 F (37.3 C) (Oral)   Resp 18   SpO2 98%   Visual Acuity Right Eye Distance:   Left Eye Distance:   Bilateral Distance:    Right Eye Near:   Left Eye Near:    Bilateral Near:     Physical Exam Constitutional:      General: She is not in acute distress. HENT:     Right Ear: Tympanic membrane normal.     Left Ear: Tympanic membrane normal.     Nose: Congestion and rhinorrhea present.     Mouth/Throat:     Mouth: Mucous membranes are moist.     Pharynx: Oropharynx is clear.  Cardiovascular:     Rate and Rhythm: Normal rate and regular rhythm.     Heart sounds: Normal heart sounds.  Pulmonary:     Effort: Pulmonary effort is normal. No respiratory distress.     Breath sounds: Normal breath sounds.  Skin:    General: Skin is warm and dry.  Neurological:     Mental Status: She is alert.      UC Treatments / Results  Labs (all labs ordered are listed, but only abnormal results are displayed) Labs Reviewed - No data to display  EKG  Radiology No results found.  Procedures Procedures (including critical care time)  Medications Ordered in UC Medications - No data to display  Initial Impression / Assessment and Plan / UC Course  I have reviewed  the triage vital signs and the nursing notes.  Pertinent labs & imaging results that were available during my care of the patient were reviewed by me and considered in my medical decision making (see chart for details).    Acute sinusitis.  Patient has been symptomatic for 3 weeks.  Treating today with Augmentin.  Tylenol or ibuprofen as needed.  Plain Mucinex as needed.  Instructed patient to follow up with her PCP if her symptoms are not improving.  She agrees to plan of care.   Final Clinical Impressions(s) / UC Diagnoses   Final diagnoses:  Acute non-recurrent maxillary sinusitis     Discharge Instructions      Take the Augmentin as directed.  Follow-up with your primary care provider if your symptoms are not improving.     ED Prescriptions     Medication Sig Dispense Auth. Provider   amoxicillin-clavulanate (AUGMENTIN) 875-125 MG tablet Take 1 tablet by mouth every 12 (twelve) hours. 14 tablet Mickie Bail, NP      PDMP not reviewed this encounter.   Mickie Bail, NP 03/15/23 [phone]

## 2023-03-15 NOTE — ED Triage Notes (Signed)
Symptoms started 3 weeks.  Patient complains of cough, throat drainage.  Coughing up very little phlegm, but cough is loose sounding in chest.    Has had mucinex, robitussin.

## 2023-04-18 DIAGNOSIS — Z85828 Personal history of other malignant neoplasm of skin: Secondary | ICD-10-CM | POA: Diagnosis not present

## 2023-04-18 DIAGNOSIS — D2261 Melanocytic nevi of right upper limb, including shoulder: Secondary | ICD-10-CM | POA: Diagnosis not present

## 2023-04-18 DIAGNOSIS — D225 Melanocytic nevi of trunk: Secondary | ICD-10-CM | POA: Diagnosis not present

## 2023-04-18 DIAGNOSIS — D2272 Melanocytic nevi of left lower limb, including hip: Secondary | ICD-10-CM | POA: Diagnosis not present

## 2023-04-18 DIAGNOSIS — D2262 Melanocytic nevi of left upper limb, including shoulder: Secondary | ICD-10-CM | POA: Diagnosis not present

## 2023-04-18 DIAGNOSIS — Z85820 Personal history of malignant melanoma of skin: Secondary | ICD-10-CM | POA: Diagnosis not present

## 2023-04-28 NOTE — Progress Notes (Signed)
 PCP: Dineen Rollene MATSU, FNP   Chief Complaint  Patient presents with   Gynecologic Exam    No concerns    HPI:      Lindsay Mejia is a 63 y.o. G4P4 whose LMP was No LMP recorded. Patient has had a hysterectomy., presents today for her annual examination.  Her menses are absent due to TAH and 1 ovary removed (although doesn't know which one) due to leio.  She does not have PMB. She does not have vasomotor sx.   Sex activity: currently sexually active again. Had some vaginal dryness, tinge of pink blood afterwards, mild pain. Didn't use lubricants.  Last Pap: 01/11/20 Results were normal/neg HPV DNA; no longer indicated due to TAH; hx of abn pap in past with neg repeat. Hx of STDs: none  Last mammogram: 12/06/22 with PCP; Results were: normal--routine follow-up in 12 months There is a FH of breast cancer in her mom and PGM, genetic testing not indicated. There is no FH of ovarian cancer. The patient does do self-breast exams.   Colonoscopy: 11/2017 and 9/24 at [facility];  Repeat due after 5 years. FH colon polyps  Tobacco use: The patient denies current or previous tobacco use. Alcohol use: none  No drug use Exercise: moderately active  She does get adequate calcium  but not Vitamin D  in her diet. Had to stop Vit D supp due to elevated levels in 2021 (was getting some in multiple supplements), WNL 2022, no supp or labs since.  Labs with PCP.   Past Medical History:  Diagnosis Date   Anxiety    Diabetes mellitus without complication (HCC)    Hyperlipidemia    Hypertension    Hypothyroidism    Rosacea    Dr. Klineman   Thyroid  disease 2000   s/p XRT to thyroid , Dr. Cleotilde at [place]    Past Surgical History:  Procedure Laterality Date   ABDOMINAL HYSTERECTOMY     has one ovary    APPENDECTOMY     BREAST BIOPSY Right 2006   neg   BREAST SURGERY     CESAREAN SECTION     4, Dr. Bethene   COLONOSCOPY WITH PROPOFOL  N/A 12/24/2022   Procedure: COLONOSCOPY WITH  PROPOFOL ;  Surgeon: Onita Elspeth Sharper, DO;  Location: [facility] ENDOSCOPY;  Service: Gastroenterology;  Laterality: N/A;   HERNIA REPAIR     abdominal   KNEE ARTHROSCOPY W/ MENISCAL REPAIR     2    Family History  Problem Relation Age of Onset   Hyperlipidemia Mother    Hypertension Mother    Breast cancer Mother 55   Colon polyps Mother    Hypertension Father    Heart disease Father    Heart attack Father 12   Hypertension Brother    Breast cancer Paternal Grandmother        later in life   Colon cancer Neg Hx    Ovarian cancer Neg Hx    Thyroid  cancer Neg Hx     Social History   Socioeconomic History   Marital status: Married    Spouse name: Not on file   Number of children: Not on file   Years of education: Not on file   Highest education level: Some college, no degree  Occupational History   Not on file  Tobacco Use   Smoking status: Never   Smokeless tobacco: Never  Vaping Use   Vaping status: Never Used  Substance and Sexual Activity   Alcohol use:  Yes    Comment: occasionally   Drug use: No   Sexual activity: Yes    Birth control/protection: Surgical    Comment: Hysterectomy  Other Topics Concern   Not on file  Social History Narrative   Lives in [place]. Has 4 children. 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    She has grandchildren      substitute teaching in the fall 2024            Social Drivers of Health   Financial Resource Strain: Low Risk  (08/13/2022)   Overall Financial Resource Strain (CARDIA)    Difficulty of Paying Living Expenses: Not hard at all  Food Insecurity: No Food Insecurity (08/13/2022)   Hunger Vital Sign    Worried About Running Out of Food in the Last Year: Never true    Ran Out of Food in the Last Year: Never true  Transportation Needs: No Transportation Needs (08/13/2022)   PRAPARE - Administrator, Civil Service (Medical): No    Lack of Transportation (Non-Medical): No  Physical Activity:  Insufficiently Active (08/13/2022)   Exercise Vital Sign    Days of Exercise per Week: 3 days    Minutes of Exercise per Session: 20 min  Stress: No Stress Concern Present (08/13/2022)   [employer] of Occupational Health - Occupational Stress Questionnaire    Feeling of Stress : Not at all  Social Connections: Moderately Integrated (08/13/2022)   Social Connection and Isolation Panel [NHANES]    Frequency of Communication with Friends and Family: More than three times a week    Frequency of Social Gatherings with Friends and Family: More than three times a week    Attends Religious Services: More than 4 times per year    Active Member of [employer] or Organizations: Yes    Attends Banker Meetings: More than 4 times per year    Marital Status: Widowed  Catering Manager Violence: Not on file     Current Outpatient Medications:    escitalopram  (LEXAPRO ) 10 MG tablet, TAKE 1 TABLET BY MOUTH DAILY, Disp: 30 tablet, Rfl: 3   levothyroxine  (SYNTHROID ) 75 MCG tablet, TAKE ONE TABLET BY MOUTH EVERY MORNING, Disp: 90 tablet, Rfl: 2   lisinopril -hydrochlorothiazide  (ZESTORETIC ) 10-12.5 MG tablet, TAKE 1 TABLET BY MOUTH DAILY, Disp: 30 tablet, Rfl: 3   Multiple Vitamin (MULTIVITAMIN) tablet, Take 1 tablet by mouth daily., Disp: , Rfl:    rosuvastatin  (CRESTOR ) 10 MG tablet, TAKE 1 TABLET BY MOUTH DAILY, Disp: 30 tablet, Rfl: 3   TRULICITY  3 MG/0.5ML SOPN, INJECT 3 MG UNDER THE SKIN ONCE WEEKLY, Disp: 2 mL, Rfl: 3   valACYclovir  (VALTREX ) 1000 MG tablet, TAKE 2 TABLETS(2000 MG) BY MOUTH EVERY 12 HOURS FOR 1 DAY. START: ASAP AFTER SYMPTOM ONSET, Disp: 30 tablet, Rfl: 1  Current Facility-Administered Medications:    betamethasone  acetate-betamethasone  sodium phosphate (CELESTONE ) injection 3 mg, 3 mg, Intramuscular, Once, Evans, Brent M, DPM     ROS:  Review of Systems  Constitutional:  Negative for fatigue, fever and unexpected weight change.  Respiratory:  Negative for cough,  shortness of breath and wheezing.   Cardiovascular:  Negative for chest pain, palpitations and leg swelling.  Gastrointestinal:  Negative for blood in stool, constipation, diarrhea, nausea and vomiting.  Endocrine: Negative for cold intolerance, heat intolerance and polyuria.  Genitourinary:  Negative for dyspareunia, dysuria, flank pain, frequency, genital sores, hematuria, menstrual problem, pelvic pain, urgency, vaginal bleeding, vaginal discharge and vaginal  pain.  Musculoskeletal:  Negative for back pain, joint swelling and myalgias.  Skin:  Negative for rash.  Neurological:  Negative for dizziness, syncope, light-headedness, numbness and headaches.  Hematological:  Negative for adenopathy.  Psychiatric/Behavioral:  Negative for agitation, confusion, sleep disturbance and suicidal ideas. The patient is not nervous/anxious.   BREAST: No symptoms    Objective: BP (!) 157/63   Pulse 70   Ht 5' 2.5 (1.588 m)   Wt 127 lb (57.6 kg)   BMI 22.86 kg/m    Physical Exam Constitutional:      Appearance: She is well-developed.  Genitourinary:     Vulva normal.     Genitourinary Comments: UTERUS/CX SURG REM     Right Labia: No rash, tenderness or lesions.    Left Labia: No tenderness, lesions or rash.    Vaginal cuff intact.    No vaginal discharge, erythema or tenderness.     Mild vaginal atrophy present.     Right Adnexa: not tender and no mass present.    Left Adnexa: not tender and no mass present.    Cervix is absent.     Uterus is absent.  Breasts:    Right: No mass, nipple discharge, skin change or tenderness.     Left: No mass, nipple discharge, skin change or tenderness.  Neck:     Thyroid : No thyromegaly.  Cardiovascular:     Rate and Rhythm: Normal rate and regular rhythm.     Heart sounds: Normal heart sounds. No murmur heard. Pulmonary:     Effort: Pulmonary effort is normal.     Breath sounds: Normal breath sounds.  Abdominal:     Palpations: Abdomen is soft.      Tenderness: There is no abdominal tenderness. There is no guarding.  Musculoskeletal:        General: Normal range of motion.     Cervical back: Normal range of motion.  Neurological:     General: No focal deficit present.     Mental Status: She is alert and oriented to person, place, and time.     Cranial Nerves: No cranial nerve deficit.  Skin:    General: Skin is warm and dry.  Psychiatric:        Mood and Affect: Mood normal.        Behavior: Behavior normal.        Thought Content: Thought content normal.        Judgment: Judgment normal.  Vitals reviewed.     Assessment/Plan: Encounter for annual routine gynecological examination  Encounter for screening mammogram for malignant neoplasm of breast; pt current on mammo  Vaginal atrophy - Plan: estradiol  (ESTRACE  VAGINAL) 0.1 MG/GM vaginal cream; Rx vag ERT.   Dyspareunia in female - Plan: estradiol  (ESTRACE  VAGINAL) 0.1 MG/GM vaginal cream; add vag ERT, use lubricants prn   Meds ordered this encounter  Medications   estradiol  (ESTRACE  VAGINAL) 0.1 MG/GM vaginal cream    Sig: Insert 1 g vaginally nightly for 1 week, then once wkly as maintenance    Dispense:  42.5 g    Refill:  1    Supervising Provider:   CHERRY, ANIKA [[id number]]          GYN counsel breast self exam, menopause, adequate intake of calcium  and vitamin D , diet and exercise    F/U  Return in about 1 year (around 04/28/2024).  Moxon Messler B. Keerthana Vanrossum, PA-C 04/29/2023 8:43 AM

## 2023-04-29 ENCOUNTER — Encounter: Payer: Self-pay | Admitting: Obstetrics and Gynecology

## 2023-04-29 ENCOUNTER — Ambulatory Visit (INDEPENDENT_AMBULATORY_CARE_PROVIDER_SITE_OTHER): Payer: 59 | Admitting: Obstetrics and Gynecology

## 2023-04-29 VITALS — BP 104/63 | HR 66 | Ht 62.5 in | Wt 127.0 lb

## 2023-04-29 DIAGNOSIS — Z1211 Encounter for screening for malignant neoplasm of colon: Secondary | ICD-10-CM

## 2023-04-29 DIAGNOSIS — Z01419 Encounter for gynecological examination (general) (routine) without abnormal findings: Secondary | ICD-10-CM

## 2023-04-29 DIAGNOSIS — N952 Postmenopausal atrophic vaginitis: Secondary | ICD-10-CM

## 2023-04-29 DIAGNOSIS — N941 Unspecified dyspareunia: Secondary | ICD-10-CM

## 2023-04-29 DIAGNOSIS — Z1231 Encounter for screening mammogram for malignant neoplasm of breast: Secondary | ICD-10-CM

## 2023-04-29 MED ORDER — ESTRADIOL 0.1 MG/GM VA CREA
TOPICAL_CREAM | VAGINAL | 1 refills | Status: AC
Start: 2023-04-29 — End: ?

## 2023-04-29 NOTE — Patient Instructions (Signed)
 I value your feedback and you entrusting [place] with your care. If you get a [place] patient survey, I would appreciate you taking the time to let us know about your experience today. Thank you! ? ? ?

## 2023-05-07 ENCOUNTER — Encounter: Payer: Self-pay | Admitting: Obstetrics and Gynecology

## 2023-05-10 ENCOUNTER — Ambulatory Visit: Payer: 59

## 2023-05-16 ENCOUNTER — Other Ambulatory Visit: Payer: Self-pay | Admitting: Family

## 2023-05-16 DIAGNOSIS — E119 Type 2 diabetes mellitus without complications: Secondary | ICD-10-CM

## 2023-06-05 DIAGNOSIS — L820 Inflamed seborrheic keratosis: Secondary | ICD-10-CM | POA: Diagnosis not present

## 2023-06-13 ENCOUNTER — Other Ambulatory Visit: Payer: Self-pay | Admitting: Family

## 2023-06-13 DIAGNOSIS — I10 Essential (primary) hypertension: Secondary | ICD-10-CM

## 2023-06-13 DIAGNOSIS — F32A Depression, unspecified: Secondary | ICD-10-CM

## 2023-06-13 DIAGNOSIS — E119 Type 2 diabetes mellitus without complications: Secondary | ICD-10-CM

## 2023-06-19 ENCOUNTER — Other Ambulatory Visit: Payer: Self-pay

## 2023-06-19 DIAGNOSIS — E119 Type 2 diabetes mellitus without complications: Secondary | ICD-10-CM

## 2023-06-19 DIAGNOSIS — I10 Essential (primary) hypertension: Secondary | ICD-10-CM

## 2023-06-19 MED ORDER — LISINOPRIL-HYDROCHLOROTHIAZIDE 10-12.5 MG PO TABS
1.0000 | ORAL_TABLET | Freq: Every day | ORAL | 3 refills | Status: DC
Start: 2023-06-19 — End: 2023-10-14

## 2023-06-19 MED ORDER — ROSUVASTATIN CALCIUM 10 MG PO TABS: ORAL_TABLET | ORAL | 3 refills | Status: DC

## 2023-06-20 ENCOUNTER — Other Ambulatory Visit: Payer: Self-pay | Admitting: Family

## 2023-06-20 DIAGNOSIS — F419 Anxiety disorder, unspecified: Secondary | ICD-10-CM

## 2023-06-20 MED ORDER — ESCITALOPRAM OXALATE 10 MG PO TABS
ORAL_TABLET | ORAL | 3 refills | Status: AC
Start: 2023-06-20 — End: ?

## 2023-07-09 ENCOUNTER — Other Ambulatory Visit: Payer: Self-pay | Admitting: Family

## 2023-07-09 DIAGNOSIS — E039 Hypothyroidism, unspecified: Secondary | ICD-10-CM

## 2023-07-18 ENCOUNTER — Telehealth: Payer: Self-pay

## 2023-07-18 ENCOUNTER — Other Ambulatory Visit (HOSPITAL_COMMUNITY): Payer: Self-pay

## 2023-07-18 NOTE — Telephone Encounter (Signed)
 Pharmacy Patient Advocate Encounter   Received notification from CoverMyMeds that prior authorization for Trulicity 3MG /0.5ML auto-injectors is required/requested.   Insurance verification completed.   The patient is insured through [facility] .   Per test claim: PA required; PA submitted to above mentioned insurance via CoverMyMeds Key/confirmation #/EOC (Key: [id number])   Status is pending

## 2023-07-19 ENCOUNTER — Other Ambulatory Visit (HOSPITAL_COMMUNITY): Payer: Self-pay

## 2023-07-19 NOTE — Telephone Encounter (Signed)
 Pharmacy Patient Advocate Encounter  Received notification from [facility] that Prior Authorization for Trulicity 3MG /0.5ML auto-injectors has been APPROVED from 4.3.25 to 4.3.26. Ran test claim, Copay is $25.00. This test claim was processed through [facility]- copay amounts may vary at other pharmacies due to pharmacy/plan contracts, or as the patient moves through the different stages of their insurance plan.   PA #/Case ID/Reference #: (Key: [id number])

## 2023-07-23 ENCOUNTER — Other Ambulatory Visit: Payer: Self-pay

## 2023-07-23 ENCOUNTER — Ambulatory Visit
Admission: EM | Admit: 2023-07-23 | Discharge: 2023-07-23 | Disposition: A | Discharge status: Home / Self Care | Attending: Emergency Medicine | Admitting: Emergency Medicine

## 2023-07-23 ENCOUNTER — Encounter: Payer: Self-pay | Admitting: Emergency Medicine

## 2023-07-23 DIAGNOSIS — H6502 Acute serous otitis media, left ear: Secondary | ICD-10-CM

## 2023-07-23 MED ORDER — AMOXICILLIN-POT CLAVULANATE 875-125 MG PO TABS: 1.0000 | ORAL_TABLET | Freq: Two times a day (BID) | ORAL | 0 refills | Status: DC

## 2023-07-23 NOTE — ED Triage Notes (Signed)
 Patient presents to [facility] for evaluation of left ear pain since yesterday.  Says her ear is sensitive to the touch, hurts when she opens her jaw, and just feels "full".

## 2023-07-23 NOTE — Discharge Instructions (Signed)
 Today you are being treated for an infection of the eardrum  Take augmentin  twice daily for 7 days, you should begin to see improvement after 48 hours of medication use and then it should progressively get better  You may use Tylenol or ibuprofen for management of discomfort  May hold warm compresses to the ear for additional comfort  Please not attempted any ear cleaning or object or fluid placement into the ear canal to prevent further irritation

## 2023-07-23 NOTE — ED Provider Notes (Signed)
 Lindsay Mejia    CSN: [id number] Arrival date & time: 07/23/23  1607      History   Chief Complaint Chief Complaint  Patient presents with   Ear Pain    HPI Lindsay Mejia is a 64 y.o. female.   Patient presents for evaluation of ear pain beginning today.  Radiating into the left jaw and the ear sensitive to touch.  Denies fever, congestion, ear drainage or decreased hearing.  Denies injury or trauma to the ear.  Has not attempted treatment.  Past Medical History:  Diagnosis Date   Anxiety    Diabetes mellitus without complication (HCC)    Hyperlipidemia    Hypertension    Hypothyroidism    Rosacea    Dr. Wende Neighbors   Thyroid disease 2000   s/p XRT to thyroid, Dr. Hyacinth Meeker at [facility]    Patient Active Problem List   Diagnosis Date Noted   Melanoma of skin (HCC) 12/12/2021   Vertigo 10/15/2018   Chest tightness 04/11/2018   Anxiety and depression 07/26/2017   HLD (hyperlipidemia) 07/25/2016   Herpes labialis 04/18/2016   Rosacea 09/16/2012   Overweight (BMI 25.0-29.9) 08/06/2012   Diabetes type 2, controlled (HCC) 06/26/2012   Essential hypertension, benign 06/26/2012   Hypothyroidism 09/03/2011    Past Surgical History:  Procedure Laterality Date   ABDOMINAL HYSTERECTOMY     has one ovary    APPENDECTOMY     BREAST BIOPSY Right 2006   neg   BREAST SURGERY     CESAREAN SECTION     4, Dr. Weston Anna   COLONOSCOPY WITH PROPOFOL N/A 12/24/2022   Procedure: COLONOSCOPY WITH PROPOFOL;  Surgeon: Jaynie Collins, DO;  Location: [facility] ENDOSCOPY;  Service: Gastroenterology;  Laterality: N/A;   HERNIA REPAIR     abdominal   KNEE ARTHROSCOPY W/ MENISCAL REPAIR     2    OB History     Gravida  4   Para  4   Term      Preterm      AB      Living         SAB      IAB      Ectopic      Multiple      Live Births               Home Medications    Prior to Admission medications   Medication Sig Start Date End Date Taking?  Authorizing Provider  amoxicillin-clavulanate (AUGMENTIN) 875-125 MG tablet Take 1 tablet by mouth every 12 (twelve) hours. 07/23/23  Yes Yama Nielson, Elita Boone, NP  escitalopram (LEXAPRO) 10 MG tablet TAKE 1 TABLET BY MOUTH DAILY 06/20/23   Allegra Grana, FNP  estradiol (ESTRACE VAGINAL) 0.1 MG/GM vaginal cream Insert 1 g vaginally nightly for 1 week, then once wkly as maintenance 04/29/23   Copland, Alicia B, PA-C  levothyroxine (SYNTHROID) 75 MCG tablet TAKE 1 TABLET BY MOUTH EVERY MORNING 07/09/23   Allegra Grana, FNP  lisinopril-hydrochlorothiazide (ZESTORETIC) 10-12.5 MG tablet Take 1 tablet by mouth daily. 06/19/23   Allegra Grana, FNP  Multiple Vitamin (MULTIVITAMIN) tablet Take 1 tablet by mouth daily.    [provider]  rosuvastatin (CRESTOR) 10 MG tablet TAKE 1 TABLET BY MOUTH DAILY 06/19/23   Allegra Grana, FNP  TRULICITY 3 MG/0.5ML SOAJ INJECT 3 MG UNDER THE SKIN ONCE WEEKLY 05/17/23   Allegra Grana, FNP  valACYclovir (VALTREX) 1000 MG tablet TAKE 2  TABLETS(2000 MG) BY MOUTH EVERY 12 HOURS FOR 1 DAY. START: ASAP AFTER SYMPTOM ONSET 02/07/23   Allegra Grana, FNP    Family History Family History  Problem Relation Age of Onset   Hyperlipidemia Mother    Hypertension Mother    Breast cancer Mother 72   Colon polyps Mother    Hypertension Father    Heart disease Father    Heart attack Father 39   Hypertension Brother    Breast cancer Paternal Grandmother        later in life   Colon cancer Neg Hx    Ovarian cancer Neg Hx    Thyroid cancer Neg Hx     Social History Social History   Tobacco Use   Smoking status: Never   Smokeless tobacco: Never  Vaping Use   Vaping status: Never Used  Substance Use Topics   Alcohol use: Yes    Comment: occasionally   Drug use: No     Allergies   Patient has no known allergies.   Review of Systems Review of Systems   Physical Exam Triage Vital Signs ED Triage Vitals  Encounter Vitals Group     BP  07/23/23 1614 114/66     Systolic BP Percentile --      Diastolic BP Percentile --      Pulse Rate 07/23/23 1614 69     Resp 07/23/23 1614 18     Temp 07/23/23 1614 98.9 F (37.2 C)     Temp Source 07/23/23 1614 Oral     SpO2 07/23/23 1614 98 %     Weight --      Height --      Head Circumference --      Peak Flow --      Pain Score 07/23/23 1615 2     Pain Loc --      Pain Education --      Exclude from Growth Chart --    No data found.  Updated Vital Signs BP 114/66 (BP Location: Left Arm)   Pulse 69   Temp 98.9 F (37.2 C) (Oral)   Resp 18   SpO2 98%   Visual Acuity Right Eye Distance:   Left Eye Distance:   Bilateral Distance:    Right Eye Near:   Left Eye Near:    Bilateral Near:     Physical Exam Constitutional:      Appearance: Normal appearance.  HENT:     Right Ear: Hearing, tympanic membrane, ear canal and external ear normal.     Left Ear: Tympanic membrane is erythematous.  Eyes:     Extraocular Movements: Extraocular movements intact.  Pulmonary:     Effort: Pulmonary effort is normal.  Neurological:     Mental Status: She is alert and oriented to person, place, and time.      UC Treatments / Results  Labs (all labs ordered are listed, but only abnormal results are displayed) Labs Reviewed - No data to display  EKG   Radiology No results found.  Procedures Procedures (including critical care time)  Medications Ordered in UC Medications - No data to display  Initial Impression / Assessment and Plan / UC Course  I have reviewed the triage vital signs and the nursing notes.  Pertinent labs & imaging results that were available during my care of the patient were reviewed by me and considered in my medical decision making (see chart for details).  Nonrecurrent acute serous otitis media of  left ear  Erythema to the tympanic membrane is consistent with infection, congestion to the nasal turbinates otherwise stable exam, prescribed  Augmentin, advised against ear cleaning, may use over-the-counter analgesics and warm compresses to the external ear for comfort, may follow-up if symptoms persist worsen or recur  Final Clinical Impressions(s) / UC Diagnoses   Final diagnoses:  Non-recurrent acute serous otitis media of left ear     Discharge Instructions      Today you are being treated for an infection of the eardrum  Take augmentin twice daily for 7 days, you should begin to see improvement after 48 hours of medication use and then it should progressively get better  You may use Tylenol or ibuprofen for management of discomfort  May hold warm compresses to the ear for additional comfort  Please not attempted any ear cleaning or object or fluid placement into the ear canal to prevent further irritation    ED Prescriptions     Medication Sig Dispense Auth. Provider   amoxicillin-clavulanate (AUGMENTIN) 875-125 MG tablet Take 1 tablet by mouth every 12 (twelve) hours. 14 tablet Alzora Ha, Elita Boone, NP      PDMP not reviewed this encounter.   Valinda Hoar, NP 07/23/23 1622

## 2023-09-11 ENCOUNTER — Telehealth: Payer: Self-pay

## 2023-09-11 NOTE — Telephone Encounter (Signed)
 Copied from CRM [id number]. Topic: Appointments - Scheduling Inquiry for Clinic >> Sep 11, 2023  2:26 PM Chuck Crater wrote: Reason for CRM: Patient wants to schedule for an Annual Wellness visit for sometime in July. It did not allow me to change patient insurance and schedule for awv, Only a physical popped up.  I left a voicemail for patient asking her to please call us  back so we can assist her with scheduling her appointment.

## 2023-09-11 NOTE — Telephone Encounter (Signed)
 Patient called back via E2C2.  Patient states she wants to schedule her [facility] appointment.  Patient states she will be changing her insurance to Medicare on 10/15/2023 and she would like to have her appointment in the second week of July.  I scheduled her [facility] appointment on 10/23/2023.

## 2023-09-14 ENCOUNTER — Other Ambulatory Visit: Payer: Self-pay | Admitting: Family

## 2023-09-14 DIAGNOSIS — E119 Type 2 diabetes mellitus without complications: Secondary | ICD-10-CM

## 2023-10-05 ENCOUNTER — Other Ambulatory Visit: Payer: Self-pay | Admitting: Family

## 2023-10-05 DIAGNOSIS — E039 Hypothyroidism, unspecified: Secondary | ICD-10-CM

## 2023-10-14 ENCOUNTER — Other Ambulatory Visit: Payer: Self-pay | Admitting: Family

## 2023-10-14 DIAGNOSIS — I10 Essential (primary) hypertension: Secondary | ICD-10-CM

## 2023-10-16 NOTE — Addendum Note (Signed)
 Addended by: Railynn Ballo on: 10/16/2023 11:44 AM   Modules accepted: Orders

## 2023-10-21 DIAGNOSIS — E119 Type 2 diabetes mellitus without complications: Secondary | ICD-10-CM | POA: Diagnosis not present

## 2023-10-23 ENCOUNTER — Ambulatory Visit (INDEPENDENT_AMBULATORY_CARE_PROVIDER_SITE_OTHER): Admitting: Family

## 2023-10-23 ENCOUNTER — Encounter: Payer: Self-pay | Admitting: Family

## 2023-10-23 VITALS — BP 120/78 | HR 63 | Temp 97.9°F | Ht 63.0 in | Wt 131.6 lb

## 2023-10-23 DIAGNOSIS — Z1231 Encounter for screening mammogram for malignant neoplasm of breast: Secondary | ICD-10-CM

## 2023-10-23 DIAGNOSIS — E039 Hypothyroidism, unspecified: Secondary | ICD-10-CM

## 2023-10-23 DIAGNOSIS — E119 Type 2 diabetes mellitus without complications: Secondary | ICD-10-CM | POA: Diagnosis not present

## 2023-10-23 DIAGNOSIS — Z7985 Long-term (current) use of injectable non-insulin antidiabetic drugs: Secondary | ICD-10-CM

## 2023-10-23 DIAGNOSIS — E782 Mixed hyperlipidemia: Secondary | ICD-10-CM

## 2023-10-23 DIAGNOSIS — Z0000 Encounter for general adult medical examination without abnormal findings: Secondary | ICD-10-CM | POA: Diagnosis not present

## 2023-10-23 LAB — LIPID PANEL
Cholesterol: 125 mg/dL (ref 0–200)
HDL: 58.9 mg/dL
LDL Cholesterol: 53 mg/dL (ref 0–99)
NonHDL: 65.65
Total CHOL/HDL Ratio: 2
Triglycerides: 64 mg/dL (ref 0.0–149.0)
VLDL: 12.8 mg/dL (ref 0.0–40.0)

## 2023-10-23 LAB — HEMOGLOBIN A1C: Hgb A1c MFr Bld: 5.6 % (ref 4.6–6.5)

## 2023-10-23 LAB — COMPREHENSIVE METABOLIC PANEL WITH GFR
ALT: 19 U/L (ref 0–35)
AST: 22 U/L (ref 0–37)
Albumin: 4.5 g/dL (ref 3.5–5.2)
Alkaline Phosphatase: 56 U/L (ref 39–117)
BUN: 19 mg/dL (ref 6–23)
CO2: 34 meq/L — ABNORMAL HIGH (ref 19–32)
Calcium: 9.4 mg/dL (ref 8.4–10.5)
Chloride: 102 meq/L (ref 96–112)
Creatinine, Ser: 0.75 mg/dL (ref 0.40–1.20)
GFR: 83.82 mL/min (ref >60.00)
Glucose, Bld: 87 mg/dL (ref 70–99)
Potassium: 3.7 meq/L (ref 3.5–5.1)
Sodium: 141 meq/L (ref 135–145)
Total Bilirubin: 1 mg/dL (ref 0.2–1.2)
Total Protein: 7 g/dL (ref 6.0–8.3)

## 2023-10-23 LAB — MICROALBUMIN / CREATININE URINE RATIO
Creatinine,U: 123.9 mg/dL
Microalb Creat Ratio: 7.3 mg/g (ref 0.0–30.0)
Microalb, Ur: 0.9 mg/dL (ref 0.0–1.9)

## 2023-10-23 LAB — TSH: TSH: 0.22 u[IU]/mL — ABNORMAL LOW (ref 0.35–5.50)

## 2023-10-23 MED ORDER — TIRZEPATIDE 2.5 MG/0.5ML ~~LOC~~ SOAJ: 2.5000 mg | SUBCUTANEOUS | 1 refills | Status: DC

## 2023-10-23 NOTE — Progress Notes (Signed)
 No complaints today.  She feels well. For exercise, she dances, works in the yard, 2 miles daily.   Denies cp, sob, leg swelling.  Eats healthy diet.   She is following low glycemic diet.  Compliant with trulicity  however weight and glucose was better controlled with mounjaro ; she had to change d/t insurance.   Health Risk Assessment:   Reports able to perform activities of daily living including dressing , feeding, bathing, and toileting.     Reports able to perform instrumental activities of daily living including balancing checkbook, shopping, housekeeping, managing medications, and laundry.   Feels safe at home. Smoke detectors and co2 detectors in place.   No concerns for hearing impairment.  No concerns for vision impairment.    Advanced directives: Discussed living will, medical power of attorney which she has at home.   Denies alcohol or drug use . Non smoker     Mini Mental Status Exam:     No data to display           Flowsheet Row Office Visit from 08/14/2022 in [facility]  AUDIT-C Score 1   Vision Screening   Right eye Left eye Both eyes  Without correction 0 0 0  With correction 20/20 20/20 20/20      Depression screen:     10/23/2023   10:25 AM 02/07/2023    4:09 PM 08/14/2022    9:09 AM 04/25/2022    8:14 AM 12/12/2021    9:05 AM  Depression screen PHQ 2/9  Decreased Interest 0 0 0 0 0  Down, Depressed, Hopeless 0 0 0 0 0  PHQ - 2 Score 0 0 0 0 0  Altered sleeping  0     Tired, decreased energy  0     Change in appetite  0     Feeling bad or failure about yourself   0     Trouble concentrating  0     Moving slowly or fidgety/restless  0     Suicidal thoughts  0     PHQ-9 Score  0     Difficult doing work/chores  Not difficult at all         02/07/2023    4:09 PM 12/12/2021    9:05 AM  GAD 7 : Generalized Anxiety Score  Nervous, Anxious, on Edge 0 0  Control/stop worrying 0 0  Worry too much -  different things 0 0  Trouble relaxing 0 0  Restless 0 0  Easily annoyed or irritable 0 0  Afraid - awful might happen 0 0  Total GAD 7 Score 0 0  Anxiety Difficulty Not difficult at all Not difficult at all      Past Medical History:  Diagnosis Date   Anxiety    Diabetes mellitus without complication (HCC)    Hyperlipidemia    Hypertension    Hypothyroidism    Rosacea    Dr. Klineman   Thyroid  disease 2000   s/p XRT to thyroid , Dr. Cleotilde at [place]   Past Surgical History:  Procedure Laterality Date   ABDOMINAL HYSTERECTOMY     has one ovary    APPENDECTOMY     BREAST BIOPSY Right 2006   neg   BREAST SURGERY     CESAREAN SECTION     4, Dr. Bethene   COLONOSCOPY WITH PROPOFOL  N/A 12/24/2022   Procedure: COLONOSCOPY WITH PROPOFOL ;  Surgeon: Onita Elspeth Sharper, DO;  Location: [facility] ENDOSCOPY;  Service:  Gastroenterology;  Laterality: N/A;   HERNIA REPAIR     abdominal   KNEE ARTHROSCOPY W/ MENISCAL REPAIR     2   Family History  Problem Relation Age of Onset   Hyperlipidemia Mother    Hypertension Mother    Breast cancer Mother 55   Colon polyps Mother    Hypertension Father    Heart disease Father    Heart attack Father 30   Hypertension Brother    Breast cancer Paternal Grandmother        later in life   Colon cancer Neg Hx    Ovarian cancer Neg Hx    Thyroid  cancer Neg Hx      Current Medical providers:  Primary Care: Mayu Ronk,NP GYN: Bernarda Schroeder, PA Dermatology: Dr Chrystie  Today's Vitals   10/23/23 1021  BP: 120/78  Pulse: 63  Temp: 97.9 F (36.6 C)  TempSrc: Oral  SpO2: 99%  Weight: 131 lb 9.6 oz (59.7 kg)  Height: 5' 3 (1.6 m)   Wt Readings from Last 3 Encounters:  10/23/23 131 lb 9.6 oz (59.7 kg)  04/29/23 127 lb (57.6 kg)  02/07/23 128 lb 6.4 oz (58.2 kg)    Body mass index is 23.31 kg/m. Physical Exam   Constitutional: Appears well developed and well nourished.  Eyes: Conjunctivae are normal Neck: No thyroid   mass and no thyromegaly present CV: Normal rate, regular rhythm, normal heart sounds and normal pulses Pulmonary/Chest: Effort normal and breath sounds normal. No wheezes, rhonchi, rales.  Neurological: Alert Skin: Skin is warm and dry Psychiatric: Normal mood and affect. Speech is normal and behavior is normal.  Thought content normal.  Problem List Items Addressed This Visit       Endocrine   Diabetes type 2, controlled (HCC) (Chronic)   Chronic, excellent control. Patient prefers mounjaro ; dc trulicity .        Relevant Medications   tirzepatide  (MOUNJARO ) 2.5 MG/0.5ML Pen   Other Relevant Orders   Hemoglobin A1c   Hypothyroidism   Relevant Orders   TSH     Other   HLD (hyperlipidemia)   Relevant Orders   Comprehensive metabolic panel with GFR   Lipid panel   Welcome to Medicare preventive visit    Medications reviewed, patient not on opioids.   Baseline EKG without significant changes. T wave flattening is unchanged from prior EKGs 04/11/2018, 09/09/2013.   Screenings administered: Fall screening, depression scale ( PHQ9) , anxiety scale ( GAD 7 ), vision screening, MMSE 30/30, Alcohol use disorder ( AUDIT)   No cognitive impairment assessed by observation. Assessed risk of fall, depression. Counseled on importance of end of life planning by discussing   healthcare power of attorney and living will and completion of standard forms. She has completed the forms at home.  Patient encouraged to bring this to office so we can add to chart.  Encouraged continued exercise.   Counseled on fall prevention, nutrition, physical activity,cognition.         Other Visit Diagnoses       Medicare welcome visit    -  Primary   Relevant Orders   EKG 12-Lead (Completed)     Encounter for screening mammogram for malignant neoplasm of breast       Relevant Orders   MM 3D SCREENING MAMMOGRAM BILATERAL BREAST

## 2023-10-23 NOTE — Assessment & Plan Note (Addendum)
  Medications reviewed, patient not on opioids.   Baseline EKG without significant changes. T wave flattening is unchanged from prior EKGs 04/11/2018, 09/09/2013.   Screenings administered: Fall screening, depression scale ( PHQ9) , anxiety scale ( GAD 7 ), vision screening, MMSE 30/30, Alcohol use disorder ( AUDIT)   No cognitive impairment assessed by observation. Assessed risk of fall, depression. Counseled on importance of end of life planning by discussing   healthcare power of attorney and living will and completion of standard forms. She has completed the forms at home.  Patient encouraged to bring this to office so we can add to chart.  Encouraged continued exercise.   Counseled on fall prevention, nutrition, physical activity,cognition.

## 2023-10-23 NOTE — Assessment & Plan Note (Signed)
 Chronic, excellent control. Patient prefers mounjaro ; dc trulicity .

## 2023-10-23 NOTE — Patient Instructions (Signed)
  Thank you for taking time to come for your Medicare Wellness Visit. I appreciate your ongoing commitment to your health goals.   Please call  and schedule your 3D mammogram and /or bone density scan as we discussed.   [facility]  ( new location in 2023)  [address], [place], [place] [address]  [place], [place]  [phone]

## 2023-10-24 ENCOUNTER — Ambulatory Visit: Payer: Self-pay | Admitting: Family

## 2023-10-24 ENCOUNTER — Other Ambulatory Visit: Payer: Self-pay | Admitting: Family

## 2023-10-24 DIAGNOSIS — R899 Unspecified abnormal finding in specimens from other organs, systems and tissues: Secondary | ICD-10-CM

## 2023-10-28 ENCOUNTER — Telehealth: Payer: Self-pay

## 2023-10-28 ENCOUNTER — Other Ambulatory Visit (HOSPITAL_COMMUNITY): Payer: Self-pay

## 2023-10-28 NOTE — Telephone Encounter (Signed)
 Copied from CRM [id number]. Topic: Clinical - Medication Prior Auth >> Oct 28, 2023 11:36 AM Chiquita SQUIBB wrote: Reason for CRM:  Jolaine from [facility] is calling to let the provider know that the patient has been approved for the tirzepatide  (MOUNJARO ) 2.5 MG/0.5ML Pen  for July 14th 2025- July 14th 2026. They will also fax the approval as well.

## 2023-10-28 NOTE — Telephone Encounter (Signed)
 Pharmacy Patient Advocate Encounter   Received notification from CoverMyMeds that prior authorization for Mounjaro  2.5 is required/requested.   Insurance verification completed.   The patient is insured through [facility] .   Per test claim: PA required; PA submitted to above mentioned insurance via CoverMyMeds Key/confirmation #/EOC [id number] Status is pending

## 2023-10-28 NOTE — Telephone Encounter (Signed)
 Pharmacy Patient Advocate Encounter  Received notification from [facility] that Prior Authorization for Mounjaro  2.5 has been APPROVED from 10/28/23 to 10/27/24. Ran test claim, Copay is $45.00. This test claim was processed through [facility]- copay amounts may vary at other pharmacies due to pharmacy/plan contracts, or as the patient moves through the different stages of their insurance plan.   PA #/Case ID/Reference #: [id number]

## 2023-10-28 NOTE — Telephone Encounter (Signed)
 noted

## 2023-10-29 NOTE — Telephone Encounter (Signed)
 Pt is aware.

## 2023-10-29 NOTE — Telephone Encounter (Signed)
 Lvm to inform pt of message below, sent my chart message as well

## 2023-10-29 NOTE — Telephone Encounter (Signed)
 Error

## 2023-10-31 DIAGNOSIS — D2261 Melanocytic nevi of right upper limb, including shoulder: Secondary | ICD-10-CM | POA: Diagnosis not present

## 2023-10-31 DIAGNOSIS — D2272 Melanocytic nevi of left lower limb, including hip: Secondary | ICD-10-CM | POA: Diagnosis not present

## 2023-10-31 DIAGNOSIS — D225 Melanocytic nevi of trunk: Secondary | ICD-10-CM | POA: Diagnosis not present

## 2023-10-31 DIAGNOSIS — D2262 Melanocytic nevi of left upper limb, including shoulder: Secondary | ICD-10-CM | POA: Diagnosis not present

## 2023-10-31 DIAGNOSIS — L570 Actinic keratosis: Secondary | ICD-10-CM | POA: Diagnosis not present

## 2023-11-13 ENCOUNTER — Other Ambulatory Visit: Payer: Self-pay | Admitting: Family

## 2023-11-13 DIAGNOSIS — E119 Type 2 diabetes mellitus without complications: Secondary | ICD-10-CM

## 2023-11-13 MED ORDER — TIRZEPATIDE 5 MG/0.5ML ~~LOC~~ SOAJ: 5.0000 mg | SUBCUTANEOUS | 2 refills | Status: DC

## 2023-12-10 ENCOUNTER — Ambulatory Visit
Admission: RE | Admit: 2023-12-10 | Discharge: 2023-12-10 | Disposition: A | Discharge status: Home / Self Care | Source: Ambulatory Visit | Attending: Family | Admitting: Family

## 2023-12-10 DIAGNOSIS — Z1231 Encounter for screening mammogram for malignant neoplasm of breast: Secondary | ICD-10-CM | POA: Diagnosis not present

## 2023-12-31 ENCOUNTER — Encounter: Payer: Self-pay | Admitting: Family

## 2023-12-31 MED ORDER — TIRZEPATIDE 7.5 MG/0.5ML ~~LOC~~ SOAJ: 7.5000 mg | SUBCUTANEOUS | 2 refills | Status: AC

## 2023-12-31 NOTE — Telephone Encounter (Signed)
 NEW RX SENT IN PT WILL CALL AND SCHEDULE 3-4 MNTH  APPT

## 2023-12-31 NOTE — Telephone Encounter (Signed)
 Pt requesting next dosage be sent to [facility]

## 2024-01-05 ENCOUNTER — Other Ambulatory Visit: Payer: Self-pay | Admitting: Family

## 2024-01-05 DIAGNOSIS — E039 Hypothyroidism, unspecified: Secondary | ICD-10-CM

## 2024-01-14 ENCOUNTER — Telehealth: Payer: Self-pay | Admitting: *Deleted

## 2024-01-14 NOTE — Telephone Encounter (Signed)
 Copied from CRM #[id number]. Topic: General - Other >> Jan 14, 2024 11:25 AM Mesmerise C wrote: Reason for CRM: Programmer, systems from [employer] advised hasn't been able to get a hold of patient regarding her rosuvastatin  (CRESTOR ) 10 MG tablet and hasn't been refilled inquiring if patient can be reached to see if she's still taking it

## 2024-01-14 NOTE — Telephone Encounter (Signed)
 Spoke to pt she stated that she is still taking the Rosuvastatin  (Crestor  10 mg)

## 2024-01-22 DIAGNOSIS — K080 Exfoliation of teeth due to systemic causes: Secondary | ICD-10-CM | POA: Diagnosis not present

## 2024-01-24 ENCOUNTER — Other Ambulatory Visit

## 2024-01-24 ENCOUNTER — Other Ambulatory Visit (INDEPENDENT_AMBULATORY_CARE_PROVIDER_SITE_OTHER)

## 2024-01-24 DIAGNOSIS — R946 Abnormal results of thyroid function studies: Secondary | ICD-10-CM

## 2024-01-24 DIAGNOSIS — R899 Unspecified abnormal finding in specimens from other organs, systems and tissues: Secondary | ICD-10-CM

## 2024-01-24 LAB — TSH: TSH: 0.78 u[IU]/mL (ref 0.35–5.50)

## 2024-01-24 LAB — T3, FREE: T3, Free: 2.9 pg/mL (ref 2.3–4.2)

## 2024-01-24 LAB — T4, FREE: Free T4: 1.36 ng/dL (ref 0.60–1.60)

## 2024-01-27 ENCOUNTER — Ambulatory Visit: Payer: Self-pay | Admitting: Family

## 2024-02-19 ENCOUNTER — Other Ambulatory Visit: Payer: Self-pay | Admitting: Family

## 2024-02-19 DIAGNOSIS — B001 Herpesviral vesicular dermatitis: Secondary | ICD-10-CM

## 2024-04-11 ENCOUNTER — Other Ambulatory Visit: Payer: Self-pay | Admitting: Family

## 2024-04-11 DIAGNOSIS — E039 Hypothyroidism, unspecified: Secondary | ICD-10-CM

## 2024-04-18 ENCOUNTER — Other Ambulatory Visit: Payer: Self-pay | Admitting: Family

## 2024-04-18 DIAGNOSIS — E119 Type 2 diabetes mellitus without complications: Secondary | ICD-10-CM

## 2024-04-23 ENCOUNTER — Other Ambulatory Visit: Payer: Self-pay | Admitting: Family

## 2024-04-23 DIAGNOSIS — I10 Essential (primary) hypertension: Secondary | ICD-10-CM

## 2024-04-29 ENCOUNTER — Encounter: Payer: Self-pay | Admitting: Family

## 2024-05-08 ENCOUNTER — Other Ambulatory Visit (HOSPITAL_COMMUNITY): Payer: Self-pay

## 2024-05-18 ENCOUNTER — Other Ambulatory Visit (HOSPITAL_COMMUNITY): Payer: Self-pay

## 2024-05-20 ENCOUNTER — Other Ambulatory Visit (HOSPITAL_COMMUNITY): Payer: Self-pay

## 2024-05-20 ENCOUNTER — Telehealth: Payer: Self-pay

## 2024-05-20 NOTE — Telephone Encounter (Signed)
 Pharmacy Patient Advocate Encounter   Received notification from [facility] that prior authorization for Mounjaro  7.5MG /0.5ML auto-injectors  is required/requested.   Insurance verification completed.   The patient is insured through [employer].   Per test claim: PA required; PA submitted to above mentioned insurance via Latent Key/confirmation #/EOC [id number] Status is pending

## 2024-05-22 ENCOUNTER — Other Ambulatory Visit (HOSPITAL_COMMUNITY): Payer: Self-pay

## 2024-05-22 NOTE — Telephone Encounter (Signed)
 LVM and sent pt my chart message to inform her of approval of Mounjaro 

## 2024-05-22 NOTE — Telephone Encounter (Signed)
 Pharmacy Patient Advocate Encounter  Received notification from [employer] that Prior Authorization for Mounjaro  7.5MG /0.5ML auto-injectors has been APPROVED from 04/16/2024 to 04/15/2025. Unable to obtain price due to refill too soon rejection, last fill date 05/08/2024 next available fill date 06/01/2024   PA #/Case ID/Reference #: [id number]

## 2024-05-28 ENCOUNTER — Ambulatory Visit: Admitting: Obstetrics and Gynecology
